# Patient Record
Sex: Male | Born: 1945 | Race: White | Hispanic: No | Marital: Single | Smoking: Current every day smoker
Health system: Southern US, Community
[De-identification: ages and names within clinical notes are randomized; demographics above are authoritative.]

## PROBLEM LIST (undated history)

## (undated) DIAGNOSIS — K219 Gastro-esophageal reflux disease without esophagitis: Secondary | ICD-10-CM

## (undated) DIAGNOSIS — N529 Male erectile dysfunction, unspecified: Secondary | ICD-10-CM

## (undated) DIAGNOSIS — Z8719 Personal history of other diseases of the digestive system: Secondary | ICD-10-CM

## (undated) DIAGNOSIS — I499 Cardiac arrhythmia, unspecified: Secondary | ICD-10-CM

## (undated) DIAGNOSIS — E785 Hyperlipidemia, unspecified: Secondary | ICD-10-CM

## (undated) DIAGNOSIS — C801 Malignant (primary) neoplasm, unspecified: Secondary | ICD-10-CM

## (undated) DIAGNOSIS — M1990 Unspecified osteoarthritis, unspecified site: Secondary | ICD-10-CM

## (undated) DIAGNOSIS — M419 Scoliosis, unspecified: Secondary | ICD-10-CM

## (undated) HISTORY — DX: Unspecified osteoarthritis, unspecified site: M19.90

## (undated) HISTORY — DX: Gastro-esophageal reflux disease without esophagitis: K21.9

## (undated) HISTORY — PX: TONSILLECTOMY: SUR1361

## (undated) HISTORY — DX: Malignant (primary) neoplasm, unspecified: C80.1

## (undated) HISTORY — DX: Cardiac arrhythmia, unspecified: I49.9

## (undated) HISTORY — DX: Male erectile dysfunction, unspecified: N52.9

## (undated) HISTORY — DX: Hyperlipidemia, unspecified: E78.5

## (undated) HISTORY — DX: Scoliosis, unspecified: M41.9

---

## 1989-06-25 HISTORY — PX: HERNIA REPAIR: SHX51

## 1999-07-03 ENCOUNTER — Encounter: Admission: RE | Admit: 1999-07-03 | Discharge: 1999-07-03 | Payer: Self-pay | Admitting: Family Medicine

## 1999-07-03 ENCOUNTER — Encounter: Payer: Self-pay | Admitting: Family Medicine

## 2004-09-18 ENCOUNTER — Ambulatory Visit: Payer: Self-pay | Admitting: Gastroenterology

## 2004-10-10 HISTORY — PX: COLONOSCOPY: SHX174

## 2004-10-12 ENCOUNTER — Ambulatory Visit: Payer: Self-pay | Admitting: Gastroenterology

## 2004-10-12 HISTORY — PX: COLONOSCOPY: SHX174

## 2011-08-09 ENCOUNTER — Encounter: Payer: Self-pay | Admitting: Gastroenterology

## 2014-07-23 ENCOUNTER — Encounter: Payer: Self-pay | Admitting: Gastroenterology

## 2014-07-29 ENCOUNTER — Encounter: Payer: Self-pay | Admitting: Gastroenterology

## 2014-09-27 ENCOUNTER — Ambulatory Visit (AMBULATORY_SURGERY_CENTER): Payer: Self-pay | Admitting: *Deleted

## 2014-09-27 VITALS — Ht 69.0 in | Wt 168.8 lb

## 2014-09-27 DIAGNOSIS — Z1211 Encounter for screening for malignant neoplasm of colon: Secondary | ICD-10-CM

## 2014-09-27 MED ORDER — NA SULFATE-K SULFATE-MG SULF 17.5-3.13-1.6 GM/177ML PO SOLN: 1.0000 | Freq: Once | ORAL | Status: DC

## 2014-09-27 NOTE — Progress Notes (Signed)
No home 02 use No egg or soy allergy No issues with past sedation except post op N/V No diet pills emmi video to e mail

## 2014-10-11 ENCOUNTER — Encounter: Payer: Self-pay | Admitting: Gastroenterology

## 2014-10-11 ENCOUNTER — Ambulatory Visit (AMBULATORY_SURGERY_CENTER): Payer: Medicare HMO | Admitting: Gastroenterology

## 2014-10-11 VITALS — BP 110/78 | HR 63 | Temp 96.5°F | Resp 18

## 2014-10-11 DIAGNOSIS — D124 Benign neoplasm of descending colon: Secondary | ICD-10-CM

## 2014-10-11 DIAGNOSIS — K5730 Diverticulosis of large intestine without perforation or abscess without bleeding: Secondary | ICD-10-CM | POA: Diagnosis not present

## 2014-10-11 DIAGNOSIS — Z1211 Encounter for screening for malignant neoplasm of colon: Secondary | ICD-10-CM | POA: Diagnosis not present

## 2014-10-11 MED ORDER — SODIUM CHLORIDE 0.9 % IV SOLN: 500.0000 mL | INTRAVENOUS | Status: DC

## 2014-10-11 NOTE — Progress Notes (Signed)
Report to PACU, RN, vss, BBS= Clear.  

## 2014-10-11 NOTE — Progress Notes (Signed)
Called to room to assist during endoscopic procedure.  Patient ID and intended procedure confirmed with present staff. Received instructions for my participation in the procedure from the performing physician.  

## 2014-10-11 NOTE — Op Note (Signed)
[place]  [address]. [place], [address]   COLONOSCOPY PROCEDURE REPORT  PATIENT: Rubel, Heckard  MR#: [id number] BIRTHDATE: [birth year] , 68  yrs. old GENDER: male ENDOSCOPIST: Inda Castle, MD REFERRED BY: PROCEDURE DATE:  10/11/2014 PROCEDURE:   Colonoscopy, screening and Colonoscopy with snare polypectomy First Screening Colonoscopy - Avg.  risk and is 50 yrs.  old or older - No.  Prior Negative Screening - Now for repeat screening. 10 or more years since last screening  History of Adenoma - Now for follow-up colonoscopy & has been > or = to 3 yrs.  N/A ASA CLASS:   Class II INDICATIONS:Colorectal Neoplasm Risk Assessment for this procedure is average risk. MEDICATIONS: Monitored anesthesia care and Propofol 200 mg IV  DESCRIPTION OF PROCEDURE:   After the risks benefits and alternatives of the procedure were thoroughly explained, informed consent was obtained.  The digital rectal exam revealed external hemorrhoids.   The [id number]  endoscope was introduced through the anus and advanced to the cecum, which was identified by both the appendix and ileocecal valve. No adverse events experienced.   The quality of the prep was (Suprep was used) excellent.  The instrument was then slowly withdrawn as the colon was fully examined.      COLON FINDINGS: There was moderate diverticulosis noted in the transverse colon, descending colon, and sigmoid colon.   A sessile polyp measuring 7 mm in size was found in the descending colon.  A polypectomy was performed with a cold snare.  The resection was complete, the polyp tissue was completely retrieved and sent to histology.  Retroflexed views revealed no abnormalities. The time to cecum = 4.2 Withdrawal time = 8.6   The scope was withdrawn and the procedure completed. COMPLICATIONS: There were no immediate complications.  ENDOSCOPIC IMPRESSION: 1.   There was moderate diverticulosis noted in the  transverse colon, descending colon, and sigmoid colon 2.   Sessile polyp was found in the descending colon; polypectomy was performed with a cold snare 3.   External hemorrhoids  RECOMMENDATIONS: If the polyp(s) removed today are proven to be adenomatous (pre-cancerous) polyps, you will need a repeat colonoscopy in 5 years.  Otherwise you should continue to follow colorectal cancer screening guidelines for "routine risk" patients with colonoscopy in 10 years.  You will receive a letter within 1-2 weeks with the results of your biopsy as well as final recommendations.  Please call my office if you have not received a letter after 3 weeks.  eSigned:  Inda Castle, MD 10/11/2014 9:35 AM   cc: Juanita Craver, MD   PATIENT NAME:  Fremon, Zacharia MR#: [id number]

## 2014-10-11 NOTE — Patient Instructions (Signed)
YOU HAD AN ENDOSCOPIC PROCEDURE TODAY AT THE [facility]:   Refer to the procedure report that was given to you for any specific questions about what was found during the examination.  If the procedure report does not answer your questions, please call your gastroenterologist to clarify.  If you requested that your care partner not be given the details of your procedure findings, then the procedure report has been included in a sealed envelope for you to review at your convenience later.  YOU SHOULD EXPECT: Some feelings of bloating in the abdomen. Passage of more gas than usual.  Walking can help get rid of the air that was put into your GI tract during the procedure and reduce the bloating. If you had a lower endoscopy (such as a colonoscopy or flexible sigmoidoscopy) you may notice spotting of blood in your stool or on the toilet paper. If you underwent a bowel prep for your procedure, you may not have a normal bowel movement for a few days.  Please Note:  You might notice some irritation and congestion in your nose or some drainage.  This is from the oxygen used during your procedure.  There is no need for concern and it should clear up in a day or so.  SYMPTOMS TO REPORT IMMEDIATELY:   Following lower endoscopy (colonoscopy or flexible sigmoidoscopy):  Excessive amounts of blood in the stool  Significant tenderness or worsening of abdominal pains  Swelling of the abdomen that is new, acute  Fever of 100F or higher   For urgent or emergent issues, a gastroenterologist can be reached at any hour by calling [phone].   DIET: Your first meal following the procedure should be a small meal and then it is ok to progress to your normal diet. Heavy or fried foods are harder to digest and may make you feel nauseous or bloated.  Likewise, meals heavy in dairy and vegetables can increase bloating.  Drink plenty of fluids but you should avoid alcoholic beverages for 24  hours.  ACTIVITY:  You should plan to take it easy for the rest of today and you should NOT DRIVE or use heavy machinery until tomorrow (because of the sedation medicines used during the test).    FOLLOW UP: Our staff will call the number listed on your records the next business day following your procedure to check on you and address any questions or concerns that you may have regarding the information given to you following your procedure. If we do not reach you, we will leave a message.  However, if you are feeling well and you are not experiencing any problems, there is no need to return our call.  We will assume that you have returned to your regular daily activities without incident.  If any biopsies were taken you will be contacted by phone or by letter within the next 1-3 weeks.  Please call us at [phone] if you have not heard about the biopsies in 3 weeks.    SIGNATURES/CONFIDENTIALITY: You and/or your care partner have signed paperwork which will be entered into your electronic medical record.  These signatures attest to the fact that that the information above on your After Visit Summary has been reviewed and is understood.  Full responsibility of the confidentiality of this discharge information lies with you and/or your care-partner. 

## 2014-10-12 ENCOUNTER — Telehealth: Payer: Self-pay | Admitting: *Deleted

## 2014-10-12 NOTE — Telephone Encounter (Signed)
  Follow up Call-  Call back number 10/11/2014  Post procedure Call Back phone  # [phone]  Permission to leave phone message Yes     Patient questions:  Do you have a fever, pain , or abdominal swelling? No. Pain Score  0 *  Have you tolerated food without any problems? Yes.    Have you been able to return to your normal activities? Yes.    Do you have any questions about your discharge instructions: Diet   No. Medications  No. Follow up visit  No.  Do you have questions or concerns about your Care? No.  Actions: * If pain score is 4 or above: No action needed, pain <4.

## 2014-10-14 ENCOUNTER — Encounter: Payer: Self-pay | Admitting: Gastroenterology

## 2015-05-17 ENCOUNTER — Other Ambulatory Visit: Payer: Self-pay | Admitting: Orthopaedic Surgery

## 2015-05-17 DIAGNOSIS — M4150 Other secondary scoliosis, site unspecified: Secondary | ICD-10-CM

## 2015-06-05 ENCOUNTER — Ambulatory Visit
Admission: RE | Admit: 2015-06-05 | Discharge: 2015-06-05 | Disposition: A | Discharge status: Home / Self Care | Payer: Medicare HMO | Source: Ambulatory Visit | Attending: Orthopaedic Surgery | Admitting: Orthopaedic Surgery

## 2015-06-05 DIAGNOSIS — M4150 Other secondary scoliosis, site unspecified: Secondary | ICD-10-CM

## 2018-04-14 ENCOUNTER — Encounter (HOSPITAL_COMMUNITY): Payer: Self-pay

## 2018-04-14 NOTE — Pre-Procedure Instructions (Signed)
Aaron Klein  04/14/2018      [id number] - [place], [place] - [address] SEC OF [address] [place] [address] Phone: [phone] Fax: [phone]    Your procedure is scheduled on Oct. 25  Report to [facility] Admitting at 10:30 A.M.  Call this number if you have problems the morning of surgery:  [phone]   Remember:   Do not eat or drink after midnight.      Take these medicines the morning of surgery with A SIP OF WATER :              Gabapentin (neurontin)             Hydrocodone if needed             Pantoprazole (protonix)             Tramadol                  7 days prior to surgery STOP taking any Aspirin(unless otherwise instructed by your surgeon), Aleve, Naproxen, Ibuprofen, Motrin, Advil, Goody's, BC's, all herbal medications, fish oil, and all vitamins                      Follow your surgeon's instructions on when to stop Asprin.  If no instructions were given by your surgeon then you will need to call the office to get those instructions.                   Do not wear jewelry.  Do not wear lotions, powders, or perfumes, or deodorant.  Do not shave 48 hours prior to surgery.  Men may shave face and neck.  Do not bring valuables to the hospital.  [facility] is not responsible for any belongings or valuables.  Contacts, dentures or bridgework may not be worn into surgery.  Leave your suitcase in the car.  After surgery it may be brought to your room.  For patients admitted to the hospital, discharge time will be determined by your treatment team.  Patients discharged the day of surgery will not be allowed to drive home.    Special instructions:   - Preparing For Surgery  Before surgery, you can play an important role. Because skin is not sterile, your skin needs to be as free of germs as possible. You can reduce the number of germs on your skin by washing  with CHG (chlorahexidine gluconate) Soap before surgery.  CHG is an antiseptic cleaner which kills germs and bonds with the skin to continue killing germs even after washing.    Oral Hygiene is also important to reduce your risk of infection.  Remember - BRUSH YOUR TEETH THE MORNING OF SURGERY WITH YOUR REGULAR TOOTHPASTE  Please do not use if you have an allergy to CHG or antibacterial soaps. If your skin becomes reddened/irritated stop using the CHG.  Do not shave (including legs and underarms) for at least 48 hours prior to first CHG shower. It is OK to shave your face.  Please follow these instructions carefully.   1. Shower the NIGHT BEFORE SURGERY and the MORNING OF SURGERY with CHG.   2. If you chose to wash your hair, wash your hair first as usual with your normal shampoo.  3. After you shampoo, rinse your hair and body thoroughly to remove the shampoo.  4. Use CHG as you would any other liquid soap. You can apply CHG directly to the skin and wash gently with a scrungie or a clean washcloth.   5. Apply the CHG Soap to your body ONLY FROM THE NECK DOWN.  Do not use on open wounds or open sores. Avoid contact with your eyes, ears, mouth and genitals (private parts). Wash Face and genitals (private parts)  with your normal soap.  6. Wash thoroughly, paying special attention to the area where your surgery will be performed.  7. Thoroughly rinse your body with warm water from the neck down.  8. DO NOT shower/wash with your normal soap after using and rinsing off the CHG Soap.  9. Pat yourself dry with a CLEAN TOWEL.  10. Wear CLEAN PAJAMAS to bed the night before surgery, wear comfortable clothes the morning of surgery  11. Place CLEAN SHEETS on your bed the night of your first shower and DO NOT SLEEP WITH PETS.    Day of Surgery:  Do not apply any deodorants/lotions.  Please wear clean clothes to the hospital/surgery center.   Remember to brush your teeth WITH YOUR REGULAR  TOOTHPASTE.    Please read over the following fact sheets that you were given.

## 2018-04-15 ENCOUNTER — Encounter (HOSPITAL_COMMUNITY)
Admission: RE | Admit: 2018-04-15 | Discharge: 2018-04-15 | Disposition: A | Discharge status: Home / Self Care | Payer: Medicare HMO | Source: Ambulatory Visit | Attending: Otolaryngology | Admitting: Otolaryngology

## 2018-04-15 ENCOUNTER — Encounter (HOSPITAL_COMMUNITY): Payer: Self-pay

## 2018-04-15 ENCOUNTER — Other Ambulatory Visit: Payer: Self-pay

## 2018-04-15 DIAGNOSIS — N529 Male erectile dysfunction, unspecified: Secondary | ICD-10-CM | POA: Diagnosis not present

## 2018-04-15 DIAGNOSIS — Z01812 Encounter for preprocedural laboratory examination: Secondary | ICD-10-CM

## 2018-04-15 DIAGNOSIS — J383 Other diseases of vocal cords: Secondary | ICD-10-CM | POA: Diagnosis present

## 2018-04-15 DIAGNOSIS — C320 Malignant neoplasm of glottis: Secondary | ICD-10-CM | POA: Diagnosis not present

## 2018-04-15 DIAGNOSIS — Z8249 Family history of ischemic heart disease and other diseases of the circulatory system: Secondary | ICD-10-CM | POA: Diagnosis not present

## 2018-04-15 DIAGNOSIS — Z7982 Long term (current) use of aspirin: Secondary | ICD-10-CM | POA: Diagnosis not present

## 2018-04-15 DIAGNOSIS — E7800 Pure hypercholesterolemia, unspecified: Secondary | ICD-10-CM | POA: Diagnosis not present

## 2018-04-15 DIAGNOSIS — M419 Scoliosis, unspecified: Secondary | ICD-10-CM | POA: Diagnosis not present

## 2018-04-15 DIAGNOSIS — K219 Gastro-esophageal reflux disease without esophagitis: Secondary | ICD-10-CM | POA: Diagnosis not present

## 2018-04-15 DIAGNOSIS — F17210 Nicotine dependence, cigarettes, uncomplicated: Secondary | ICD-10-CM | POA: Diagnosis not present

## 2018-04-15 DIAGNOSIS — Z79899 Other long term (current) drug therapy: Secondary | ICD-10-CM | POA: Diagnosis not present

## 2018-04-15 HISTORY — DX: Personal history of other diseases of the digestive system: Z87.19

## 2018-04-15 LAB — CBC
HCT: 42 % (ref 39.0–52.0)
Hemoglobin: 13.6 g/dL (ref 13.0–17.0)
MCH: 29.4 pg (ref 26.0–34.0)
MCHC: 32.4 g/dL (ref 30.0–36.0)
MCV: 90.9 fL (ref 80.0–100.0)
NRBC: 0 % (ref 0.0–0.2)
Platelets: 256 10*3/uL (ref 150–400)
RBC: 4.62 MIL/uL (ref 4.22–5.81)
RDW: 12.7 % (ref 11.5–15.5)
WBC: 6.9 10*3/uL (ref 4.0–10.5)

## 2018-04-15 NOTE — Progress Notes (Signed)
PCP - Bing Matter, PA-C Cardiologist - denies  Chest x-ray - N/A EKG - N/A Stress Test -10+ years ago  ECHO - denies Cardiac Cath - denies  Sleep Study - denies Aspirin Instructions: reported stopping ASA a week ago.   Anesthesia review: No  Patient denies shortness of breath, fever, cough and chest pain at PAT appointment   Patient verbalized understanding of instructions that were given to them at the PAT appointment. Patient was also instructed that they will need to review over the PAT instructions again at home before surgery.

## 2018-04-18 ENCOUNTER — Ambulatory Visit (HOSPITAL_COMMUNITY): Payer: Medicare HMO | Admitting: Anesthesiology

## 2018-04-18 ENCOUNTER — Other Ambulatory Visit: Payer: Self-pay

## 2018-04-18 ENCOUNTER — Ambulatory Visit (HOSPITAL_COMMUNITY)
Admission: RE | Admit: 2018-04-18 | Discharge: 2018-04-18 | Disposition: A | Discharge status: Home / Self Care | Payer: Medicare HMO | Source: Ambulatory Visit | Attending: Otolaryngology | Admitting: Otolaryngology

## 2018-04-18 ENCOUNTER — Encounter (HOSPITAL_COMMUNITY): Payer: Self-pay

## 2018-04-18 ENCOUNTER — Encounter (HOSPITAL_COMMUNITY)
Admission: RE | Disposition: A | Discharge status: Home / Self Care | Payer: Self-pay | Source: Ambulatory Visit | Attending: Otolaryngology

## 2018-04-18 DIAGNOSIS — M419 Scoliosis, unspecified: Secondary | ICD-10-CM | POA: Insufficient documentation

## 2018-04-18 DIAGNOSIS — J383 Other diseases of vocal cords: Secondary | ICD-10-CM | POA: Insufficient documentation

## 2018-04-18 DIAGNOSIS — F17210 Nicotine dependence, cigarettes, uncomplicated: Secondary | ICD-10-CM | POA: Insufficient documentation

## 2018-04-18 DIAGNOSIS — E7800 Pure hypercholesterolemia, unspecified: Secondary | ICD-10-CM | POA: Insufficient documentation

## 2018-04-18 DIAGNOSIS — Z79899 Other long term (current) drug therapy: Secondary | ICD-10-CM | POA: Insufficient documentation

## 2018-04-18 DIAGNOSIS — C320 Malignant neoplasm of glottis: Secondary | ICD-10-CM | POA: Diagnosis not present

## 2018-04-18 DIAGNOSIS — N529 Male erectile dysfunction, unspecified: Secondary | ICD-10-CM | POA: Insufficient documentation

## 2018-04-18 DIAGNOSIS — Z7982 Long term (current) use of aspirin: Secondary | ICD-10-CM | POA: Insufficient documentation

## 2018-04-18 DIAGNOSIS — K219 Gastro-esophageal reflux disease without esophagitis: Secondary | ICD-10-CM | POA: Diagnosis not present

## 2018-04-18 DIAGNOSIS — Z8249 Family history of ischemic heart disease and other diseases of the circulatory system: Secondary | ICD-10-CM | POA: Insufficient documentation

## 2018-04-18 HISTORY — PX: MICROLARYNGOSCOPY: SHX5208

## 2018-04-18 LAB — BASIC METABOLIC PANEL
Anion gap: 8 (ref 5–15)
BUN: 15 mg/dL (ref 8–23)
CALCIUM: 9.4 mg/dL (ref 8.9–10.3)
CO2: 25 mmol/L (ref 22–32)
Chloride: 102 mmol/L (ref 98–111)
Creatinine, Ser: 1.27 mg/dL — ABNORMAL HIGH (ref 0.61–1.24)
GFR calc Af Amer: >60 mL/min (ref >60)
GFR, EST NON AFRICAN AMERICAN: 54 mL/min — AB (ref >60)
GLUCOSE: 92 mg/dL (ref 70–99)
Potassium: 5 mmol/L (ref 3.5–5.1)
Sodium: 135 mmol/L (ref 135–145)

## 2018-04-18 SURGERY — MICROLARYNGOSCOPY
Anesthesia: General | Site: Throat

## 2018-04-18 MED ORDER — SUGAMMADEX SODIUM 200 MG/2ML IV SOLN
INTRAVENOUS | Status: DC | PRN
  Administered 2018-04-18: 200 mg via INTRAVENOUS

## 2018-04-18 MED ORDER — ALBUTEROL SULFATE HFA 108 (90 BASE) MCG/ACT IN AERS
INHALATION_SPRAY | RESPIRATORY_TRACT | Status: AC
  Filled 2018-04-18: qty 6.7

## 2018-04-18 MED ORDER — PROPOFOL 10 MG/ML IV BOLUS
INTRAVENOUS | Status: DC | PRN
  Administered 2018-04-18: 100 mg via INTRAVENOUS

## 2018-04-18 MED ORDER — LACTATED RINGERS IV SOLN
INTRAVENOUS | Status: DC
  Administered 2018-04-18: 11:00:00 via INTRAVENOUS

## 2018-04-18 MED ORDER — ONDANSETRON HCL 4 MG/2ML IJ SOLN
INTRAMUSCULAR | Status: AC
  Filled 2018-04-18: qty 2

## 2018-04-18 MED ORDER — EPINEPHRINE 30 MG/30ML IJ SOLN
INTRAMUSCULAR | Status: DC | PRN
  Administered 2018-04-18: 1 mg

## 2018-04-18 MED ORDER — PROMETHAZINE HCL 25 MG/ML IJ SOLN: 6.2500 mg | INTRAMUSCULAR | Status: DC | PRN

## 2018-04-18 MED ORDER — LIDOCAINE HCL (PF) 2 % IJ SOLN
INTRAMUSCULAR | Status: DC | PRN
  Administered 2018-04-18: 5 mL

## 2018-04-18 MED ORDER — FENTANYL CITRATE (PF) 100 MCG/2ML IJ SOLN
INTRAMUSCULAR | Status: DC | PRN
  Administered 2018-04-18: 50 ug via INTRAVENOUS

## 2018-04-18 MED ORDER — LIDOCAINE-EPINEPHRINE 2 %-1:100000 IJ SOLN
INTRAMUSCULAR | Status: DC | PRN
  Administered 2018-04-18: 10.5 mL via OPHTHALMIC

## 2018-04-18 MED ORDER — LIDOCAINE 2% (20 MG/ML) 5 ML SYRINGE
INTRAMUSCULAR | Status: AC
  Filled 2018-04-18: qty 5

## 2018-04-18 MED ORDER — DEXAMETHASONE SODIUM PHOSPHATE 10 MG/ML IJ SOLN
INTRAMUSCULAR | Status: AC
  Filled 2018-04-18: qty 1

## 2018-04-18 MED ORDER — PROPOFOL 10 MG/ML IV BOLUS
INTRAVENOUS | Status: AC
  Filled 2018-04-18: qty 20

## 2018-04-18 MED ORDER — OXYMETAZOLINE HCL 0.05 % NA SOLN
NASAL | Status: AC
  Filled 2018-04-18: qty 15

## 2018-04-18 MED ORDER — LIDOCAINE-EPINEPHRINE 2 %-1:100000 IJ SOLN
INTRAMUSCULAR | Status: AC
  Filled 2018-04-18: qty 1

## 2018-04-18 MED ORDER — EPINEPHRINE PF 1 MG/ML IJ SOLN
INTRAMUSCULAR | Status: AC
  Filled 2018-04-18: qty 1

## 2018-04-18 MED ORDER — ONDANSETRON HCL 4 MG/2ML IJ SOLN
INTRAMUSCULAR | Status: DC | PRN
  Administered 2018-04-18: 4 mg via INTRAVENOUS

## 2018-04-18 MED ORDER — ROCURONIUM BROMIDE 50 MG/5ML IV SOSY
PREFILLED_SYRINGE | INTRAVENOUS | Status: DC | PRN
  Administered 2018-04-18: 30 mg via INTRAVENOUS

## 2018-04-18 MED ORDER — BUPIVACAINE HCL (PF) 0.75 % IJ SOLN
INTRAMUSCULAR | Status: AC
  Filled 2018-04-18: qty 10

## 2018-04-18 MED ORDER — ROCURONIUM BROMIDE 50 MG/5ML IV SOSY
PREFILLED_SYRINGE | INTRAVENOUS | Status: AC
  Filled 2018-04-18: qty 5

## 2018-04-18 MED ORDER — LIDOCAINE HCL 2 % IJ SOLN
INTRAMUSCULAR | Status: AC
  Filled 2018-04-18: qty 20

## 2018-04-18 MED ORDER — FENTANYL CITRATE (PF) 100 MCG/2ML IJ SOLN: 25.0000 ug | INTRAMUSCULAR | Status: DC | PRN

## 2018-04-18 MED ORDER — OXYMETAZOLINE HCL 0.05 % NA SOLN
NASAL | Status: DC | PRN
  Administered 2018-04-18: 1

## 2018-04-18 MED ORDER — EPINEPHRINE HCL (NASAL) 0.1 % NA SOLN
NASAL | Status: AC
  Filled 2018-04-18: qty 30

## 2018-04-18 MED ORDER — SUCCINYLCHOLINE CHLORIDE 200 MG/10ML IV SOSY
PREFILLED_SYRINGE | INTRAVENOUS | Status: AC
  Filled 2018-04-18: qty 10

## 2018-04-18 MED ORDER — ALBUTEROL SULFATE HFA 108 (90 BASE) MCG/ACT IN AERS
INHALATION_SPRAY | RESPIRATORY_TRACT | Status: DC | PRN
  Administered 2018-04-18: 2 via RESPIRATORY_TRACT

## 2018-04-18 MED ORDER — FENTANYL CITRATE (PF) 250 MCG/5ML IJ SOLN
INTRAMUSCULAR | Status: AC
  Filled 2018-04-18: qty 5

## 2018-04-18 MED ORDER — LIDOCAINE 2% (20 MG/ML) 5 ML SYRINGE
INTRAMUSCULAR | Status: DC | PRN
  Administered 2018-04-18: 60 mg via INTRAVENOUS

## 2018-04-18 MED ORDER — SUCCINYLCHOLINE CHLORIDE 20 MG/ML IJ SOLN
INTRAMUSCULAR | Status: DC | PRN
  Administered 2018-04-18: 100 mg via INTRAVENOUS

## 2018-04-18 MED ORDER — BUPIVACAINE HCL (PF) 0.75 % IJ SOLN
INTRAMUSCULAR | Status: AC
  Filled 2018-04-18: qty 30

## 2018-04-18 MED ORDER — 0.9 % SODIUM CHLORIDE (POUR BTL) OPTIME
TOPICAL | Status: DC | PRN
  Administered 2018-04-18: 1000 mL

## 2018-04-18 MED ORDER — HYALURONIDASE HUMAN 150 UNIT/ML IJ SOLN
INTRAMUSCULAR | Status: AC
  Filled 2018-04-18: qty 1

## 2018-04-18 MED ORDER — DEXAMETHASONE SODIUM PHOSPHATE 10 MG/ML IJ SOLN
INTRAMUSCULAR | Status: DC | PRN
  Administered 2018-04-18: 10 mg via INTRAVENOUS

## 2018-04-18 SURGICAL SUPPLY — 36 items
BALLN PULM 15 16.5 18X75 (BALLOONS)
BALLOON PULM 15 16.5 18X75 (BALLOONS) IMPLANT
CANISTER SUCT 3000ML PPV (MISCELLANEOUS) ×2 IMPLANT
CONT SPEC 4OZ CLIKSEAL STRL BL (MISCELLANEOUS) ×1 IMPLANT
COVER BACK TABLE 60X90IN (DRAPES) ×2 IMPLANT
COVER MAYO STAND STRL (DRAPES) ×2 IMPLANT
COVER WAND RF STERILE (DRAPES) ×2 IMPLANT
DRAPE HALF SHEET 40X57 (DRAPES) ×3 IMPLANT
DRSG TELFA 3X8 NADH (GAUZE/BANDAGES/DRESSINGS) ×2 IMPLANT
GAUZE 4X4 16PLY RFD (DISPOSABLE) ×2 IMPLANT
GLOVE BIO SURGEON STRL SZ 6.5 (GLOVE) ×2 IMPLANT
GLOVE BIO SURGEON STRL SZ8 (GLOVE) ×1 IMPLANT
GLOVE BIO SURGEON STRL SZ8.5 (GLOVE) ×1 IMPLANT
GLOVE BIOGEL PI IND STRL 8.5 (GLOVE) IMPLANT
GLOVE BIOGEL PI INDICATOR 8.5 (GLOVE) ×1
KIT BASIN OR (CUSTOM PROCEDURE TRAY) ×2 IMPLANT
KIT TURNOVER KIT B (KITS) IMPLANT
NDL 18GX1X1/2 (RX/OR ONLY) (NEEDLE) IMPLANT
NDL HYPO 25GX1X1/2 BEV (NEEDLE) ×1 IMPLANT
NEEDLE 18GX1X1/2 (RX/OR ONLY) (NEEDLE) ×2 IMPLANT
NEEDLE HYPO 25GX1X1/2 BEV (NEEDLE) ×6 IMPLANT
NS IRRIG 1000ML POUR BTL (IV SOLUTION) ×2 IMPLANT
PAD ARMBOARD 7.5X6 YLW CONV (MISCELLANEOUS) ×4 IMPLANT
PAD DRESSING TELFA 3X8 NADH (GAUZE/BANDAGES/DRESSINGS) IMPLANT
PATTIES SURGICAL .5X1.5 (GAUZE/BANDAGES/DRESSINGS) ×3 IMPLANT
PUNCH BIOPSY 4MM (MISCELLANEOUS)
PUNCH BIOPSY DISP 4 (MISCELLANEOUS) IMPLANT
SET COLLECT BLD 21X3/4 12 PB (MISCELLANEOUS) ×1 IMPLANT
SOLUTION ANTI FOG 6CC (MISCELLANEOUS) ×2 IMPLANT
SYR 20CC LL (SYRINGE) ×1 IMPLANT
SYR CONTROL 10ML LL (SYRINGE) ×4 IMPLANT
SYR TB 1ML LUER SLIP (SYRINGE) ×3 IMPLANT
TOWEL NATURAL 6PK STERILE (DISPOSABLE) ×2 IMPLANT
TUBE CONNECTING 12X1/4 (SUCTIONS) ×3 IMPLANT
TUBE CONNECTING 20X1/4 (TUBING) ×1 IMPLANT
WATER STERILE IRR 1000ML POUR (IV SOLUTION) ×2 IMPLANT

## 2018-04-18 NOTE — Op Note (Signed)
DATE OF PROCEDURE:  04/18/2018    PRE-OPERATIVE DIAGNOSIS:  BILATERAL VOCAL CORD LESIONS    POST-OPERATIVE DIAGNOSIS:  Same    PROCEDURE(S): Suspension direct laryngoscopy with operating telescope Bilateral vocal cord biopsies   SURGEON:  Gavin Pound, MD    ASSISTANT(S): none    ANESTHESIA: General endotracheal anesthesia      ESTIMATED BLOOD LOSS: scant   SPECIMENS: Right     COMPLICATIONS: None    OPERATIVE FINDINGS: There were no lesions of the oral cavity, oropharynx, supraglottic airway.  There are bilateral vocal cord lesions right greater than left.  The right true vocal cord lesion extended to the anterior commissure along the superior and free edge of the vocal cord extending back to the vocal process.  There were no lesions in the proximal trachea.  No lesions in the proximal esophagus.  The left true vocal cord demonstrated mildly thickened mucosa along the free edge extending up to the anterior commissure as well.  Mobility of vocal cord remains preserved.     OPERATIVE DETAILS: The patient was brought to the operating room placed in supine position general anesthesia was induced by anesthesia.  Bed was rotated 90 degrees.  Eye pads, eye tap, and telfa was used to protect the teeth.  The patient's face was covered in a towel.  Next, the Dedo laryngoscope was introduced.  Examination was carried out of the oral cavity and oropharynx with findings as noted above.  The larynx was then exposed and attached for suspension on the Mayo stand.  The operating telescope was then utilized to take photo- documentation of the lesions.  Next, epinephrine was injected into the bilateral vocal cord lesions.  The right vocal cord lesion was then grasped with up cups and excised partially with up scissors.  The process seem to involve the majority of the right true cord and to preserve the patient's vocal abilities, only a biopsy was taken.  A similar process was  used for the left vocal cord.  Lesions were passed off the field and labeled appropriately.  Afrin-soaked patties were then applied to the vocal cords to achieve hemostasis.  All instrumentation was then removed.  The patient was then awakened, extubated, and transported to PACU without difficulties.

## 2018-04-18 NOTE — Anesthesia Procedure Notes (Signed)
Procedure Name: Intubation Date/Time: 04/18/2018 12:49 PM Performed by: Scheryl Darter, CRNA Pre-anesthesia Checklist: Patient identified, Emergency Drugs available, Suction available and Patient being monitored Patient Re-evaluated:Patient Re-evaluated prior to induction Oxygen Delivery Method: Circle System Utilized Preoxygenation: Pre-oxygenation with 100% oxygen Induction Type: IV induction Ventilation: Mask ventilation without difficulty Laryngoscope Size: Mac and 4 Grade View: Grade I Tube type: Oral Tube size: 5.0 (per surgeon request) mm Number of attempts: 1 Airway Equipment and Method: Stylet and Oral airway Placement Confirmation: ETT inserted through vocal cords under direct vision,  positive ETCO2 and breath sounds checked- equal and bilateral Secured at: 20 cm Tube secured with: Tape Dental Injury: Teeth and Oropharynx as per pre-operative assessment  Comments: Intubated by [facility]

## 2018-04-18 NOTE — Discharge Instructions (Addendum)
Voice rest for 3 days.  This means no talking, no whispering.  Please write if you need to communicate.  Call if you develop any significant shortness of breath. You may resume aspirin at any time. Tylenol and ibuprofen as needed for pain. Diet as tolerated. Avoid tobacco products of any kind.  Stay hydrated.  Drink plenty of water.

## 2018-04-18 NOTE — Transfer of Care (Signed)
2Immediate Anesthesia Transfer of Care Note  Patient: Aaron Klein  Procedure(s) Performed: SUSPENSION MICRO-DIRECT LARYNGOSTOPY WITH BIOPSY OF VOCAL CORD LESIONS (N/A Throat)  Patient Location: PACU  Anesthesia Type:General  Level of Consciousness: awake, alert , oriented and sedated  Airway & Oxygen Therapy: Patient Spontanous Breathing and Patient connected to nasal cannula oxygen  Post-op Assessment: Report given to RN, Post -op Vital signs reviewed and stable and Patient moving all extremities  Post vital signs: Reviewed and stable  Last Vitals:  Vitals Value Taken Time  BP 137/83 04/18/2018  1:29 PM  Temp 36.3 C 04/18/2018  1:29 PM  Pulse 77 04/18/2018  1:36 PM  Resp 17 04/18/2018  1:36 PM  SpO2 95 % 04/18/2018  1:36 PM  Vitals shown include unvalidated device data.  Last Pain:  Vitals:   04/18/18 1329  TempSrc:   PainSc: 0-No pain         Complications: No apparent anesthesia complications

## 2018-04-18 NOTE — H&P (Signed)
The surgical history remains accurate and without interval change. The condition still exists which makes the procedure necessary. The patient and/or family is aware of their condition and has been informed of the risks and benefits of surgery, as well as alternatives. All parties have elected to proceed with surgery.   Surgical plan: SMDL with biopsy with vocal cord lesions  Otolaryngology Return Patient Note  Subjective: Aaron Klein is a 72 y.o. male seen in follow up today. 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 Significant vocal improvement. Working on cutting back on tobacco use. He denies neck masses, difficulty swallowing, ear pain.  Past Medical History:  Diagnosis Date  . Back pain  . Erectile dysfunction  . GERD (gastroesophageal reflux disease)  . Hypercholesterolemia  . Scoliosis   Past Surgical History:  Procedure Laterality Date  . hernia  . TONSILLECTOMY   Family History  Problem Relation Age of Onset  . Diabetes Father  . Coronary artery disease Father   Social History   Socioeconomic History  . Marital status: Divorced  Spouse name: Not on file  . Number of children: Not on file  . Years of education: Not on file  . Highest education level: Not on file  Occupational History  . Not on file  Social Needs  . Financial resource strain: Not on file  . Food insecurity:  Worry: Not on file  Inability: Not on file  . Transportation needs:  Medical: Not on file  Non-medical: Not on file  Tobacco Use  . Smoking status: Current Every Day Smoker  Packs/day: 0.00  Types: Cigarettes  . Smokeless tobacco: Never Used  Substance and Sexual Activity  . Alcohol use: No  . Drug use: No  . Sexual activity: Not on file  Lifestyle  . Physical activity:  Days per week: Not on file  Minutes per session: Not on file  . Stress: Not on file  Relationships  . Social  connections:  Talks on phone: Not on file  Gets together: Not on file  Attends religious service: Not on file  Active member of club or organization: Not on file  Attends meetings of clubs or organizations: Not on file  Relationship status: Not on file  Other Topics Concern  . Not on file  Social History Narrative  . Not on file   No Known Allergies Updated Medication List:   albuterol 90 mcg/actuation inhaler  Sig - Route: Inhale 2 puffs into the lungs every 6 (six) hours as needed for Wheezing. - Inhalation  ascorbic acid, vitamin C, (VITAMIN C) 100 MG tablet  Sig - Route: Take 100 mg by mouth daily. - Oral  Class: Historical Med  aspirin 81 MG chewable tablet *ANTIPLATELET*  Sig - Route: Take 81 mg by mouth daily. - Oral  Class: Historical Med  atorvastatin (LIPITOR) 10 MG tablet  Sig - Route: Take 1 tablet (10 mg total) by mouth daily. - Oral  Renewals  Renewal provider: Williemae Natter    azithromycin (ZITHROMAX) 250 MG tablet  Sig: Take two tablets on the first day and then one tablet every day after.  cholecalciferol, vitamin D3, (VITAMIN D3) 1,000 unit capsule  Sig:  Class: Historical Med  flaxseed oil 1,000 mg Cap  Sig - Route: Take 3,600 mg by mouth. - Oral  Class: Historical Med  gabapentin (NEURONTIN) 100 MG capsule  Sig: TAKE 1 CAPSULE THREE TIMES DAILY  HYDROcodone-acetaminophen (NORCO) 10-325 mg per tablet  Sig: Take 1/2 to 1 tablet four times daily as needed  Earliest Fill Date: 03/31/2018  lactose-reduced food with fibr (QUINOA-KALE-HEMP) Liqd  Sig:  Class: Historical Med  lecithin 1,000 mg Chew  Sig - Route: Take by mouth. - Oral  Class: Historical Med  multivitamin (THERAGRAN) per tablet  Sig - Route: Take 1 tablet by mouth daily. - Oral  Class: Historical Med  nystatin (MYCOSTATIN) 100,000 unit/gram cream  Sig: ataa bid  OMEGA-3/DHA/EPA/FISH OIL (OMEGA-3 FATTY ACIDS-FISH OIL) 300-1,000 mg capsule  Sig - Route: Take 1 g by mouth daily. - Oral   Class: Historical Med  pantoprazole (PROTONIX) 40 MG tablet  Sig: TAKE 1 TABLET TWICE DAILY  Renewals  Renewal provider: Williemae Natter    sildenafil (VIAGRA) 100 MG tablet  Sig - Route: Take 1 tablet (100 mg total) by mouth daily as needed for Erectile Dysfunction. - Oral  traMADol (ULTRAM) 50 mg tablet  Sig: TAKE 1 TO 2 TABLETS EVERY 6 HOURS AS NEEDED  vitamin E 400 UNIT capsule  Sig: 400 Units 2 times daily.  Class: Historical Med    ROS A complete review of systems was conducted and was negative except as stated in the HPI.   Objective: Vitals:  04/08/18 1327  BP: (!) 180/111  Pulse: 90  Resp: 16  Height: 1.753 m (5\' 9" )  Weight: 74.8 kg (165 lb)  BMI (Calculated): 24.4   Physical Exam:  General Normocephalic, Awake, Alert  Eyes PERRL, no scleral icterus or conjunctival hemorrhage. EOMI.  Ears Right ear- canal patent, minimal cerumen. There is still mild, residual debris in the right ear. TM intact, no significant retractions, no middle ear effusion, normal landmarks.   Left ear- canal partially obstructed with cerumen. TM intact, no significant retractions, no middle ear effusion, normal landmarks.  Nose Patent, No polyps or masses seen.  Oral Pharynx No mucosal lesions or tumors seen.  Lymphatics No cervical lymphadenopathy  Endocrine No thyroidmegaly, no thyroid masses palpated  Cardio-vascular No cyanosis, cardiac auscultation - regular rate, no murmur  Pulmonary No audible stridor, Breathing easily with no labor.  Neuro Symmetric facial movement. Tongue protrudes in midline.  Psychiatry Appropriate affect and mood for clinic visit.  Skin No scars or lesions on face or neck.   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 This is very concerning for a malignancy in this chronic tobacco user. We discussed risks and benefits of biopsy today.

## 2018-04-18 NOTE — Anesthesia Postprocedure Evaluation (Signed)
Anesthesia Post Note  Patient: Aaron Klein  Procedure(s) Performed: SUSPENSION MICRO-DIRECT LARYNGOSTOPY WITH BIOPSY OF VOCAL CORD LESIONS (N/A Throat)     Patient location during evaluation: PACU Anesthesia Type: General Level of consciousness: awake and alert Pain management: pain level controlled Vital Signs Assessment: post-procedure vital signs reviewed and stable Respiratory status: spontaneous breathing, nonlabored ventilation, respiratory function stable and patient connected to nasal cannula oxygen Cardiovascular status: blood pressure returned to baseline and stable Postop Assessment: no apparent nausea or vomiting Anesthetic complications: no    Last Vitals:  Vitals:   04/18/18 1343 04/18/18 1350  BP: 139/88 (!) 141/87  Pulse: 76 83  Resp: 17 16  Temp: (!) 36.3 C   SpO2: 96% 96%    Last Pain:  Vitals:   04/18/18 1350  TempSrc:   PainSc: 0-No pain                 Torre Schaumburg S

## 2018-04-18 NOTE — Anesthesia Preprocedure Evaluation (Signed)
Anesthesia Evaluation  Patient identified by MRN, date of birth, ID band Patient awake    Reviewed: Allergy & Precautions, NPO status , Patient's Chart, lab work & pertinent test results  Airway Mallampati: II  TM Distance: >3 FB Neck ROM: Full    Dental no notable dental hx.    Pulmonary Current Smoker,    Pulmonary exam normal breath sounds clear to auscultation       Cardiovascular negative cardio ROS Normal cardiovascular exam Rhythm:Regular Rate:Normal     Neuro/Psych negative neurological ROS  negative psych ROS   GI/Hepatic Neg liver ROS, GERD  ,  Endo/Other  negative endocrine ROS  Renal/GU negative Renal ROS  negative genitourinary   Musculoskeletal negative musculoskeletal ROS (+)   Abdominal   Peds negative pediatric ROS (+)  Hematology negative hematology ROS (+)   Anesthesia Other Findings   Reproductive/Obstetrics negative OB ROS                             Anesthesia Physical Anesthesia Plan  ASA: II  Anesthesia Plan: General   Post-op Pain Management:    Induction: Intravenous  PONV Risk Score and Plan: 1 and Ondansetron, Dexamethasone and Treatment may vary due to age or medical condition  Airway Management Planned: Oral ETT  Additional Equipment:   Intra-op Plan:   Post-operative Plan: Extubation in OR  Informed Consent: I have reviewed the patients History and Physical, chart, labs and discussed the procedure including the risks, benefits and alternatives for the proposed anesthesia with the patient or authorized representative who has indicated his/her understanding and acceptance.   Dental advisory given  Plan Discussed with: CRNA and Surgeon  Anesthesia Plan Comments:         Anesthesia Quick Evaluation

## 2018-04-19 ENCOUNTER — Encounter (HOSPITAL_COMMUNITY): Payer: Self-pay | Admitting: Otolaryngology

## 2018-04-25 ENCOUNTER — Other Ambulatory Visit: Payer: Self-pay | Admitting: Otolaryngology

## 2018-04-25 DIAGNOSIS — C320 Malignant neoplasm of glottis: Secondary | ICD-10-CM

## 2018-04-29 ENCOUNTER — Ambulatory Visit
Admission: RE | Admit: 2018-04-29 | Discharge: 2018-04-29 | Disposition: A | Discharge status: Home / Self Care | Payer: Medicare HMO | Source: Ambulatory Visit | Attending: Otolaryngology | Admitting: Otolaryngology

## 2018-04-29 DIAGNOSIS — C320 Malignant neoplasm of glottis: Secondary | ICD-10-CM

## 2018-04-29 MED ORDER — IOPAMIDOL (ISOVUE-300) INJECTION 61%
75.0000 mL | Freq: Once | INTRAVENOUS | Status: AC | PRN
  Administered 2018-04-29: 75 mL via INTRAVENOUS

## 2018-05-05 ENCOUNTER — Telehealth: Payer: Self-pay | Admitting: *Deleted

## 2018-05-05 NOTE — Telephone Encounter (Signed)
Oncology Nurse Navigator Documentation  Placed introductory call to new referral patient Mr. Masoner.  Introduced myself as the H&N oncology nurse navigator that works with Dr. Isidore Moos to whom he has been referred by Dr. Blenda Nicely.  He confirmed understanding of referral; I provided him appt date/time of 11/12 with 7:15 arrival to register for 7:30 Nurse Eval followed by 8:00 consult with Dr. Isidore Moos.  Briefly explained my role as his navigator, indicated I would be joining him during tomorrow's appt.  Confirmed understanding of [place] location, explained arrival and RadOnc registration process.  Provided my contact information, encouraged him/her to call with questions/concerns. He verbalized understanding of information provided, expressed appreciation for my call.  Navigator Needs Assessment . Employment status:  Retired. . Support system:  Lives alone, has girlfriend and brother who live in [place]. . Transportation:  Drives self. Marland Kitchen PCP:  Yes, Dr. Deatra Ina. She is aware of dx.  PCD:  No

## 2018-05-05 NOTE — Progress Notes (Signed)
Head and Neck Cancer Location of Tumor / Histology:  04/18/18 Diagnosis 1. 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 Vocal cord, biopsy, left - HIGH GRADE DYSPLASIA  Patient presented months ago with symptoms of: He presented to Dr. Blenda Nicely on 03/18/18 with a history of hoarseness.   Biopsies of right true vocal cord revealed:  Invasive squamous cell carcinoma.  Nutrition Status Yes No Comments  Weight changes? []  [x]    Swallowing concerns? []  [x]    PEG? []  [x]     Referrals Yes No Comments  Social Work? []  [x]    Dentistry? []  [x]    Swallowing therapy? []  [x]    Nutrition? []  [x]    Med/Onc? []  [x]     Safety Issues Yes No Comments  Prior radiation? []  [x]    Pacemaker/ICD? []  [x]    Possible current pregnancy? []  [x]    Is the patient on methotrexate? []  [x]     Tobacco/Marijuana/Snuff/ETOH use: He quit smoking 04/25/18.  He does not drink alcohol.   Past/Anticipated interventions by otolaryngology, if any:  04/18/18 PROCEDURE(S): Suspension direct laryngoscopy with operating telescope Bilateral vocal cord biopsies SURGEON: Gavin Pound, MD  Past/Anticipated interventions by medical oncology, if any:  None   Current Complaints / other details:    BP 116/84   Pulse 75   Temp 98.7 F (37.1 C)   Ht 5\' 10"  (1.778 m)   Wt 167 lb 3.2 oz (75.8 kg)   SpO2 100% Comment: room air  BMI 23.99 kg/m   Wt Readings from Last 3 Encounters:  05/06/18 167 lb 3.2 oz (75.8 kg)  04/18/18 169 lb 3.2 oz (76.7 kg)  04/15/18 169 lb 3.2 oz (76.7 kg)

## 2018-05-06 ENCOUNTER — Ambulatory Visit
Admission: RE | Admit: 2018-05-06 | Discharge: 2018-05-06 | Disposition: A | Discharge status: Home / Self Care | Payer: Medicare HMO | Source: Ambulatory Visit | Attending: Radiation Oncology | Admitting: Radiation Oncology

## 2018-05-06 ENCOUNTER — Encounter: Payer: Self-pay | Admitting: *Deleted

## 2018-05-06 ENCOUNTER — Encounter: Payer: Self-pay | Admitting: Radiation Oncology

## 2018-05-06 ENCOUNTER — Other Ambulatory Visit: Payer: Self-pay

## 2018-05-06 VITALS — BP 116/84 | HR 75 | Temp 98.7°F | Ht 70.0 in | Wt 167.2 lb

## 2018-05-06 DIAGNOSIS — Z1329 Encounter for screening for other suspected endocrine disorder: Secondary | ICD-10-CM

## 2018-05-06 DIAGNOSIS — Z7982 Long term (current) use of aspirin: Secondary | ICD-10-CM | POA: Insufficient documentation

## 2018-05-06 DIAGNOSIS — C320 Malignant neoplasm of glottis: Secondary | ICD-10-CM

## 2018-05-06 DIAGNOSIS — Z79899 Other long term (current) drug therapy: Secondary | ICD-10-CM | POA: Insufficient documentation

## 2018-05-06 LAB — TSH: TSH: 1.63 u[IU]/mL (ref 0.320–4.118)

## 2018-05-06 MED ORDER — LARYNGOSCOPY SOLUTION RAD-ONC
15.0000 mL | Freq: Once | TOPICAL | Status: AC
  Administered 2018-05-06: 15 mL via TOPICAL
  Filled 2018-05-06: qty 15

## 2018-05-06 NOTE — Progress Notes (Signed)
Radiation Oncology         [phone] ________________________________  Initial Outpatient Consultation  Name: Aaron Klein MRN: [id number]  Date: 05/06/2018  DOB: 29-Apr-1946  HB:ZJIRCV, Baldemar Friday., PA-C  Helayne Seminole, MD   REFERRING PHYSICIAN: Helayne Seminole, MD  DIAGNOSIS:    ICD-10-CM   1. 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 Screening for hypothyroidism Z13.29 TSH  3. Malignant neoplasm of glottis ([place]) C32.0    Cancer Staging Malignant neoplasm of glottis ([place]) Staging form: Larynx - Glottis, AJCC 8th Edition - Clinical: Stage I (cT1a, cN0, cM0) - Signed by Eppie Gibson, MD on 05/06/2018   CHIEF COMPLAINT: Here to discuss management of vocal cord cancer  HISTORY OF PRESENT ILLNESS:Allister D Jaroszewski, pronounced "Le-VAN", is a 72 y.o. male who presented with a two-month history of hoarseness.  Subsequently, the patient saw Dr. Blenda Nicely on 03/18/18 who noted bilateral true vocal cord lesions, right greater than left, on laryngoscopy.  Biopsy of the right true vocal cord on 04/18/18 revealed: Invasive squamous cell carcinoma. Biopsy of the left true vocal cord revealed: High grade dysplasia.  Pertinent imaging thus far includes CT of the neck performed on 04/29/18 revealing no sign of metastatic adenopathy or deep extension of the patient's known bilateral cord lesions.   There is some swelling in the region of the cords, probably subsequent to recent biopsies. I reviewed his images last week at tumor board and today.  Dr. Blenda Nicely told me that she believes the anterior commissure is involved by cancer. The patient has been referred today for discussion of potential radiation treatment options. We are joined in consultation by Gayleen Orem, RN, our Head and Neck Navigator.  Swallowing  issues, if any: He denies.  Weight Changes: He denies. Wt Readings from Last 3 Encounters:  05/06/18 167 lb 3.2 oz (75.8 kg)  04/18/18 169 lb 3.2 oz (76.7 kg)  04/15/18 169 lb 3.2 oz (76.7 kg)   Pain status: He reports back pain that is managed with hydrocodone.  Other symptoms: The patient had copious cerumen in the bilateral EACs. Dr. Blenda Nicely removed the cerumen bilaterally on 04/25/18. She will re-check him later this month to make sure that the right ear heals appropriately.  Tobacco history, if any: He quit smoking cold-turkey on 04/25/18.  ETOH abuse, if any: He does not drink alcohol.  Prior cancers, if any: He denies.  The patient resides alone here in [place] but spends a lot of time with his brothers. He has been retired for 6-7 years. He stays physically active with bowling and walking.  PREVIOUS RADIATION THERAPY: No  PAST MEDICAL HISTORY:  has a past medical history of Arthritis, GERD (gastroesophageal reflux disease), History of hiatal hernia, Hyperlipidemia, Irregular heart beat, and Scoliosis.    PAST SURGICAL HISTORY: Past Surgical History:  Procedure Laterality Date  . COLONOSCOPY  10-12-2004  . HERNIA REPAIR  1991   inguinal   . MICROLARYNGOSCOPY N/A 04/18/2018   Procedure: SUSPENSION MICRO-DIRECT LARYNGOSTOPY WITH BIOPSY OF VOCAL CORD LESIONS;  Surgeon: Helayne Seminole, MD;  Location: [facility];  Service: ENT;  Laterality: N/A;  . TONSILLECTOMY      FAMILY HISTORY: family history includes Congestive Heart Failure in his father; Lung cancer in his father.  SOCIAL HISTORY:  reports that he quit smoking 11 days ago. His smoking use included cigarettes. He smoked 0.25 packs per day. He has never used smokeless tobacco. He reports that he does not drink alcohol or use drugs.   ALLERGIES: Patient has no known allergies.  MEDICATIONS:  Current Outpatient Medications  Medication Sig Dispense Refill  . Ascorbic Acid (VITAMIN C) 1000 MG tablet Take 1,000 mg  by mouth daily.    Marland Kitchen aspirin EC 81 MG tablet Take 81 mg by mouth daily.    Marland Kitchen atorvastatin (LIPITOR) 10 MG tablet Take 10 mg by mouth daily.     . Cholecalciferol (VITAMIN D3) 1000 units CAPS Take 1,000 Units by mouth daily.     . Flaxseed, Linseed, (FLAXSEED OIL) 1200 MG CAPS Take 2,400 mg by mouth daily.     Marland Kitchen gabapentin (NEURONTIN) 100 MG capsule Take 100 mg by mouth 3 (three) times daily.    Marland Kitchen HYDROcodone-acetaminophen (NORCO) 10-325 MG per tablet Take 1 tablet by mouth every 6 (six) hours as needed for moderate pain.     Marland Kitchen ibuprofen (ADVIL,MOTRIN) 200 MG tablet Take 400 mg by mouth every 6 (six) hours as needed for headache or moderate pain.    . Lecithin 1200 MG CAPS Take 2,400 mg by mouth daily.     . Multiple Vitamin (MULTIVITAMIN) tablet Take 1 tablet by mouth daily.    . Multiple Vitamins-Minerals (HEALTHY EYES PO) Take 1 capsule by mouth daily.    Marland Kitchen OVER THE COUNTER MEDICATION Take 5,000 mg by mouth daily. CBD Capsule 5000 mg Supplement    . pantoprazole (PROTONIX) 40 MG tablet Take 40 mg by mouth daily.     . sildenafil (VIAGRA) 100 MG tablet Take 100 mg by mouth daily as needed for erectile dysfunction.    . traMADol (ULTRAM) 50 MG tablet Take 50 mg by mouth 2 (two) times daily.    . vitamin E 400 UNIT capsule Take 400 Units by mouth daily.      No current facility-administered medications for this encounter.     REVIEW OF SYSTEMS:  A 10+ POINT REVIEW OF SYSTEMS WAS OBTAINED including neurology, dermatology, psychiatry, cardiac, respiratory, lymph, extremities, GI, GU, Musculoskeletal, constitutional, breasts, reproductive, HEENT.  All pertinent positives are noted in the HPI.  All others are negative.   PHYSICAL EXAM:  height is 5\' 10"  (1.778 m) and weight is 167 lb 3.2 oz (75.8 kg). His temperature is 98.7 F (37.1 C). His blood pressure is 116/84 and his pulse is 75. His oxygen saturation is 100%.   General: Alert and oriented, in no acute distress. HEENT: Head is  normocephalic. Extraocular movements are intact. Dentures removed for oral exam. He is edentulous. Oral cavity and oropharynx are clear. Neck: No masses. Heart: Regular in rate and rhythm with no murmurs, rubs, or gallops. Chest: Clear to auscultation bilaterally, with just a little atelectasis at the bases. Abdomen: Soft, nontender, nondistended, with no rigidity or guarding. Extremities: No cyanosis or edema. Lymphatics: see Neck Exam Skin: No concerning lesions. Musculoskeletal: Ambulatory. No signs of muscle wasting. Neurologic: Cranial nerves II through XII are grossly intact. No obvious focalities. Speech is fluent. Coordination is intact. Psychiatric: Judgment and insight are intact. Affect is appropriate.  PROCEDURE NOTE: After obtaining consent and anesthetizing the nasal cavity with topical lidocaine and phenylephrine, the flexible endoscope was introduced and passed through the nasal cavity.  He has some white irregular nodules over the bilateral true cords, and the irregular tissue appears to involve the  anterior commissure. It appears that at least the anterior 2/3 of the right cord is involved, and at least the anterior 1/3 of the left cord is involved. Note some irregularity could be biopsy artifact. The cords are symmetrically mobile. No other lesions are appreciated in the larynx or the pharynx.   ECOG = 0  0 - Asymptomatic (Fully active, able to carry on all predisease activities without restriction)  1 - Symptomatic but completely ambulatory (Restricted in physically strenuous activity but ambulatory and able to carry out work of a light or sedentary nature. For example, light housework, office work)  2 - Symptomatic, <50% in bed during the day (Ambulatory and capable of all self care but unable to carry out any work activities. Up and about more than 50% of waking hours)  3 - Symptomatic, >50% in bed, but not bedbound (Capable of only limited self-care, confined to bed or  chair 50% or more of waking hours)  4 - Bedbound (Completely disabled. Cannot carry on any self-care. Totally confined to bed or chair)  5 - Death   Eustace Pen MM, Creech RH, Tormey DC, et al. [phone]). "Toxicity and response criteria of the [facility]". [place] Oncol. 5 (6): 649-55   LABORATORY DATA:  Lab Results  Component Value Date   WBC 6.9 04/15/2018   HGB 13.6 04/15/2018   HCT 42.0 04/15/2018   MCV 90.9 04/15/2018   PLT 256 04/15/2018   CMP     Component Value Date/Time   NA 135 04/18/2018 1034   K 5.0 04/18/2018 1034   CL 102 04/18/2018 1034   CO2 25 04/18/2018 1034   GLUCOSE 92 04/18/2018 1034   BUN 15 04/18/2018 1034   CREATININE 1.27 (H) 04/18/2018 1034   CALCIUM 9.4 04/18/2018 1034   GFRNONAA 54 (L) 04/18/2018 1034   GFRAA >60 04/18/2018 1034      No results found for: TSH   RADIOGRAPHY: Personally reviewed by me. Ct Soft Tissue Neck W Contrast  Result Date: 04/29/2018 CLINICAL DATA:  Squamous cell carcinoma of the vocal cords. Hoarseness. Laryngoscopy on 04/18/2018. EXAM: CT NECK WITH CONTRAST TECHNIQUE: Multidetector CT imaging of the neck was performed using the standard protocol following the bolus administration of intravenous contrast. CONTRAST:  34mL ISOVUE-300 IOPAMIDOL (ISOVUE-300) INJECTION 61% COMPARISON:  None. FINDINGS: Pharynx and larynx: No abnormality seen of the nasopharynx, oropharynx or hypopharynx. The glottis is closed, presumably due to voluntary closure, but possibly due to postprocedure swelling. Biopsy has recently been performed of bilateral true cord lesions. These cannot be confidently diagnosed based on today's CT. No evidence of deep extension of disease. No evidence of cartilage involvement or destruction. Salivary glands: Parotid and submandibular glands are normal. Thyroid: Normal.  Left lobe extends towards the thoracic inlet. Lymph nodes: No enlarged or low-density nodes seen on either side of the neck. Normal  bilateral nodes. Vascular: Normal Limited intracranial: Normal Visualized orbits: Normal Mastoids and visualized paranasal sinuses: No significant inflammatory finding. Skeleton: Advanced degenerative cervical spondylosis with kyphotic curvature. Upper chest: Emphysema and pulmonary scarring. No evidence of metastatic disease or primary carcinoma. Calcified granuloma in the left upper lobe. Other: None IMPRESSION: No sign of metastatic adenopathy or deep extension of the patient's known bilateral cord lesions. There is some swelling in the region of the cords, probably subsequent to recent biopsies. Not possible to specifically separate out the contribution of the tumor lesions to this appearance. Lastly, the glottis is probably held in voluntary closure or else  the patient would be in extreme respiratory distress. Electronically Signed   By: Nelson Chimes M.D.   On: 04/29/2018 13:54      IMPRESSION/PLAN: Early Stage Laryngeal Cancer; Right cord and anterior commissure.  Left cord with high grade dysplasia.  This is a delightful patient with head and neck cancer. My assessment is consistent with Dr. Trish Mage findings. I do recommend radiotherapy for this patient. We discussed that radiation therapy would be directed to the larynx and would take 5.5-6 weeks to complete.    We discussed the potential risks, benefits, and side effects of radiotherapy. We talked in detail about acute and late effects. We discussed that some of the most bothersome acute effects may be mucositis, dysgeusia, increased hoarseness, salivary changes, skin irritation, hair loss, dehydration, weight loss and fatigue. We talked about late effects which include but are not necessarily limited to dysphagia, hypothyroidism, laryngeal necrosis. No guarantees of treatment were given. A consent form was signed and placed in the patient's medical record. The patient is enthusiastic about proceeding with treatment. I look forward to  participating in the patient's care.    Simulation (treatment planning) will take place today with treatment to begin on Monday November 18th.  We also discussed that the treatment of head and neck cancer is a multidisciplinary process to maximize treatment outcomes and quality of life. For this reason the following referrals have been or will be made:  1. Nutritionist for nutrition support during and after treatment.  2. Speech language pathology for swallowing and/or speech therapy.  3. Social work for social support.   4. Physical therapy due to risk of deconditioning.  5. Baseline labs including TSH.  I asked the patient today about tobacco use. The patient has discontinued tobacco.  I gave the patient the Tobacco Use Quitline for support: 1-800-QUIT-NOW.  __________________________________________   Eppie Gibson, MD  This document serves as a record of services personally performed by Eppie Gibson, MD. It was created on her behalf by Rae Lips, a trained medical scribe. The creation of this record is based on the scribe's personal observations and the provider's statements to them. This document has been checked and approved by the attending provider.

## 2018-05-06 NOTE — Progress Notes (Signed)
  Radiation Oncology         [phone] ________________________________  Name: DERREN SUYDAM MRN: [id number]  Date: 05/06/2018  DOB: 1945/09/10  SIMULATION AND TREATMENT PLANNING NOTE  Outpatient    ICD-10-CM   1. Malignant neoplasm of glottis ([place]) C32.0     NARRATIVE:  The patient was brought to the [facility].  Identity was confirmed.  All relevant records and images related to the planned course of therapy were reviewed.  The patient freely provided informed written consent to proceed with treatment after reviewing the details related to the planned course of therapy. The consent form was witnessed and verified by the simulation staff.    Then, the patient was set-up in a stable reproducible supine position for radiation therapy.  Aquaplast head and should mask was custom fabricated for immobilization.  CT images were obtained without contrast.  Surface markings were placed.  The CT images were loaded into the planning software.    TREATMENT PLANNING NOTE: Treatment planning then occurred.  The radiation prescription was entered and confirmed.    A total of 3 medically necessary complex treatment devices were fabricated and supervised by me (2 wedges for the opposed fields and the Aquaplast head and shoulder mask). I have requested : 3D Simulation  I have requested a DVH of the following structures: target volume, esophagus, spinal cord.  I have ordered:Nutrition Consult  The patient will receive 63 Gy in 28 fractions to the larynx.  -----------------------------------  Eppie Gibson, MD

## 2018-05-07 NOTE — Progress Notes (Signed)
Oncology Nurse Navigator Documentation  Met with Mr. Rando during initial consult with Dr. Isidore Moos.  He was unaccompanied.    . Further introduced myself as his Navigator, explained my role as a member of the Care Team.   . Provided New Patient Information packet, discussed contents: o Contact information for physician(s), myself, other members of the Care Team. o Advance Directive information ([place] blue pamphlet with LCSW contact info).  He stated he is meeting with lawyer this week to complete ADs, will bring copy. o Fall Prevention Patient Safety Plan o Appointment Guideline o [facility] map with highlight of [place] o SLP information sheet o Symptom Management Clinic information . Provided introductory explanation of radiation treatment including SIM planning and purpose of Aquaplast head and shoulder mask, showed him example.   Marland Kitchen He voiced understanding of plan for 6 weeks M-F XRT. Escorted him to CT SIM.    He completed procedure without incident.  Toured him to [facility] treatment area, explained registration/arrival procedures.  Escorted him to lobby, showed/explained RadOnc kiosk for XRT appt registration. I encouraged him to contact me with questions/concerns as treatments/procedures begin.  He verbalized understanding.    Gayleen Orem, RN, BSN Head & Neck Oncology Nurse [place] at [place] [phone]

## 2018-05-09 DIAGNOSIS — Z1329 Encounter for screening for other suspected endocrine disorder: Secondary | ICD-10-CM | POA: Diagnosis not present

## 2018-05-12 ENCOUNTER — Encounter: Payer: Self-pay | Admitting: *Deleted

## 2018-05-12 ENCOUNTER — Ambulatory Visit
Admission: RE | Admit: 2018-05-12 | Discharge: 2018-05-12 | Disposition: A | Discharge status: Home / Self Care | Payer: Medicare HMO | Source: Ambulatory Visit | Attending: Radiation Oncology | Admitting: Radiation Oncology

## 2018-05-12 DIAGNOSIS — C320 Malignant neoplasm of glottis: Secondary | ICD-10-CM

## 2018-05-12 DIAGNOSIS — Z1329 Encounter for screening for other suspected endocrine disorder: Secondary | ICD-10-CM | POA: Diagnosis not present

## 2018-05-12 MED ORDER — SONAFINE EX EMUL
1.0000 "application " | Freq: Once | CUTANEOUS | Status: AC
  Administered 2018-05-12: 1 via TOPICAL

## 2018-05-12 NOTE — Progress Notes (Signed)

## 2018-05-13 ENCOUNTER — Ambulatory Visit
Admission: RE | Admit: 2018-05-13 | Discharge: 2018-05-13 | Disposition: A | Discharge status: Home / Self Care | Payer: Medicare HMO | Source: Ambulatory Visit | Attending: Radiation Oncology | Admitting: Radiation Oncology

## 2018-05-13 DIAGNOSIS — Z1329 Encounter for screening for other suspected endocrine disorder: Secondary | ICD-10-CM | POA: Diagnosis not present

## 2018-05-13 NOTE — Progress Notes (Signed)
Oncology Nurse Navigator Documentation  To provide support, encouragement and care continuity, met with Aaron Klein for his initial RT.  He was unaccompanied.  He completed treatment without difficulty, denied questions/concerns.  I reviewed the registration/arrival procedure for subsequent treatments.  I escorted him to weekly PUT with Dr. Isidore Moos.  I encouraged him to call me with questions/concerns as tmts proceed.  Gayleen Orem, RN, BSN Head & Neck Oncology Nurse [place] at [place] [phone]

## 2018-05-14 ENCOUNTER — Ambulatory Visit
Admission: RE | Admit: 2018-05-14 | Discharge: 2018-05-14 | Disposition: A | Discharge status: Home / Self Care | Payer: Medicare HMO | Source: Ambulatory Visit | Attending: Radiation Oncology | Admitting: Radiation Oncology

## 2018-05-14 DIAGNOSIS — Z1329 Encounter for screening for other suspected endocrine disorder: Secondary | ICD-10-CM | POA: Diagnosis not present

## 2018-05-15 ENCOUNTER — Other Ambulatory Visit: Payer: Self-pay

## 2018-05-15 ENCOUNTER — Ambulatory Visit
Admission: RE | Admit: 2018-05-15 | Discharge: 2018-05-15 | Disposition: A | Discharge status: Home / Self Care | Payer: Medicare HMO | Source: Ambulatory Visit | Attending: Radiation Oncology | Admitting: Radiation Oncology

## 2018-05-15 ENCOUNTER — Ambulatory Visit: Payer: Medicare HMO | Attending: Radiation Oncology

## 2018-05-15 DIAGNOSIS — C320 Malignant neoplasm of glottis: Secondary | ICD-10-CM | POA: Diagnosis present

## 2018-05-15 DIAGNOSIS — R1310 Dysphagia, unspecified: Secondary | ICD-10-CM | POA: Diagnosis not present

## 2018-05-15 DIAGNOSIS — Z1329 Encounter for screening for other suspected endocrine disorder: Secondary | ICD-10-CM | POA: Diagnosis not present

## 2018-05-15 NOTE — Therapy (Signed)
[facility] [address] [place], [place], [address] Phone: [phone]   Fax:  [phone]  Speech Language Pathology Evaluation  Patient Details  Name: Aaron Klein MRN: [id number] Date of Birth: [birth year] Referring Provider (SLP): Eppie Gibson, MD   Encounter Date: 05/15/2018  End of Session - 05/15/18 1728    Visit Number  1    Number of Visits  4    Date for SLP Re-Evaluation  08/13/18    SLP Start Time  0935    SLP Stop Time   1015    SLP Time Calculation (min)  40 min    Activity Tolerance  Patient tolerated treatment well       Past Medical History:  Diagnosis Date  . Arthritis   . GERD (gastroesophageal reflux disease)   . History of hiatal hernia   . Hyperlipidemia   . Irregular heart beat   . Scoliosis     Past Surgical History:  Procedure Laterality Date  . COLONOSCOPY  10-12-2004  . HERNIA REPAIR  1991   inguinal   . MICROLARYNGOSCOPY N/A 04/18/2018   Procedure: SUSPENSION MICRO-DIRECT LARYNGOSTOPY WITH BIOPSY OF VOCAL CORD LESIONS;  Surgeon: Helayne Seminole, MD;  Location: [facility];  Service: ENT;  Laterality: N/A;  . TONSILLECTOMY      There were no vitals filed for this visit.  Subjective Assessment - 05/15/18 0918    Subjective  Pt began rad tx during this past week.          SLP Evaluation [facility] - 05/15/18 0174      SLP Visit Information   SLP Received On  05/15/18    Referring Provider (SLP)  Eppie Gibson, MD    Onset Date  approx 2-3 months ago    Medical Diagnosis  rt true vocal cord SCCa      Subjective   Patient/Family Stated Goal  Maintain WNL/WFL swallowing      General Information   HPI  Pt with 2-3 months hoarseness. Dr. 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 Dr. Isidore Moos began rad tx with the patient Monday 05-12-18, with 6.5 - 7 weeks planned.       Oral Motor/Sensory  Function   Overall Oral Motor/Sensory Function  Appears within functional limits for tasks assessed      Motor Speech   Phonation  Hoarse   since biopsy      Pt currently tolerates regular diet and thin liquids. Cough appeared WNL, and throat clear was slightly weak, suspect this may be due to vocal fold margin irregularity. Although vocal adduction for HEP was with what appeared to be WNL closure. POs: Pt ate cereal bar and drank H2O  without overt s/s aspiration. Thyroid elevation appeared adequate, and swallows appeared timely. Oral residue noted as minimal. Pt's swallow deemed WNL at this time.   Because data states the risk for dysphagia during and after radiation treatment is high due to undergoing radiation tx, SLP taught pt about the possibility of reduced/limited ability for PO intake during rad tx. SLP encouraged pt to continue swallowing POs as far into rad tx as possible, even ingesting POs and/or completing HEP shortly after administration of pain meds.   SLP educated pt re: changes to swallowing musculature after rad tx, and why adherence to dysphagia HEP provided today and PO consumption was necessary to reduce muscle fibrosis following rad tx. Pt demonstrated understanding  of these things to SLP.    SLP then developed a HEP for pt and pt was instructed how to perform exercises involving lingual, vocal, and pharyngeal strengthening. SLP performed each exercise and pt return demonstrated each exercise. SLP ensured pt performance was correct prior to moving on to next exercise. Pt was instructed to complete this program 2 times a day, 6-7 days/week until 6 months after his or her last rad tx, then x2 a week after that.                 SLP Education - 05/15/18 1726    Education Details  late effects head/neck radiation, HEP procedure    Person(s) Educated  Patient    Methods  Explanation;Demonstration;Verbal cues;Handout    Comprehension  Verbalized  understanding;Verbal cues required;Need further instruction;Returned demonstration       SLP Short Term Goals - 05/15/18 1737      SLP SHORT TERM GOAL #1   Title  pt will complete HEP with rare min A over two sessions    Time  2    Period  --   visits   Status  New      SLP SHORT TERM GOAL #2   Title  pt will tell SLP why he is completing HEP     Time  2    Period  --   visits   Status  New      SLP SHORT TERM GOAL #3   Title  pt will tell SLP how a food journal can foster a quicker return to a more-normalized diet    Time  2    Period  --   visits   Status  New       SLP Long Term Goals - 05/15/18 1738      SLP LONG TERM GOAL #1   Title  pt will tell SLP 3 overt s/s of aspiration PNA with modified independence    Time  3    Period  --   visits   Status  New      SLP LONG TERM GOAL #2   Title  pt will complete HEP with modified independence over two sessions    Time  3    Period  --   visits   Status  New      SLP LONG TERM GOAL #3   Title  pt will tell when he can begin to complete HEP x2/week    Time  3    Period  --   visits   Status  New       Plan - 05/15/18 1736    Clinical Impression Statement  Pt with oropharyngeal swallowing essentially WNL, however the probability of swallowing difficulty increases dramatically with the initiation of radiation therapy. Pt will need to be followed by SLP for regular assessment of accurate HEP completion as well as for safety with POs both during and following treatment/s.    Speech Therapy Frequency  --   approx once every 4 weeks   Duration  --   for 90 days (2-3 visits)   Treatment/Interventions  Aspiration precaution training;Pharyngeal strengthening exercises;Diet toleration management by SLP;Compensatory techniques;Internal/external aids;SLP instruction and feedback;Cueing hierarchy;Trials of upgraded texture/liquids    Potential to Achieve Goals  Good    SLP Home Exercise Plan  provided today    Consulted  and Agree with Plan of Care  Patient       Patient will benefit from skilled therapeutic  intervention in order to improve the following deficits and impairments:   Dysphagia, unspecified type - Plan: SLP plan of care cert/re-cert    Problem List Patient Active Problem List   Diagnosis Date Noted  . 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

## 2018-05-15 NOTE — Patient Instructions (Addendum)
SWALLOWING EXERCISES Do these 6 of the 7 days per week until 6 months after your last day  of radiation, then 2 times per week afterwards  1. Effortful Swallows - Press your tongue against the roof of your mouth for 3 seconds, then squeeze  the muscles in your neck while you swallow your saliva or a sip of water - Repeat 15 times, 2 times a day, and use whenever you eat or drink  2. Masako Swallow - swallow with your tongue sticking out - Stick tongue out past your teeth and gently bite tongue with your teeth - Swallow, while holding your tongue with your teeth - Repeat  times, 2 times a day *use a wet spoon if your mouth gets dry*  3. Pitch Raise - Repeat "he", once per second in as high of a pitch as you can - Repeat 20 times, 2-3 times a day  4. Mendelsohn Maneuver - "half swallow" exercise - Start to swallow, and keep your Adam's apple up by squeezing hard with  the muscles of the throat - Hold the squeeze for 5-7 seconds and then relax - Repeat 15 times, 2 times a day *use a wet spoon if your mouth gets dry*  5. Breath Hold - Say "HUH!" loudly, then hold your breath for 3 seconds at your voice box - Repeat 20 times, 2 times a day  6. Chin pushback - Open your mouth  - Place your fist UNDER your chin near your neck, and push back with  your fist for 5 seconds - Repeat 10 times, 2 times a day    7. "Siren" exercise  - say "ooooo" or "ahhhh" at a low pitch, raise pitch as high as you can, then    lower it as low as you can  - repeat 15-20 times, 2 times a day

## 2018-05-16 ENCOUNTER — Telehealth: Payer: Self-pay | Admitting: *Deleted

## 2018-05-16 ENCOUNTER — Ambulatory Visit
Admission: RE | Admit: 2018-05-16 | Discharge: 2018-05-16 | Disposition: A | Discharge status: Home / Self Care | Payer: Medicare HMO | Source: Ambulatory Visit | Attending: Radiation Oncology | Admitting: Radiation Oncology

## 2018-05-16 DIAGNOSIS — C329 Malignant neoplasm of larynx, unspecified: Secondary | ICD-10-CM | POA: Insufficient documentation

## 2018-05-16 DIAGNOSIS — Z1329 Encounter for screening for other suspected endocrine disorder: Secondary | ICD-10-CM | POA: Diagnosis not present

## 2018-05-16 NOTE — Progress Notes (Signed)
A user error has taken place: encounter opened in error, closed for administrative reasons.

## 2018-05-16 NOTE — Telephone Encounter (Signed)
Oncology Nurse Navigator Documentation  Called pt, confirmed his attendance at next Tuesday's H&N [place], provided him an 0800 arrival to Radiation Waiting after lobby registration.  He voiced understanding.  Gayleen Orem, RN, BSN Head & Neck Oncology Nurse [place] at [place] [phone]

## 2018-05-18 ENCOUNTER — Ambulatory Visit
Admission: RE | Admit: 2018-05-18 | Discharge: 2018-05-18 | Disposition: A | Discharge status: Home / Self Care | Payer: Medicare HMO | Source: Ambulatory Visit | Attending: Radiation Oncology | Admitting: Radiation Oncology

## 2018-05-18 DIAGNOSIS — Z1329 Encounter for screening for other suspected endocrine disorder: Secondary | ICD-10-CM | POA: Diagnosis not present

## 2018-05-19 ENCOUNTER — Other Ambulatory Visit: Payer: Self-pay | Admitting: Radiation Oncology

## 2018-05-19 ENCOUNTER — Telehealth: Payer: Self-pay | Admitting: *Deleted

## 2018-05-19 ENCOUNTER — Ambulatory Visit
Admission: RE | Admit: 2018-05-19 | Discharge: 2018-05-19 | Disposition: A | Discharge status: Home / Self Care | Payer: Medicare HMO | Source: Ambulatory Visit | Attending: Radiation Oncology | Admitting: Radiation Oncology

## 2018-05-19 DIAGNOSIS — Z1329 Encounter for screening for other suspected endocrine disorder: Secondary | ICD-10-CM | POA: Diagnosis not present

## 2018-05-19 DIAGNOSIS — C320 Malignant neoplasm of glottis: Secondary | ICD-10-CM

## 2018-05-19 MED ORDER — LIDOCAINE VISCOUS HCL 2 % MT SOLN: OROMUCOSAL | 5 refills | Status: DC

## 2018-05-19 NOTE — Telephone Encounter (Signed)
Oncology Nurse Navigator Documentation  LVMM for Mr. Kabir reminding him of 0800 arrival tomorrow morning and registration procedure for H&N [facility].  Gayleen Orem, RN, BSN Head & Neck Oncology Nurse [place] at [place] [phone]

## 2018-05-20 ENCOUNTER — Ambulatory Visit
Admission: RE | Admit: 2018-05-20 | Discharge: 2018-05-20 | Disposition: A | Discharge status: Home / Self Care | Payer: Medicare HMO | Source: Ambulatory Visit | Attending: Radiation Oncology | Admitting: Radiation Oncology

## 2018-05-20 ENCOUNTER — Encounter: Payer: Medicare HMO | Admitting: Nutrition

## 2018-05-20 ENCOUNTER — Ambulatory Visit: Payer: Medicare HMO | Admitting: Physical Therapy

## 2018-05-20 ENCOUNTER — Encounter: Payer: Self-pay | Admitting: *Deleted

## 2018-05-20 ENCOUNTER — Inpatient Hospital Stay: Payer: Medicare HMO | Attending: Radiation Oncology | Admitting: Nutrition

## 2018-05-20 DIAGNOSIS — Z1329 Encounter for screening for other suspected endocrine disorder: Secondary | ICD-10-CM | POA: Diagnosis not present

## 2018-05-20 DIAGNOSIS — C320 Malignant neoplasm of glottis: Secondary | ICD-10-CM

## 2018-05-20 NOTE — Therapy (Signed)
[facility] [place], [place], [address] Phone: [phone]   Fax:  [phone]  Patient Details  Name: Aaron Klein MRN: [id number] Date of Birth: Jul 24, 1945 Referring Provider:  Eppie Gibson, MD  Encounter Date: 05/20/2018  Explained the purpose of physical therapy today to patient and what to expect during the evaluation. Pt stated that he was not interested in being evaluated. Explained to pt that he would benefit from education on lymphedema but pt still expressed that he was not interested. Gave pt a brochure regarding how PT can help pt.   Allyson Sabal [facility] 05/20/2018, [place], [place], [address] Phone: [phone]   Fax:  [phone]

## 2018-05-20 NOTE — Progress Notes (Signed)
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  Patient to receive 28 fxt to larynx, started on 05/12/18.  Past medical history of arthritis, GERd, hiatal hernia, quit smoking on 04/25/18.    Met with patient in clinic this am. Noted patient has been seen by SLP on 05/15/18.  Patient reports that he typically just eats 1 meal per day and has "all my life."  Reports that he bought some ensure recently and has been trying to drink one daily.  Reports that when he gets hungry he will eat.  Sometimes goes out for breakfast and then skips lunch.  Patient lives alone and reports does all of cooking.    Patient reports no trouble chewing or swallowing foods.     Medications: Vit C, lipitor, Vit D3, flaxseed oil, MVI, protonix, Vit E   Labs: reviewed   Anthropometrics:   Height: 70 inches Weight: 170 lb today in clinic UBW: 168-173 lb over the last 2-3 years per patient Reports 12-13 years ago weighed 187 lb and went through divorce and lost weight and has never been able to regain it. BMI: 23   Estimated Energy Needs  Kcals: 1925-2300 calories Protein: 96-115 g/d Fluid: 2.3 L   NUTRITION DIAGNOSIS: predicted suboptimal intake related to radiation treatment as evidenced by side effects from treatment likely to effect oral intake   INTERVENTION:  Discussed importance of good nutrition during treatment.  Encouraged patient to eat more than 1 meal per day.  We talked about strategies to help him meet this goal.   Provided patient with soft, moist protein foods handout today.   Discussed oral nutrition supplements and encouraged high calorie options.  Provided coupons today. Contact information was provided   MONITORING, EVALUATION, GOAL: Patient will consume adequate nutrition to maintain weight during treatments   Next Visit: to be determined   B. , RD,  LDN Registered Dietitian [phone] (pager)        

## 2018-05-21 ENCOUNTER — Ambulatory Visit
Admission: RE | Admit: 2018-05-21 | Discharge: 2018-05-21 | Disposition: A | Discharge status: Home / Self Care | Payer: Medicare HMO | Source: Ambulatory Visit | Attending: Radiation Oncology | Admitting: Radiation Oncology

## 2018-05-21 DIAGNOSIS — Z1329 Encounter for screening for other suspected endocrine disorder: Secondary | ICD-10-CM | POA: Diagnosis not present

## 2018-05-24 NOTE — Progress Notes (Signed)
Oncology Nurse Navigator Documentation  Met with Mr. Bedel upon his arrival for H&N [facility].  He was unaccompanied.  Provided verbal and written overview of [facility], the clinicians who will be seeing him, encouraged him to ask questions during his time with them.  He was seen by Nutrition, PT, and RadOnc Investment banker, operational.  He had met with SLP Garald Balding 05/15/18.  He dismissed PT Blaire upon her arrival to exam room.  I met with him, he commented "It's a waste of time, everybody just wants a piece of the pie. My fiance saw PT after her surgery, didn't do her any good."  I explained the value of PT for maximizing his tmt outcome.  He repeated refusal.    Spoke with him at end of [facility], addressed questions, direct him to register in lobby for early RT, later escorted him to [facility]. I encouraged him to call me with needs/concerns.  Gayleen Orem, RN, BSN Head & Neck Oncology [place] at [place] [phone]

## 2018-05-26 ENCOUNTER — Ambulatory Visit
Admission: RE | Admit: 2018-05-26 | Discharge: 2018-05-26 | Disposition: A | Discharge status: Home / Self Care | Payer: Medicare HMO | Source: Ambulatory Visit | Attending: Radiation Oncology | Admitting: Radiation Oncology

## 2018-05-26 DIAGNOSIS — Z1329 Encounter for screening for other suspected endocrine disorder: Secondary | ICD-10-CM | POA: Insufficient documentation

## 2018-05-26 DIAGNOSIS — C320 Malignant neoplasm of glottis: Secondary | ICD-10-CM | POA: Insufficient documentation

## 2018-05-27 ENCOUNTER — Ambulatory Visit
Admission: RE | Admit: 2018-05-27 | Discharge: 2018-05-27 | Disposition: A | Discharge status: Home / Self Care | Payer: Medicare HMO | Source: Ambulatory Visit | Attending: Radiation Oncology | Admitting: Radiation Oncology

## 2018-05-27 DIAGNOSIS — Z1329 Encounter for screening for other suspected endocrine disorder: Secondary | ICD-10-CM | POA: Diagnosis not present

## 2018-05-28 ENCOUNTER — Ambulatory Visit
Admission: RE | Admit: 2018-05-28 | Discharge: 2018-05-28 | Disposition: A | Discharge status: Home / Self Care | Payer: Medicare HMO | Source: Ambulatory Visit | Attending: Radiation Oncology | Admitting: Radiation Oncology

## 2018-05-28 DIAGNOSIS — Z1329 Encounter for screening for other suspected endocrine disorder: Secondary | ICD-10-CM | POA: Diagnosis not present

## 2018-05-29 ENCOUNTER — Ambulatory Visit
Admission: RE | Admit: 2018-05-29 | Discharge: 2018-05-29 | Disposition: A | Discharge status: Home / Self Care | Payer: Medicare HMO | Source: Ambulatory Visit | Attending: Radiation Oncology | Admitting: Radiation Oncology

## 2018-05-29 DIAGNOSIS — Z1329 Encounter for screening for other suspected endocrine disorder: Secondary | ICD-10-CM | POA: Diagnosis not present

## 2018-05-30 ENCOUNTER — Ambulatory Visit
Admission: RE | Admit: 2018-05-30 | Discharge: 2018-05-30 | Disposition: A | Discharge status: Home / Self Care | Payer: Medicare HMO | Source: Ambulatory Visit | Attending: Radiation Oncology | Admitting: Radiation Oncology

## 2018-05-30 DIAGNOSIS — Z1329 Encounter for screening for other suspected endocrine disorder: Secondary | ICD-10-CM | POA: Diagnosis not present

## 2018-06-02 ENCOUNTER — Ambulatory Visit
Admission: RE | Admit: 2018-06-02 | Discharge: 2018-06-02 | Disposition: A | Discharge status: Home / Self Care | Payer: Medicare HMO | Source: Ambulatory Visit | Attending: Radiation Oncology | Admitting: Radiation Oncology

## 2018-06-02 DIAGNOSIS — Z1329 Encounter for screening for other suspected endocrine disorder: Secondary | ICD-10-CM | POA: Diagnosis not present

## 2018-06-03 ENCOUNTER — Ambulatory Visit
Admission: RE | Admit: 2018-06-03 | Discharge: 2018-06-03 | Disposition: A | Discharge status: Home / Self Care | Payer: Medicare HMO | Source: Ambulatory Visit | Attending: Radiation Oncology | Admitting: Radiation Oncology

## 2018-06-03 DIAGNOSIS — Z1329 Encounter for screening for other suspected endocrine disorder: Secondary | ICD-10-CM | POA: Diagnosis not present

## 2018-06-04 ENCOUNTER — Ambulatory Visit
Admission: RE | Admit: 2018-06-04 | Discharge: 2018-06-04 | Disposition: A | Discharge status: Home / Self Care | Payer: Medicare HMO | Source: Ambulatory Visit | Attending: Radiation Oncology | Admitting: Radiation Oncology

## 2018-06-04 DIAGNOSIS — Z1329 Encounter for screening for other suspected endocrine disorder: Secondary | ICD-10-CM | POA: Diagnosis not present

## 2018-06-05 ENCOUNTER — Ambulatory Visit
Admission: RE | Admit: 2018-06-05 | Discharge: 2018-06-05 | Disposition: A | Discharge status: Home / Self Care | Payer: Medicare HMO | Source: Ambulatory Visit | Attending: Radiation Oncology | Admitting: Radiation Oncology

## 2018-06-05 DIAGNOSIS — Z1329 Encounter for screening for other suspected endocrine disorder: Secondary | ICD-10-CM | POA: Diagnosis not present

## 2018-06-06 ENCOUNTER — Ambulatory Visit
Admission: RE | Admit: 2018-06-06 | Discharge: 2018-06-06 | Disposition: A | Discharge status: Home / Self Care | Payer: Medicare HMO | Source: Ambulatory Visit | Attending: Radiation Oncology | Admitting: Radiation Oncology

## 2018-06-06 DIAGNOSIS — Z1329 Encounter for screening for other suspected endocrine disorder: Secondary | ICD-10-CM | POA: Diagnosis not present

## 2018-06-09 ENCOUNTER — Ambulatory Visit: Payer: Medicare HMO | Attending: Radiation Oncology

## 2018-06-09 ENCOUNTER — Ambulatory Visit
Admission: RE | Admit: 2018-06-09 | Discharge: 2018-06-09 | Disposition: A | Discharge status: Home / Self Care | Payer: Medicare HMO | Source: Ambulatory Visit | Attending: Radiation Oncology | Admitting: Radiation Oncology

## 2018-06-09 DIAGNOSIS — R1310 Dysphagia, unspecified: Secondary | ICD-10-CM | POA: Diagnosis not present

## 2018-06-09 DIAGNOSIS — Z1329 Encounter for screening for other suspected endocrine disorder: Secondary | ICD-10-CM | POA: Diagnosis not present

## 2018-06-09 NOTE — Therapy (Signed)
[facility] [address] [place], [place], [address] Phone: [phone]   Fax:  [phone]  Speech Language Pathology Treatment  Patient Details  Name: Aaron Klein MRN: [id number] Date of Birth: March 01, 1946 Referring Provider (SLP): Eppie Gibson, MD   Encounter Date: 06/09/2018  End of Session - 06/09/18 1404    Visit Number  2    Number of Visits  4    Date for SLP Re-Evaluation  08/13/18    SLP Start Time  1320    SLP Stop Time   1352    SLP Time Calculation (min)  32 min    Activity Tolerance  Patient tolerated treatment well       Past Medical History:  Diagnosis Date  . Arthritis   . GERD (gastroesophageal reflux disease)   . History of hiatal hernia   . Hyperlipidemia   . Irregular heart beat   . Scoliosis     Past Surgical History:  Procedure Laterality Date  . COLONOSCOPY  10-12-2004  . HERNIA REPAIR  1991   inguinal   . MICROLARYNGOSCOPY N/A 04/18/2018   Procedure: SUSPENSION MICRO-DIRECT LARYNGOSTOPY WITH BIOPSY OF VOCAL CORD LESIONS;  Surgeon: Helayne Seminole, MD;  Location: [facility];  Service: ENT;  Laterality: N/A;  . TONSILLECTOMY      There were no vitals filed for this visit.  Subjective Assessment - 06/09/18 1327    Subjective  In last 48 hours pt has eaten barbeque ribs and beans, and steak and baked potato.    Currently in Pain?  No/denies            ADULT SLP TREATMENT - 06/09/18 1328      General Information   Behavior/Cognition  Alert;Cooperative;Pleasant mood      Treatment Provided   Treatment provided  Dysphagia      Dysphagia Treatment   Temperature Spikes Noted  No    Respiratory Status  Room air    Oral Cavity - Dentition  Dentures, bottom;Dentures, top    Patient observed directly with PO's  Yes    Type of PO's observed  Regular;Thin liquids    Liquids provided via  Cup    Oral Phase Signs & Symptoms  --   none   Pharyngeal Phase Signs & Symptoms   --   none noted   Type of cueing  Verbal    Amount of cueing  Modified independent    Other treatment/comments  8 treamtments left after today. Pt with mild harsh voice. He states his vocal quality has improved over his course of rad tx. Pt with modified independence today with HEP, and no overt s/s aspiration iwht POs today in session. Pt reports completing HEP between 1 and 2 times daily since last ST session. Pt told SLP correctly why he is completing exercises, and also voiced back why a food journal could be helpful for someone returning back to regular diet after modifications to diet were necessary.      Dysphagia Recommendations   Diet recommendations  --   as tolerated   Medication Administration  --   as tolerated - whole wiht liquid is best     Progression Toward Goals   Progression toward goals  Progressing toward goals       SLP Education - 06/09/18 1403    Education Details  benefits of a food journal    Person(s) Educated  Patient    Methods  Explanation    Comprehension  Verbalized understanding       SLP Short Term Goals - 06/09/18 1342      SLP SHORT TERM GOAL #1   Title  pt will complete HEP with rare min A over two sessions    Time  2    Period  --   visits   Status  New      SLP SHORT TERM GOAL #2   Title  pt will tell SLP why he is completing HEP     Status  Achieved      SLP SHORT TERM GOAL #3   Title  pt will tell SLP how a food journal can foster a quicker return to a more-normalized diet    Status  Achieved       SLP Long Term Goals - 06/09/18 [place] #1   Title  pt will tell SLP 3 overt s/s of aspiration PNA with modified independence    Time  2    Period  --   visits   Status  On-going      SLP LONG TERM GOAL #2   Title  pt will complete HEP with modified independence over two sessions    Time  2    Period  --   visits   Status  On-going      SLP LONG TERM GOAL #3   Title  pt will tell when he can begin to  complete HEP x2/week    Time  2    Period  --   visits   Status  On-going       Plan - 06/09/18 1404    Clinical Impression Statement  Pt with oropharyngeal swallowing essentially WNL, however the probability of swallowing difficulty still exists with the initiation of radiation therapy. Pt doing excellent job with procedure of HEP. Due to progress, he will be seen once every approx 8 weeks. Pt will need to be followed by SLP for regular assessment of accurate HEP completion as well as for safety with POs both during and following treatment/s.    Speech Therapy Frequency  --   approx once every 8 weeks   Duration  --   for 90 days (2-3 visits)   Treatment/Interventions  Aspiration precaution training;Pharyngeal strengthening exercises;Diet toleration management by SLP;Compensatory techniques;Internal/external aids;SLP instruction and feedback;Cueing hierarchy;Trials of upgraded texture/liquids    Potential to Achieve Goals  Good    SLP Home Exercise Plan  provided today    Consulted and Agree with Plan of Care  Patient       Patient will benefit from skilled therapeutic intervention in order to improve the following deficits and impairments:   Dysphagia, unspecified type    Problem List Patient Active Problem List   Diagnosis Date Noted  . 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

## 2018-06-10 ENCOUNTER — Ambulatory Visit
Admission: RE | Admit: 2018-06-10 | Discharge: 2018-06-10 | Disposition: A | Discharge status: Home / Self Care | Payer: Medicare HMO | Source: Ambulatory Visit | Attending: Radiation Oncology | Admitting: Radiation Oncology

## 2018-06-10 DIAGNOSIS — Z1329 Encounter for screening for other suspected endocrine disorder: Secondary | ICD-10-CM | POA: Diagnosis not present

## 2018-06-11 ENCOUNTER — Ambulatory Visit
Admission: RE | Admit: 2018-06-11 | Discharge: 2018-06-11 | Disposition: A | Discharge status: Home / Self Care | Payer: Medicare HMO | Source: Ambulatory Visit | Attending: Radiation Oncology | Admitting: Radiation Oncology

## 2018-06-11 DIAGNOSIS — Z1329 Encounter for screening for other suspected endocrine disorder: Secondary | ICD-10-CM | POA: Diagnosis not present

## 2018-06-12 ENCOUNTER — Ambulatory Visit
Admission: RE | Admit: 2018-06-12 | Discharge: 2018-06-12 | Disposition: A | Discharge status: Home / Self Care | Payer: Medicare HMO | Source: Ambulatory Visit | Attending: Radiation Oncology | Admitting: Radiation Oncology

## 2018-06-12 DIAGNOSIS — Z1329 Encounter for screening for other suspected endocrine disorder: Secondary | ICD-10-CM | POA: Diagnosis not present

## 2018-06-13 ENCOUNTER — Ambulatory Visit
Admission: RE | Admit: 2018-06-13 | Discharge: 2018-06-13 | Disposition: A | Discharge status: Home / Self Care | Payer: Medicare HMO | Source: Ambulatory Visit | Attending: Radiation Oncology | Admitting: Radiation Oncology

## 2018-06-13 DIAGNOSIS — Z1329 Encounter for screening for other suspected endocrine disorder: Secondary | ICD-10-CM | POA: Diagnosis not present

## 2018-06-16 ENCOUNTER — Ambulatory Visit
Admission: RE | Admit: 2018-06-16 | Discharge: 2018-06-16 | Disposition: A | Discharge status: Home / Self Care | Payer: Medicare HMO | Source: Ambulatory Visit | Attending: Radiation Oncology | Admitting: Radiation Oncology

## 2018-06-16 DIAGNOSIS — Z1329 Encounter for screening for other suspected endocrine disorder: Secondary | ICD-10-CM | POA: Diagnosis not present

## 2018-06-17 ENCOUNTER — Ambulatory Visit
Admission: RE | Admit: 2018-06-17 | Discharge: 2018-06-17 | Disposition: A | Discharge status: Home / Self Care | Payer: Medicare HMO | Source: Ambulatory Visit | Attending: Radiation Oncology | Admitting: Radiation Oncology

## 2018-06-17 DIAGNOSIS — Z1329 Encounter for screening for other suspected endocrine disorder: Secondary | ICD-10-CM | POA: Diagnosis not present

## 2018-06-19 ENCOUNTER — Ambulatory Visit
Admission: RE | Admit: 2018-06-19 | Discharge: 2018-06-19 | Disposition: A | Discharge status: Home / Self Care | Payer: Medicare HMO | Source: Ambulatory Visit | Attending: Radiation Oncology | Admitting: Radiation Oncology

## 2018-06-19 DIAGNOSIS — Z1329 Encounter for screening for other suspected endocrine disorder: Secondary | ICD-10-CM | POA: Diagnosis not present

## 2018-06-20 ENCOUNTER — Ambulatory Visit
Admission: RE | Admit: 2018-06-20 | Discharge: 2018-06-20 | Disposition: A | Discharge status: Home / Self Care | Payer: Medicare HMO | Source: Ambulatory Visit | Attending: Radiation Oncology | Admitting: Radiation Oncology

## 2018-06-20 DIAGNOSIS — Z1329 Encounter for screening for other suspected endocrine disorder: Secondary | ICD-10-CM | POA: Diagnosis not present

## 2018-06-23 ENCOUNTER — Ambulatory Visit: Payer: Medicare HMO

## 2018-06-23 NOTE — Progress Notes (Signed)
Aaron Klein presents for follow up of radiation completed 06/20/18 to his head and neck.   Pain issues, if any: He reports only chronic pain to his back.  Using a feeding tube?: N/A Weight changes, if any:  Wt Readings from Last 3 Encounters:  07/04/18 177 lb 9.6 oz (80.6 kg)  05/06/18 167 lb 3.2 oz (75.8 kg)  04/18/18 169 lb 3.2 oz (76.7 kg)   Swallowing issues, if any: He denies.  Smoking or chewing tobacco? No Using fluoride trays daily? N/A Last ENT visit was on: Dr. Blenda Nicely 3 days after completion of radiation. She performed laryngoscope at the time. He will see her again in 6 weeks.  Other notable issues, if any:  His skin has healed well. He is using aloe as needed to his radiation site.   BP (!) 114/93 (BP Location: Right Arm, Patient Position: Sitting)   Pulse 81   Temp 98.6 F (37 C) (Oral)   Resp 20   Ht 5\' 9"  (1.753 m)   Wt 177 lb 9.6 oz (80.6 kg)   SpO2 98%   BMI 26.23 kg/m

## 2018-07-04 ENCOUNTER — Other Ambulatory Visit: Payer: Self-pay

## 2018-07-04 ENCOUNTER — Ambulatory Visit
Admission: RE | Admit: 2018-07-04 | Discharge: 2018-07-04 | Disposition: A | Discharge status: Home / Self Care | Payer: Medicare HMO | Source: Ambulatory Visit | Attending: Radiation Oncology | Admitting: Radiation Oncology

## 2018-07-04 ENCOUNTER — Encounter: Payer: Self-pay | Admitting: Radiation Oncology

## 2018-07-04 DIAGNOSIS — Z79899 Other long term (current) drug therapy: Secondary | ICD-10-CM | POA: Diagnosis not present

## 2018-07-04 DIAGNOSIS — Z923 Personal history of irradiation: Secondary | ICD-10-CM | POA: Insufficient documentation

## 2018-07-04 DIAGNOSIS — C320 Malignant neoplasm of glottis: Secondary | ICD-10-CM | POA: Insufficient documentation

## 2018-07-04 DIAGNOSIS — Z7982 Long term (current) use of aspirin: Secondary | ICD-10-CM | POA: Diagnosis not present

## 2018-07-04 NOTE — Progress Notes (Signed)
Radiation Oncology         [phone] ________________________________  Name: Aaron Klein MRN: [id number]  Date: 07/04/2018  DOB: 06/06/1946  Follow-Up Visit Note  CC: Aletha Halim., PA-C  Helayne Seminole, MD  Diagnosis and Prior Radiotherapy:       ICD-10-CM   1. Malignant neoplasm of glottis [facility]) C32.0     Cancer Staging Malignant neoplasm of glottis ([place]) Staging form: Larynx - Glottis, AJCC 8th Edition - Clinical: Stage I (cT1a, cN0, cM0) - Signed by Eppie Gibson, MD on 05/06/2018   CHIEF COMPLAINT:  Here for follow-up and surveillance of glottic cancer  Radiation treatment dates: 05/12/2018-06/20/2018  Site/dose: Larynx, 2.25 Gy x 28 fractions for a total dose of 63 Gy  Narrative:  The patient returns today for routine follow-up.  He has been doing well overall. He denies soreness or pain to his radiation site. He tried Nystatin to his anterior neck with relief of his symptoms. His last appointment with Dr. Blenda Nicely was on 06/13/2018 where he was instructed to follow up in 6 weeks.                     ALLERGIES:  has No Known Allergies.  Meds: Current Outpatient Medications  Medication Sig Dispense Refill  . Ascorbic Acid (VITAMIN C) 1000 MG tablet Take 1,000 mg by mouth daily.    Marland Kitchen aspirin EC 81 MG tablet Take 81 mg by mouth daily.    Marland Kitchen atorvastatin (LIPITOR) 10 MG tablet Take 10 mg by mouth daily.     . Cholecalciferol (VITAMIN D3) 1000 units CAPS Take 1,000 Units by mouth daily.     . Flaxseed, Linseed, (FLAXSEED OIL) 1200 MG CAPS Take 2,400 mg by mouth daily.     Marland Kitchen gabapentin (NEURONTIN) 100 MG capsule Take 100 mg by mouth 3 (three) times daily.    Marland Kitchen HYDROcodone-acetaminophen (NORCO) 10-325 MG per tablet Take 1 tablet by mouth every 6 (six) hours as needed for moderate pain.     Marland Kitchen ibuprofen (ADVIL,MOTRIN) 200 MG tablet Take 400 mg by mouth every 6 (six) hours as needed for headache or moderate pain.    . Lecithin 1200 MG CAPS Take 2,400 mg by  mouth daily.     . Multiple Vitamin (MULTIVITAMIN) tablet Take 1 tablet by mouth daily.    . Multiple Vitamins-Minerals (HEALTHY EYES PO) Take 1 capsule by mouth daily.    Marland Kitchen OVER THE COUNTER MEDICATION Take 5,000 mg by mouth daily. CBD Capsule 5000 mg Supplement    . pantoprazole (PROTONIX) 40 MG tablet Take 40 mg by mouth daily.     . sildenafil (VIAGRA) 100 MG tablet Take 100 mg by mouth daily as needed for erectile dysfunction.    . traMADol (ULTRAM) 50 MG tablet Take 50 mg by mouth 2 (two) times daily.    . vitamin E 400 UNIT capsule Take 400 Units by mouth daily.     Marland Kitchen lidocaine (XYLOCAINE) 2 % solution Patient: Mix 1part 2% viscous lidocaine, 1part H20. Swallow 47mL of diluted mixture, 32min before meals and at bedtime, up to QID. (Patient not taking: Reported on 07/04/2018) 100 mL 5   No current facility-administered medications for this encounter.     Physical Findings: The patient is in no acute distress. Patient is alert and oriented. Wt Readings from Last 3 Encounters:  07/04/18 177 lb 9.6 oz (80.6 kg)  05/06/18 167 lb 3.2 oz (75.8 kg)  04/18/18 169 lb 3.2 oz (  76.7 kg)    height is 5\' 9"  (1.753 m) and weight is 177 lb 9.6 oz (80.6 kg). His oral temperature is 98.6 F (37 C). His blood pressure is 114/93 (abnormal) and his pulse is 81. His respiration is 20 and oxygen saturation is 98%. .  General: Alert and oriented, in no acute distress HEENT: Head is normocephalic. Extraocular movements are intact. Oropharynx is notable for oropharynx clear Neck: Neck is notable for residual erythema over anterior neck, but skin is intact and healed well.  Skin: Skin in treatment fields shows satisfactory healing  Lymphatics: see Neck Exam Psychiatric: Judgment and insight are intact. Affect is appropriate.   Lab Findings: Lab Results  Component Value Date   WBC 6.9 04/15/2018   HGB 13.6 04/15/2018   HCT 42.0 04/15/2018   MCV 90.9 04/15/2018   PLT 256 04/15/2018    Lab Results    Component Value Date   TSH 1.630 05/06/2018    Radiographic Findings: No results found.  Impression/Plan:    1) Head and Neck Cancer Status: Healing well from radiotherapy  2) Nutritional Status: No issues PEG tube: N/A  3) Risk Factors: The patient has been educated about risk factors including alcohol and tobacco abuse; they understand that avoidance of alcohol and tobacco is important to prevent recurrences as well as other cancers  4) Swallowing: No issues  5) Thyroid function:  Lab Results  Component Value Date   TSH 1.630 05/06/2018    6) Follow-up in 4 months with TSH. Maintain follow up with ENT specialist, Dr. Blenda Nicely in 4-6 weeks. The patient was encouraged to call with any issues or questions before then.    _____________________________________   Eppie Gibson, MD  This document serves as a record of services personally performed by Eppie Gibson, MD. It was created on her behalf by Steva Colder, a trained medical scribe. The creation of this record is based on the scribe's personal observations and the provider's statements to them. This document has been checked and approved by the attending provider.

## 2018-07-04 NOTE — Progress Notes (Signed)
  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   At the beginning of his treatment, he denied pain or any concerns. He was given Sonafine and instructed on its use. Toward the end of his treatment, he noted pain to radiation site, redness, open areas to anterior neck (using sonafine and neosporin). He denied appetite change. On PE, it was noted moist desquamation in the RT field.    Plan: The patient has completed radiation treatment. The patient will return to radiation oncology clinic for routine followup in one half month. I advised them to call or return sooner if they have any questions or concerns related to their recovery or treatment.  -----------------------------------  Eppie Gibson, MD  This document serves as a record of services personally performed by Eppie Gibson, MD. It was created on her behalf by Steva Colder, a trained medical scribe. The creation of this record is based on the scribe's personal observations and the provider's statements to them. This document has been checked and approved by the attending provider.

## 2018-08-06 ENCOUNTER — Ambulatory Visit: Payer: Medicare HMO | Attending: Radiation Oncology

## 2018-08-06 DIAGNOSIS — R1310 Dysphagia, unspecified: Secondary | ICD-10-CM | POA: Insufficient documentation

## 2018-08-06 NOTE — Therapy (Signed)
[facility] [address] [place], [place], [address] Phone: [phone]   Fax:  [phone]  Speech Language Pathology Treatment  Patient Details  Name: Aaron Klein MRN: [id number] Date of Birth: 01-08-1946 Referring Provider (SLP): Eppie Gibson, MD   Encounter Date: 08/06/2018  End of Session - 08/06/18 1102    Visit Number  3    Number of Visits  4    Date for SLP Re-Evaluation  08/13/18    SLP Start Time  63    SLP Stop Time   7026    SLP Time Calculation (min)  28 min       Past Medical History:  Diagnosis Date  . Arthritis   . GERD (gastroesophageal reflux disease)   . History of hiatal hernia   . Hyperlipidemia   . Irregular heart beat   . Scoliosis     Past Surgical History:  Procedure Laterality Date  . COLONOSCOPY  10-12-2004  . HERNIA REPAIR  1991   inguinal   . MICROLARYNGOSCOPY N/A 04/18/2018   Procedure: SUSPENSION MICRO-DIRECT LARYNGOSTOPY WITH BIOPSY OF VOCAL CORD LESIONS;  Surgeon: Helayne Seminole, MD;  Location: [facility];  Service: ENT;  Laterality: N/A;  . TONSILLECTOMY      There were no vitals filed for this visit.         ADULT SLP TREATMENT - 08/06/18 1035      General Information   Behavior/Cognition  Alert;Cooperative;Pleasant mood      Treatment Provided   Treatment provided  Dysphagia      Dysphagia Treatment   Temperature Spikes Noted  No    Respiratory Status  Room air    Oral Cavity - Dentition  Dentures, bottom;Dentures, top    Patient observed directly with PO's  Yes    Type of PO's observed  Thin liquids;Dysphagia 3 (soft)    Liquids provided via  Cup    Oral Phase Signs & Symptoms  --   none noted   Pharyngeal Phase Signs & Symptoms  --   none noted today   Other treatment/comments  Pt was again modified independent with the HEP - reported he stopped doing exercises for approx one week, got a sore throat and then went back to doing them for  almost a week until today. SLP reiterated to pt to complete HEP 6/7 days/week until 12-27-18, then 2-3 times a week. Pt was WNL with POs today.  SLP provided pt with overt s/s aspiration PNA, which pt stated 3 later in the session. Later in the session pt also told SLP when he could decr frequency of HEP to x2-3/week, correctly.      Assessment / Recommendations / Plan   Plan  Discharge SLP treatment due to (comment)   goals met      SLP Education - 08/06/18 1101    Education Details  s/s aspiration PNA, when to decr frequency of HEP    Person(s) Educated  Patient    Methods  Explanation;Demonstration    Comprehension  Verbalized understanding       SLP Short Term Goals - 08/06/18 1032      SLP SHORT TERM GOAL #1   Title  pt will complete HEP with rare min A over two sessions    Baseline  06-09-18, 08-06-18    Period  --   visits   Status  Achieved      SLP SHORT TERM GOAL #2   Title  pt will  tell SLP why he is completing HEP     Status  Achieved      SLP SHORT TERM GOAL #3   Title  pt will tell SLP how a food journal can foster a quicker return to a more-normalized diet    Status  Achieved       SLP Long Term Goals - 08/06/18 1034      [place] #1   Title  pt will tell SLP 3 overt s/s of aspiration PNA with modified independence    Status  Achieved      SLP LONG TERM GOAL #2   Title  pt will complete HEP with modified independence over two sessions    Baseline  06-09-18,  08-06-18    Time  --    Period  --    Status  Achieved      SLP LONG TERM GOAL #3   Title  pt will tell when he can begin to complete HEP x2/week    Time  --    Period  --    Status  Achieved       Plan - 08/06/18 1102    Clinical Impression Statement  Pt with oropharyngeal swallowing essentially WNL, however the probability of swallowing difficulty still exists with the initiation of radiation therapy. Pt continues with doing excellent job with procedure of HEP. See skilled  intervention for details. Pt is appropriate for d/c at this time and agrees he is ready.     Treatment/Interventions  Aspiration precaution training;Pharyngeal strengthening exercises;Diet toleration management by SLP;Compensatory techniques;Internal/external aids;SLP instruction and feedback;Cueing hierarchy;Trials of upgraded texture/liquids    Potential to Achieve Goals  Good    SLP Home Exercise Plan  provided today    Consulted and Agree with Plan of Care  Patient       Patient will benefit from skilled therapeutic intervention in order to improve the following deficits and impairments:   Dysphagia, unspecified type   SPEECH THERAPY DISCHARGE SUMMARY  Visits from Start of Care: 3  Current functional level related to goals / functional outcomes: Pt reports eating "whatever  I want" and in fact was without overt s/s aspiration again today with cereal bar and water.  Pt wsa modified independent with HEP and agreed today he was ready for d/c.  He was told should he have difficulty with coughing, choking, excessive throat clearing, "wet" voice, or inability to clear food from pharyngeal cavity he should contact his ENT, Dr. Blenda Nicely.   Remaining deficits: None noted.   Education / Equipment: HEP, need to complete HEP daily until 12-27-18, then 2-3x/week, late effects head/neck radiation on swallowing.   Plan: Patient agrees to discharge.  Patient goals were met. Patient is being discharged due to meeting the stated rehab goals.  ?????       Problem List Patient Active Problem List   Diagnosis Date Noted  . 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 [place], [place], [address] Phone: [phone]   Fax:  [phone]   Name: Aaron Klein MRN: [id number] Date of Birth: 01/18/1946

## 2018-08-06 NOTE — Patient Instructions (Addendum)
   Signs of Aspiration Pneumonia   . Chest pain/tightness . Fever (can be low grade) . Cough  o With foul-smelling phlegm (sputum) o With sputum containing pus or blood o With greenish sputum . Fatigue  . Shortness of breath  . Wheezing   **IF YOU HAVE THESE SIGNS, CONTACT YOUR DOCTOR OR GO TO THE EMERGENCY DEPARTMENT OR URGENT CARE AS SOON AS POSSIBLE**     If you have coughing, choking, or inability to move food through the throat, contact Dr. Blenda Nicely and inform her immediately.

## 2018-11-03 ENCOUNTER — Other Ambulatory Visit: Payer: Self-pay | Admitting: Radiation Oncology

## 2018-11-03 ENCOUNTER — Telehealth: Payer: Self-pay | Admitting: *Deleted

## 2018-11-03 NOTE — Telephone Encounter (Signed)
CALLED PATIENT TO ASK ABOUT MOVING LAB AND FU TO LATE September, LAB - 2 PM ON 03-20-19 @ [facility] AND HIS FU HAS BEEN MOVED TO 03/20/19 @ 2:20 PM , AND I HAVE ALSO MOVED HIS FU WITH DR. [place] FROM 12-17-18 TO 11-07-18- ARRIVAL TIME- 9 AM, SPOKE WITH PATIENT AND HE IS AWARE OF THESE APPTS. AND IS GOOD WITH THESE APPTS

## 2018-11-04 ENCOUNTER — Ambulatory Visit: Payer: Medicare HMO

## 2018-11-04 ENCOUNTER — Ambulatory Visit: Payer: Medicare HMO | Admitting: Radiation Oncology

## 2019-03-16 ENCOUNTER — Telehealth: Payer: Self-pay | Admitting: *Deleted

## 2019-03-16 NOTE — Telephone Encounter (Signed)
CALLED PATIENT TO INFORM THAT LAB AND FU HAS BEEN MOVED TO 03-18-19- LAB- 11 AM AND FU 11:20 AM WITH DR. Isidore Moos, LVM FOR A RETURN CALL

## 2019-03-17 NOTE — Progress Notes (Signed)
Aaron Klein presents for follow up of radiation completed 06/20/18 to his larynx.    Pain issues, if any: He denies.  Using a feeding tube?: N/A Weight changes, if any:  Wt Readings from Last 3 Encounters:  03/18/19 178 lb 2 oz (80.8 kg)  07/04/18 177 lb 9.6 oz (80.6 kg)  05/06/18 167 lb 3.2 oz (75.8 kg)   Swallowing issues, if any: He denies.  Smoking or chewing tobacco? No Using fluoride trays daily? N/A Last ENT visit was on: Dr. Blenda Nicely 01/09/19. Laryngoscopy completed that day. She will see him again in November.  Other notable issues, if any:   BP 132/75 (BP Location: Left Arm, Patient Position: Sitting)   Pulse 71   Temp 98 F (36.7 C) (Temporal)   Resp 18   Ht 5\' 10"  (1.778 m)   Wt 178 lb 2 oz (80.8 kg)   SpO2 99%   BMI 25.56 kg/m

## 2019-03-17 NOTE — Progress Notes (Addendum)
Radiation Oncology         [phone] ________________________________  Name: Aaron Klein MRN: [id number]  Date: 03/18/2019  DOB: [birth year]  Follow-Up Visit Note in person  CC: Aletha Halim., PA-C  Helayne Seminole, MD  Diagnosis and Prior Radiotherapy:       ICD-10-CM   1. 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  He was last seen in ENT by Dr. Blenda Nicely on 01/09/2019, with no evidence of recurrence on clinical exam.  On review of systems, he reports eating and swallowing well, skin healed, voice normalized.                    ALLERGIES:  has No Known Allergies.  Meds: Current Outpatient Medications  Medication Sig Dispense Refill  . Ascorbic Acid (VITAMIN C) 1000 MG tablet Take 1,000 mg by mouth daily.    Marland Kitchen aspirin EC 81 MG tablet Take 81 mg by mouth daily.    Marland Kitchen atorvastatin (LIPITOR) 10 MG tablet Take 10 mg by mouth daily.     . Cholecalciferol (VITAMIN D3) 1000 units CAPS Take 1,000 Units by mouth daily.     . Flaxseed, Linseed, (FLAXSEED OIL) 1200 MG CAPS Take 2,400 mg by mouth daily.     Marland Kitchen gabapentin (NEURONTIN) 100 MG capsule Take 100 mg by mouth 3 (three) times daily.    Marland Kitchen HYDROcodone-acetaminophen (NORCO) 10-325 MG per tablet Take 1 tablet by mouth every 6 (six) hours as needed for moderate pain.     Marland Kitchen ibuprofen (ADVIL,MOTRIN) 200 MG tablet Take 400 mg by mouth every 6 (six) hours as needed for headache or moderate pain.    . Lecithin 1200 MG CAPS Take 2,400 mg by mouth daily.     . Multiple Vitamin (MULTIVITAMIN) tablet Take 1 tablet by mouth daily.     . Multiple Vitamins-Minerals (HEALTHY EYES PO) Take 1 capsule by mouth daily.    Marland Kitchen OVER THE COUNTER MEDICATION Take 5,000 mg by mouth daily. CBD Capsule 5000 mg Supplement    . pantoprazole (PROTONIX) 40 MG tablet Take 40 mg by mouth daily.     . sildenafil (VIAGRA) 100 MG tablet Take 100 mg by mouth daily as needed for erectile dysfunction.    . traMADol (ULTRAM) 50 MG tablet Take 50 mg by mouth 2 (two) times daily.    . vitamin E 400 UNIT capsule Take 400 Units by mouth daily.     . celecoxib (CELEBREX) 200 MG capsule     . lidocaine (XYLOCAINE) 2 % solution Patient: Mix 1part 2% viscous lidocaine, 1part H20. Swallow 51mL of diluted mixture, 100min before meals and at bedtime, up to QID. (Patient not taking: Reported on 07/04/2018) 100 mL 5   No current facility-administered medications for this encounter.     Physical Findings: The patient is in no acute distress. Patient is alert and oriented. Wt Readings from Last 3 Encounters:  03/18/19 178 lb 2 oz (80.8 kg)  07/04/18 177 lb 9.6 oz (80.6 kg)  05/06/18 167 lb 3.2 oz (75.8 kg)    height  is 5\' 10"  (1.778 m) and weight is 178 lb 2 oz (80.8 kg). His temporal temperature is 98 F (36.7 C). His blood pressure is 132/75 and his pulse is 71. His respiration is 18 and oxygen saturation is 99%. .  General: Alert and oriented, in no acute distress HEENT: Head is normocephalic. Extraocular movements are intact. Oropharynx is notable for oropharynx clear Neck: Neck is notable slight hyperpigmentation in RT fields, skin is intact and healed well. No palpable masses. Skin: Skin in treatment fields shows satisfactory healing  Lymphatics: see Neck Exam Psychiatric: Judgment and insight are intact. Affect is appropriate.   Lab Findings: Lab Results  Component Value Date   WBC 6.9 04/15/2018   HGB 13.6 04/15/2018   HCT 42.0 04/15/2018   MCV 90.9 04/15/2018   PLT 256 04/15/2018    Lab Results  Component Value Date   TSH 1.630 05/06/2018     Radiographic Findings: No results found.  Impression/Plan:    1) Head and Neck Cancer Status: NED; I explained to the patient that due to a shortage of PPE we cannot do laryngoscopies at the cancer center.  He knows to follow-up in November with Dr. Blenda Nicely and to call her if he has new hoarseness, sore throat, or other symptoms before then.  2) Nutritional Status: No issues PEG tube: N/A  3) Risk Factors: The patient has been educated about risk factors including alcohol and tobacco abuse; they understand that avoidance of alcohol and tobacco is important to prevent recurrences as well as other cancers  4) Swallowing: No issues  5) Thyroid function: Today's results are pending Lab Results  Component Value Date   TSH 1.630 05/06/2018    6) Follow-up in 12 months with repeat TSH testing.  Until then he will follow-up intermittently with otolaryngology as above. The patient was encouraged to call with any issues or questions before then.    _____________________________________   Eppie Gibson, MD  This document serves as a record of services personally performed by Eppie Gibson, MD. It was created on her behalf by Wilburn Mylar, a trained medical scribe. The creation of this record is based on the scribe's personal observations and the provider's statements to them. This document has been checked and approved by the attending provider.

## 2019-03-18 ENCOUNTER — Encounter: Payer: Self-pay | Admitting: Radiation Oncology

## 2019-03-18 ENCOUNTER — Ambulatory Visit
Admission: RE | Admit: 2019-03-18 | Discharge: 2019-03-18 | Disposition: A | Discharge status: Home / Self Care | Payer: Medicare HMO | Source: Ambulatory Visit | Attending: Radiation Oncology | Admitting: Radiation Oncology

## 2019-03-18 ENCOUNTER — Other Ambulatory Visit: Payer: Self-pay

## 2019-03-18 DIAGNOSIS — Z8520 Personal history of malignant neoplasm of unspecified respiratory organ: Secondary | ICD-10-CM | POA: Diagnosis not present

## 2019-03-18 DIAGNOSIS — Z79899 Other long term (current) drug therapy: Secondary | ICD-10-CM | POA: Diagnosis not present

## 2019-03-18 DIAGNOSIS — Z1329 Encounter for screening for other suspected endocrine disorder: Secondary | ICD-10-CM | POA: Diagnosis not present

## 2019-03-18 DIAGNOSIS — Z7982 Long term (current) use of aspirin: Secondary | ICD-10-CM | POA: Diagnosis not present

## 2019-03-18 DIAGNOSIS — C320 Malignant neoplasm of glottis: Secondary | ICD-10-CM

## 2019-03-18 DIAGNOSIS — Z923 Personal history of irradiation: Secondary | ICD-10-CM | POA: Diagnosis not present

## 2019-03-18 LAB — TSH: TSH: 0.944 u[IU]/mL (ref 0.320–4.118)

## 2019-03-20 ENCOUNTER — Ambulatory Visit: Payer: Medicare HMO

## 2019-03-20 ENCOUNTER — Ambulatory Visit: Payer: Medicare HMO | Admitting: Radiation Oncology

## 2019-11-12 ENCOUNTER — Other Ambulatory Visit: Payer: Self-pay

## 2019-11-12 ENCOUNTER — Ambulatory Visit (AMBULATORY_SURGERY_CENTER): Payer: Self-pay | Admitting: *Deleted

## 2019-11-12 VITALS — Ht 67.0 in | Wt 178.4 lb

## 2019-11-12 DIAGNOSIS — Z86010 Personal history of colonic polyps: Secondary | ICD-10-CM

## 2019-11-12 NOTE — Progress Notes (Signed)
Patient denies any allergies to egg or soy products. Patient denies complications with anesthesia/sedation.  Patient denies oxygen use at home and denies diet medications. Emmi instructions for colonoscopy explained and given to patient.  

## 2019-11-25 ENCOUNTER — Other Ambulatory Visit: Payer: Self-pay | Admitting: Gastroenterology

## 2019-11-25 ENCOUNTER — Ambulatory Visit (INDEPENDENT_AMBULATORY_CARE_PROVIDER_SITE_OTHER): Payer: Medicare HMO

## 2019-11-25 DIAGNOSIS — Z1159 Encounter for screening for other viral diseases: Secondary | ICD-10-CM

## 2019-11-25 LAB — SARS CORONAVIRUS 2 (TAT 6-24 HRS): SARS Coronavirus 2: NEGATIVE

## 2019-11-26 ENCOUNTER — Ambulatory Visit (AMBULATORY_SURGERY_CENTER): Payer: Medicare HMO | Admitting: Gastroenterology

## 2019-11-26 ENCOUNTER — Encounter: Payer: Self-pay | Admitting: Gastroenterology

## 2019-11-26 ENCOUNTER — Other Ambulatory Visit: Payer: Self-pay

## 2019-11-26 VITALS — BP 107/73 | HR 69 | Temp 97.1°F | Resp 14 | Ht 67.0 in | Wt 178.4 lb

## 2019-11-26 DIAGNOSIS — K633 Ulcer of intestine: Secondary | ICD-10-CM | POA: Diagnosis not present

## 2019-11-26 DIAGNOSIS — Z86010 Personal history of colonic polyps: Secondary | ICD-10-CM | POA: Diagnosis not present

## 2019-11-26 DIAGNOSIS — K635 Polyp of colon: Secondary | ICD-10-CM | POA: Diagnosis not present

## 2019-11-26 HISTORY — PX: COLONOSCOPY: SHX174

## 2019-11-26 MED ORDER — SODIUM CHLORIDE 0.9 % IV SOLN: 500.0000 mL | Freq: Once | INTRAVENOUS | Status: DC

## 2019-11-26 NOTE — Progress Notes (Signed)
VO 0.2 mg Robinul for pre-procedure brady

## 2019-11-26 NOTE — Op Note (Signed)
[facility] Patient Name: Aaron Klein Procedure Date: 11/26/2019 10:25 AM MRN: [id number] Endoscopist: Justice Britain , MD Age: 74 Referring MD:  Date of Birth: 18-Mar-1946 Gender: Male Account #: [id number] Procedure:                Colonoscopy Indications:              Surveillance: Personal history of adenomatous                            polyps on last colonoscopy 5 years ago Medicines:                Monitored Anesthesia Care Procedure:                Pre-Anesthesia Assessment:                           - Prior to the procedure, a History and Physical                            was performed, and patient medications and                            allergies were reviewed. The patient's tolerance of                            previous anesthesia was also reviewed. The risks                            and benefits of the procedure and the sedation                            options and risks were discussed with the patient.                            All questions were answered, and informed consent                            was obtained. Prior Anticoagulants: The patient has                            taken no previous anticoagulant or antiplatelet                            agents except for aspirin. ASA Grade Assessment: II                            - A patient with mild systemic disease. After                            reviewing the risks and benefits, the patient was                            deemed in satisfactory condition to undergo the  procedure.                           After obtaining informed consent, the colonoscope                            was passed under direct vision. Throughout the                            procedure, the patient's blood pressure, pulse, and                            oxygen saturations were monitored continuously. The                            Colonoscope was introduced through the anus and                        advanced to the the cecum, identified by                            appendiceal orifice and ileocecal valve. The                            colonoscopy was performed without difficulty. The                            patient tolerated the procedure. The quality of the                            bowel preparation was good. The ileocecal valve,                            appendiceal orifice, and rectum were photographed. Scope In: 10:36:16 AM Scope Out: 10:54:13 AM Scope Withdrawal Time: 0 hours 13 minutes 48 seconds  Total Procedure Duration: 0 hours 17 minutes 57 seconds  Findings:                 The digital rectal exam findings include                            hemorrhoids. Pertinent negatives include no                            palpable rectal lesions.                           Many small and large-mouthed diverticula were found                            in the entire colon.                           A segmental area of moderately erythematous mucosa  was found in the recto-sigmoid colon, in the                            sigmoid colon and in the descending colon this was                            noted inbetween the diverticulosis - query SCAD.                            Biopsies were taken with a cold forceps for                            histology.                           Normal mucosa was found in the rest of the colon.                           Non-bleeding non-thrombosed external and internal                            hemorrhoids were found during retroflexion, during                            perianal exam and during digital exam. The                            hemorrhoids were Grade II (internal hemorrhoids                            that prolapse but reduce spontaneously). Complications:            No immediate complications. Estimated Blood Loss:     Estimated blood loss was minimal. Impression:               -  Hemorrhoids found on digital rectal exam.                           - Diverticulosis in the entire examined colon.                           - Erythematous mucosa in the recto-sigmoid colon,                            in the sigmoid colon and in the descending colon                            noted between the diverticulosis - query SCAD.                            Biopsied.                           - Normal mucosa in the rest of the examined colon.                           -  Non-bleeding non-thrombosed external and internal                            hemorrhoids. Recommendation:           - The patient will be observed post-procedure,                            until all discharge criteria are met.                           - Discharge patient to home.                           - Patient has a contact number available for                            emergencies. The signs and symptoms of potential                            delayed complications were discussed with the                            patient. Return to normal activities tomorrow.                            Written discharge instructions were provided to the                            patient.                           - High fiber diet.                           - Use FiberCon 1 tablet PO daily.                           - Continue present medications.                           - Await pathology results.                           - Due to personal history of adenomatous/serrated                            polyps, would normally recommend a 7-year                            colonoscopy for follow up but you will be 80 at                            that time. If your health remains excellent we can  entertain further colonoscopy for surveillance at                            that time but we would need to talk with your PCP                            and with [place] in clinic first.                            - The findings and recommendations were discussed                            with the patient. Justice Britain, MD 11/26/2019 11:03:57 AM

## 2019-11-26 NOTE — Progress Notes (Signed)
Pt's states no medical or surgical changes since previsit or office visit.  ° °Vitals CW °

## 2019-11-26 NOTE — Patient Instructions (Signed)
YOU HAD AN ENDOSCOPIC PROCEDURE TODAY AT [facility]:   Refer to the procedure report that was given to you for any specific questions about what was found during the examination.  If the procedure report does not answer your questions, please call your gastroenterologist to clarify.  If you requested that your care partner not be given the details of your procedure findings, then the procedure report has been included in a sealed envelope for you to review at your convenience later.  YOU SHOULD EXPECT: Some feelings of bloating in the abdomen. Passage of more gas than usual.  Walking can help get rid of the air that was put into your GI tract during the procedure and reduce the bloating. If you had a lower endoscopy (such as a colonoscopy or flexible sigmoidoscopy) you may notice spotting of blood in your stool or on the toilet paper. If you underwent a bowel prep for your procedure, you may not have a normal bowel movement for a few days.  Please Note:  You might notice some irritation and congestion in your nose or some drainage.  This is from the oxygen used during your procedure.  There is no need for concern and it should clear up in a day or so.  SYMPTOMS TO REPORT IMMEDIATELY:   Following lower endoscopy (colonoscopy or flexible sigmoidoscopy):  Excessive amounts of blood in the stool  Significant tenderness or worsening of abdominal pains  Swelling of the abdomen that is new, acute  Fever of 100F or higher    For urgent or emergent issues, a gastroenterologist can be reached at any hour by calling [phone]. Do not use MyChart messaging for urgent concerns.    DIET:  We do recommend a small meal at first, but then you may proceed to your regular diet.  Drink plenty of fluids but you should avoid alcoholic beverages for 24 hours.  ACTIVITY:  You should plan to take it easy for the rest of today and you should NOT DRIVE or use heavy machinery until tomorrow  (because of the sedation medicines used during the test).    FOLLOW UP: Our staff will call the number listed on your records 48-72 hours following your procedure to check on you and address any questions or concerns that you may have regarding the information given to you following your procedure. If we do not reach you, we will leave a message.  We will attempt to reach you two times.  During this call, we will ask if you have developed any symptoms of COVID 19. If you develop any symptoms (ie: fever, flu-like symptoms, shortness of breath, cough etc.) before then, please call [phone].  If you test positive for Covid 19 in the 2 weeks post procedure, please call and report this information to [place].    If any biopsies were taken you will be contacted by phone or by letter within the next 1-3 weeks.  Please call us at [phone] if you have not heard about the biopsies in 3 weeks.    SIGNATURES/CONFIDENTIALITY: You and/or your care partner have signed paperwork which will be entered into your electronic medical record.  These signatures attest to the fact that that the information above on your After Visit Summary has been reviewed and is understood.  Full responsibility of the confidentiality of this discharge information lies with you and/or your care-partner.   Resume medications.Recommend high fiber diet and Fibercon 1 tablet daily. Information given on diverticulosis,high fiber  diet and hemorrhoids.

## 2019-11-26 NOTE — Progress Notes (Signed)
Called to room to assist during endoscopic procedure.  Patient ID and intended procedure confirmed with present staff. Received instructions for my participation in the procedure from the performing physician.  

## 2019-11-26 NOTE — Progress Notes (Signed)
Report to PACU, RN, vss, BBS= Clear.  

## 2019-11-27 ENCOUNTER — Telehealth: Payer: Self-pay | Admitting: Gastroenterology

## 2019-11-27 NOTE — Telephone Encounter (Signed)
Patty, Please check in with the patient. I recommend that he touch base with his primary care doctor and try to get a urinalysis done if possible. I cannot recommend antibiotics without knowing that he has a UTI. Hopefully, if this is a result of some of the anesthesia that he received those effects should be out of his system within the next day or so and should abate. However if the burning and discomfort are significant, then I would recommend the following: 1) drink lots of water and stay hydrated to try and flush the urine Neri tract 2) do not try to hold the urine but rather try to urinate if possible 3) recommend trying over-the-counter AZO (maximum strength urinary pain relief) or AZO (cranberry) and take pills as per directions on the box 4) hopefully see primary care provider in case he ends up really having persistent symptoms or if things worsen 5) if things progress over the weekend he needs to be evaluated in the emergency department or urgent care setting Please call him next week as well to check on him. Thanks. GM

## 2019-11-27 NOTE — Telephone Encounter (Signed)
I spoke with the pt and he agrees to call PCP and follow recommendations given by Dr Rush Landmark.  He is aware to seek ED or urgent care eval if he worsens over the weekend.  He will call next week to update

## 2019-11-30 ENCOUNTER — Encounter: Payer: Self-pay | Admitting: Gastroenterology

## 2019-11-30 ENCOUNTER — Telehealth: Payer: Self-pay | Admitting: *Deleted

## 2019-11-30 NOTE — Telephone Encounter (Signed)
Second Follow up call made, no answer, left message.

## 2019-11-30 NOTE — Telephone Encounter (Signed)
Attempted f/u phone call. No answer. Left message. °

## 2019-12-05 ENCOUNTER — Inpatient Hospital Stay (HOSPITAL_COMMUNITY)
Admit: 2019-12-05 | Discharge: 2019-12-08 | DRG: 683 | Disposition: A | Discharge status: Home Health | Payer: Medicare HMO | Attending: Family Medicine | Admitting: Family Medicine

## 2019-12-05 ENCOUNTER — Emergency Department (HOSPITAL_COMMUNITY): Payer: Medicare HMO

## 2019-12-05 ENCOUNTER — Other Ambulatory Visit: Payer: Self-pay

## 2019-12-05 DIAGNOSIS — N179 Acute kidney failure, unspecified: Principal | ICD-10-CM | POA: Diagnosis present

## 2019-12-05 DIAGNOSIS — Z20822 Contact with and (suspected) exposure to covid-19: Secondary | ICD-10-CM | POA: Diagnosis present

## 2019-12-05 DIAGNOSIS — F17210 Nicotine dependence, cigarettes, uncomplicated: Secondary | ICD-10-CM | POA: Diagnosis present

## 2019-12-05 DIAGNOSIS — R339 Retention of urine, unspecified: Secondary | ICD-10-CM

## 2019-12-05 DIAGNOSIS — Z8521 Personal history of malignant neoplasm of larynx: Secondary | ICD-10-CM

## 2019-12-05 DIAGNOSIS — K219 Gastro-esophageal reflux disease without esophagitis: Secondary | ICD-10-CM | POA: Diagnosis present

## 2019-12-05 DIAGNOSIS — E872 Acidosis: Secondary | ICD-10-CM | POA: Diagnosis present

## 2019-12-05 DIAGNOSIS — I441 Atrioventricular block, second degree: Secondary | ICD-10-CM | POA: Diagnosis present

## 2019-12-05 DIAGNOSIS — N19 Unspecified kidney failure: Secondary | ICD-10-CM | POA: Diagnosis present

## 2019-12-05 DIAGNOSIS — N320 Bladder-neck obstruction: Secondary | ICD-10-CM | POA: Diagnosis present

## 2019-12-05 DIAGNOSIS — R509 Fever, unspecified: Secondary | ICD-10-CM

## 2019-12-05 DIAGNOSIS — R32 Unspecified urinary incontinence: Secondary | ICD-10-CM | POA: Diagnosis present

## 2019-12-05 DIAGNOSIS — E875 Hyperkalemia: Secondary | ICD-10-CM | POA: Diagnosis present

## 2019-12-05 DIAGNOSIS — E871 Hypo-osmolality and hyponatremia: Secondary | ICD-10-CM | POA: Diagnosis present

## 2019-12-05 DIAGNOSIS — Z7982 Long term (current) use of aspirin: Secondary | ICD-10-CM

## 2019-12-05 DIAGNOSIS — M545 Low back pain: Secondary | ICD-10-CM | POA: Diagnosis present

## 2019-12-05 DIAGNOSIS — G8929 Other chronic pain: Secondary | ICD-10-CM | POA: Diagnosis present

## 2019-12-05 DIAGNOSIS — Z79899 Other long term (current) drug therapy: Secondary | ICD-10-CM

## 2019-12-05 DIAGNOSIS — E785 Hyperlipidemia, unspecified: Secondary | ICD-10-CM | POA: Diagnosis present

## 2019-12-05 LAB — URINALYSIS, ROUTINE W REFLEX MICROSCOPIC
Bacteria, UA: NONE SEEN
Bilirubin Urine: NEGATIVE
Glucose, UA: NEGATIVE mg/dL
Ketones, ur: NEGATIVE mg/dL
Nitrite: NEGATIVE
Protein, ur: NEGATIVE mg/dL
Specific Gravity, Urine: 1.01 (ref 1.005–1.030)
pH: 5 (ref 5.0–8.0)

## 2019-12-05 LAB — CBC
HCT: 38.3 % — ABNORMAL LOW (ref 39.0–52.0)
HCT: 34.7 % — ABNORMAL LOW (ref 39.0–52.0)
Hemoglobin: 13.2 g/dL (ref 13.0–17.0)
Hemoglobin: 11.9 g/dL — ABNORMAL LOW (ref 13.0–17.0)
MCH: 29.7 pg (ref 26.0–34.0)
MCH: 29.8 pg (ref 26.0–34.0)
MCHC: 34.5 g/dL (ref 30.0–36.0)
MCHC: 34.3 g/dL (ref 30.0–36.0)
MCV: 86.3 fL (ref 80.0–100.0)
MCV: 86.8 fL (ref 80.0–100.0)
Platelets: 318 10*3/uL (ref 150–400)
Platelets: 270 10*3/uL (ref 150–400)
RBC: 4.44 MIL/uL (ref 4.22–5.81)
RBC: 4 MIL/uL — ABNORMAL LOW (ref 4.22–5.81)
RDW: 12.7 % (ref 11.5–15.5)
RDW: 12.9 % (ref 11.5–15.5)
WBC: 9.9 10*3/uL (ref 4.0–10.5)
WBC: 9.5 10*3/uL (ref 4.0–10.5)
nRBC: 0 % (ref 0.0–0.2)
nRBC: 0 % (ref 0.0–0.2)

## 2019-12-05 LAB — NA AND K (SODIUM & POTASSIUM), RAND UR
Potassium Urine: 19 mmol/L
Sodium, Ur: 32 mmol/L

## 2019-12-05 LAB — BASIC METABOLIC PANEL
Anion gap: 14 (ref 5–15)
Anion gap: 14 (ref 5–15)
BUN: 84 mg/dL — ABNORMAL HIGH (ref 8–23)
BUN: 75 mg/dL — ABNORMAL HIGH (ref 8–23)
CO2: 18 mmol/L — ABNORMAL LOW (ref 22–32)
CO2: 22 mmol/L (ref 22–32)
Calcium: 8.4 mg/dL — ABNORMAL LOW (ref 8.9–10.3)
Calcium: 8.9 mg/dL (ref 8.9–10.3)
Chloride: 86 mmol/L — ABNORMAL LOW (ref 98–111)
Chloride: 86 mmol/L — ABNORMAL LOW (ref 98–111)
Creatinine, Ser: 9.92 mg/dL — ABNORMAL HIGH (ref 0.61–1.24)
Creatinine, Ser: 8.39 mg/dL — ABNORMAL HIGH (ref 0.61–1.24)
GFR calc Af Amer: 5 mL/min — ABNORMAL LOW (ref >60)
GFR calc Af Amer: 6 mL/min — ABNORMAL LOW (ref >60)
GFR calc non Af Amer: 5 mL/min — ABNORMAL LOW (ref >60)
GFR calc non Af Amer: 6 mL/min — ABNORMAL LOW (ref >60)
Glucose, Bld: 91 mg/dL (ref 70–99)
Glucose, Bld: 86 mg/dL (ref 70–99)
Potassium: 6.5 mmol/L (ref 3.5–5.1)
Potassium: 5.5 mmol/L — ABNORMAL HIGH (ref 3.5–5.1)
Sodium: 118 mmol/L — CL (ref 135–145)
Sodium: 122 mmol/L — ABNORMAL LOW (ref 135–145)

## 2019-12-05 LAB — COMPREHENSIVE METABOLIC PANEL
ALT: 21 U/L (ref 0–44)
AST: 21 U/L (ref 15–41)
Albumin: 3.1 g/dL — ABNORMAL LOW (ref 3.5–5.0)
Alkaline Phosphatase: 81 U/L (ref 38–126)
Anion gap: 18 — ABNORMAL HIGH (ref 5–15)
BUN: 84 mg/dL — ABNORMAL HIGH (ref 8–23)
CO2: 18 mmol/L — ABNORMAL LOW (ref 22–32)
Calcium: 8.9 mg/dL (ref 8.9–10.3)
Chloride: 82 mmol/L — ABNORMAL LOW (ref 98–111)
Creatinine, Ser: 10.49 mg/dL — ABNORMAL HIGH (ref 0.61–1.24)
GFR calc Af Amer: 5 mL/min — ABNORMAL LOW (ref >60)
GFR calc non Af Amer: 4 mL/min — ABNORMAL LOW (ref >60)
Glucose, Bld: 90 mg/dL (ref 70–99)
Potassium: 5.7 mmol/L — ABNORMAL HIGH (ref 3.5–5.1)
Sodium: 118 mmol/L — CL (ref 135–145)
Total Bilirubin: 0.4 mg/dL (ref 0.3–1.2)
Total Protein: 7.5 g/dL (ref 6.5–8.1)

## 2019-12-05 LAB — SARS CORONAVIRUS 2 BY RT PCR (HOSPITAL ORDER, PERFORMED IN ~~LOC~~ HOSPITAL LAB): SARS Coronavirus 2: NEGATIVE

## 2019-12-05 LAB — LIPASE, BLOOD: Lipase: 28 U/L (ref 11–51)

## 2019-12-05 LAB — OSMOLALITY: Osmolality: 276 mOsm/kg (ref 275–295)

## 2019-12-05 LAB — OSMOLALITY, URINE: Osmolality, Ur: 247 mOsm/kg — ABNORMAL LOW (ref 300–900)

## 2019-12-05 MED ORDER — SODIUM ZIRCONIUM CYCLOSILICATE 10 G PO PACK
10.0000 g | PACK | Freq: Once | ORAL | Status: AC
  Administered 2019-12-05: 10 g via ORAL
  Filled 2019-12-05: qty 1

## 2019-12-05 MED ORDER — SODIUM ZIRCONIUM CYCLOSILICATE 10 G PO PACK: 10.0000 g | PACK | Freq: Once | ORAL | Status: DC

## 2019-12-05 MED ORDER — STERILE WATER FOR INJECTION IV SOLN
INTRAVENOUS | Status: DC
  Filled 2019-12-05: qty 850

## 2019-12-05 MED ORDER — ACETAMINOPHEN 500 MG PO TABS
1000.0000 mg | ORAL_TABLET | Freq: Four times a day (QID) | ORAL | Status: DC | PRN
  Administered 2019-12-06: 1000 mg via ORAL
  Filled 2019-12-05: qty 2

## 2019-12-05 MED ORDER — FUROSEMIDE 10 MG/ML IJ SOLN
40.0000 mg | Freq: Once | INTRAMUSCULAR | Status: AC
  Administered 2019-12-05: 40 mg via INTRAVENOUS
  Filled 2019-12-05: qty 4

## 2019-12-05 MED ORDER — ONDANSETRON HCL 4 MG PO TABS: 4.0000 mg | ORAL_TABLET | Freq: Three times a day (TID) | ORAL | Status: DC | PRN

## 2019-12-05 MED ORDER — GABAPENTIN 100 MG PO CAPS
100.0000 mg | ORAL_CAPSULE | Freq: Three times a day (TID) | ORAL | Status: DC
  Administered 2019-12-05 – 2019-12-08 (×9): 100 mg via ORAL
  Filled 2019-12-05 (×9): qty 1

## 2019-12-05 MED ORDER — SODIUM CHLORIDE 0.9 % IV BOLUS (SEPSIS)
1000.0000 mL | Freq: Once | INTRAVENOUS | Status: AC
  Administered 2019-12-05: 1000 mL via INTRAVENOUS

## 2019-12-05 MED ORDER — HEPARIN SODIUM (PORCINE) 5000 UNIT/ML IJ SOLN
5000.0000 [IU] | Freq: Three times a day (TID) | INTRAMUSCULAR | Status: DC
  Administered 2019-12-05 – 2019-12-08 (×7): 5000 [IU] via SUBCUTANEOUS
  Filled 2019-12-05 (×8): qty 1

## 2019-12-05 MED ORDER — HYDROCODONE-ACETAMINOPHEN 10-325 MG PO TABS: 1.0000 | ORAL_TABLET | Freq: Four times a day (QID) | ORAL | Status: DC | PRN

## 2019-12-05 MED ORDER — ATORVASTATIN CALCIUM 10 MG PO TABS
10.0000 mg | ORAL_TABLET | Freq: Every day | ORAL | Status: DC
  Administered 2019-12-05 – 2019-12-08 (×4): 10 mg via ORAL
  Filled 2019-12-05 (×4): qty 1

## 2019-12-05 MED ORDER — CALCIUM GLUCONATE-NACL 1-0.675 GM/50ML-% IV SOLN
1.0000 g | Freq: Once | INTRAVENOUS | Status: AC
  Administered 2019-12-05: 1000 mg via INTRAVENOUS
  Filled 2019-12-05: qty 50

## 2019-12-05 MED ORDER — SODIUM CHLORIDE 0.9% FLUSH
3.0000 mL | Freq: Once | INTRAVENOUS | Status: AC
  Administered 2019-12-05: 3 mL via INTRAVENOUS

## 2019-12-05 MED ORDER — SODIUM CHLORIDE 0.9 % IV SOLN
1000.0000 mL | INTRAVENOUS | Status: DC
  Administered 2019-12-05: 1000 mL via INTRAVENOUS

## 2019-12-05 MED ORDER — TAMSULOSIN HCL 0.4 MG PO CAPS
0.4000 mg | ORAL_CAPSULE | Freq: Every day | ORAL | Status: DC
  Administered 2019-12-05 – 2019-12-08 (×4): 0.4 mg via ORAL
  Filled 2019-12-05 (×4): qty 1

## 2019-12-05 MED ORDER — PANTOPRAZOLE SODIUM 40 MG PO TBEC
40.0000 mg | DELAYED_RELEASE_TABLET | Freq: Every day | ORAL | Status: DC
  Administered 2019-12-06 – 2019-12-08 (×3): 40 mg via ORAL
  Filled 2019-12-05 (×3): qty 1

## 2019-12-05 NOTE — ED Notes (Signed)
Pt SpO2 decreased to 88%. Pt reporting mild shortness of breath; bilateral expiratory wheezes present. 3L [place] applied, SpO2 increased to 96%. Dr. Tomi Bamberger notified.

## 2019-12-05 NOTE — Consult Note (Signed)
[facility] Nephrology Consultation Note  Requesting MD: Dr Dorie Rank Reason for consult: AKI, hyponatremia  HPI:  Aaron Klein is a 74 y.o. male with history of acid reflux, HLD, who was presented with generalized abdominal pain after colonoscopy done on 6/3, seen as a consultation at the request of Dr. Tomi Bamberger for acute kidney injury and hyponatremia. Patient reported that he was usually state of health until he had screening colonoscopy done on 6/3 when he started having some abdominal discomfort.  Gradually the pain became worse associated with increased urinary frequency with feeling of incomplete bladder emptying.  The pain was diffuse and worsened to the point that he could not even push to urinate.  He denied dysuria, urgency, chest pain, shortness of breath, nausea, vomiting, fever or chill. He denies use of NSAIDs or any over-the-counter medication except multiple vitamins. In the ER, temperature was 98.1, blood pressure 126/89, oxygen saturation 94% in room air.  The lab showed serum sodium level 118, potassium 5.7, CO2 18, BUN 84, creatinine level 10.49. The baseline serum creatinine level is around 1.27 in 2019. After discussion with the ER patient underwent CT scan of abdomen which showed markedly distended urinary bladder with mild bilateral hydronephrosis.  Patient received a liter of normal saline.  1200 cc urine drained immediately after placement of intraurethral catheter.  Patient stated his abdomen pain and discomfort significantly improved after urinary drainage. His girlfriend at bedside.  He is now being admitted for further evaluation.  Denies history of BPH or any urinary bladder issues.  Creatinine, Ser  Date/Time Value Ref Range Status  12/05/2019 10:36 AM 10.49 (H) 0.61 - 1.24 mg/dL Final  04/18/2018 10:34 AM 1.27 (H) 0.61 - 1.24 mg/dL Final     PMHx:   Past Medical History:  Diagnosis Date  . Arthritis   . Cancer (HCC)    larynx  . ED  (erectile dysfunction)   . GERD (gastroesophageal reflux disease)   . History of hiatal hernia   . Hyperlipidemia   . Irregular heart beat   . Scoliosis     Past Surgical History:  Procedure Laterality Date  . COLONOSCOPY  10/10/2004   Deatra Ina - TA  2016-TA  . COLONOSCOPY  11/26/2019  . HERNIA REPAIR  1991   inguinal   . MICROLARYNGOSCOPY N/A 04/18/2018   Procedure: SUSPENSION MICRO-DIRECT LARYNGOSTOPY WITH BIOPSY OF VOCAL CORD LESIONS;  Surgeon: Helayne Seminole, MD;  Location: [facility] OR;  Service: ENT;  Laterality: N/A;  . TONSILLECTOMY      Family Hx:  Family History  Problem Relation Age of Onset  . Lung cancer Father   . Congestive Heart Failure Father   . Colon polyps Father   . Colon polyps Brother   . Colon cancer Neg Hx   . Rectal cancer Neg Hx   . Stomach cancer Neg Hx   . Esophageal cancer Neg Hx     Social History:  reports that he has quit smoking. His smoking use included cigarettes. He smoked 0.25 packs per day. He has never used smokeless tobacco. He reports that he does not drink alcohol and does not use drugs.  Allergies: No Known Allergies  Medications: Prior to Admission medications   Medication Sig Start Date End Date Taking? Authorizing Provider  Ascorbic Acid (VITAMIN C) 1000 MG tablet Take 1,000 mg by mouth daily.   Yes [provider]  aspirin EC 81 MG tablet Take 81 mg by mouth daily.   Yes [provider]  atorvastatin (LIPITOR) 10 MG tablet Take 10 mg by mouth daily.    Yes [provider]  celecoxib (CELEBREX) 200 MG capsule Take 200 mg by mouth daily.  03/02/19  Yes [provider]  Cholecalciferol (VITAMIN D3) 1000 units CAPS Take 1,000 Units by mouth daily.    Yes [provider]  gabapentin (NEURONTIN) 100 MG capsule Take 100 mg by mouth 3 (three) times daily.   Yes [provider]  HYDROcodone-acetaminophen (NORCO) 10-325 MG per tablet Take 1 tablet by mouth every 6 (six) hours as needed  for moderate pain.    Yes [provider]  ibuprofen (ADVIL,MOTRIN) 200 MG tablet Take 400 mg by mouth every 6 (six) hours as needed for headache or moderate pain.   Yes [provider]  Lecithin 1200 MG CAPS Take 2,400 mg by mouth daily.    Yes [provider]  Multiple Vitamin (MULTIVITAMIN) tablet Take 1 tablet by mouth daily.   Yes [provider]  Multiple Vitamins-Minerals (HEALTHY EYES PO) Take 1 capsule by mouth daily.   Yes [provider]  Omega-3 Fatty Acids (FISH OIL PO) Take 2,000 mg by mouth daily.   Yes [provider]  OVER THE COUNTER MEDICATION Take 5,000 mg by mouth daily. CBD Capsule 5000 mg Supplement   Yes [provider]  pantoprazole (PROTONIX) 40 MG tablet Take 40 mg by mouth daily.    Yes [provider]  traMADol (ULTRAM) 50 MG tablet Take 50 mg by mouth 2 (two) times daily.   Yes [provider]  vitamin E 400 UNIT capsule Take 400 Units by mouth daily.    Yes [provider]  Flaxseed, Linseed, (FLAXSEED OIL) 1200 MG CAPS Take 2,400 mg by mouth daily.     [provider]  sulfamethoxazole-trimethoprim (BACTRIM DS) 800-160 MG tablet Take 1 tablet by mouth 2 (two) times daily. Finished on 12-03-19 11/29/19   [provider]    I have reviewed the patient's current medications.  Labs:  Results for orders placed or performed during the hospital encounter of 12/05/19 (from the past 48 hour(s))  Lipase, blood     Status: None   Collection Time: 12/05/19 10:36 AM  Result Value Ref Range   Lipase 28 11 - 51 U/L    Comment: Performed at [facility], [address], [place], [place] [address]  Comprehensive metabolic panel     Status: Abnormal   Collection Time: 12/05/19 10:36 AM  Result Value Ref Range   Sodium 118 (LL) 135 - 145 mmol/L    Comment: CRITICAL RESULT CALLED TO, READ BACK BY AND VERIFIED WITH: OAKLEY,A RN @ 1212 12/05/19 LEONARD,A    Potassium  5.7 (H) 3.5 - 5.1 mmol/L   Chloride 82 (L) 98 - 111 mmol/L   CO2 18 (L) 22 - 32 mmol/L   Glucose, Bld 90 70 - 99 mg/dL    Comment: Glucose reference range applies only to samples taken after fasting for at least 8 hours.   BUN 84 (H) 8 - 23 mg/dL   Creatinine, Ser 10.49 (H) 0.61 - 1.24 mg/dL   Calcium 8.9 8.9 - 10.3 mg/dL   Total Protein 7.5 6.5 - 8.1 g/dL   Albumin 3.1 (L) 3.5 - 5.0 g/dL   AST 21 15 - 41 U/L   ALT 21 0 - 44 U/L   Alkaline Phosphatase 81 38 - 126 U/L   Total Bilirubin 0.4 0.3 - 1.2 mg/dL  GFR calc non Af Amer 4 (L) >60 mL/min   GFR calc Af Amer 5 (L) >60 mL/min   Anion gap 18 (H) 5 - 15    Comment: Performed at [facility] [address], [place], [place] [address]  CBC     Status: Abnormal   Collection Time: 12/05/19 10:36 AM  Result Value Ref Range   WBC 9.9 4.0 - 10.5 K/uL   RBC 4.44 4.22 - 5.81 MIL/uL   Hemoglobin 13.2 13.0 - 17.0 g/dL   HCT 38.3 (L) 39 - 52 %   MCV 86.3 80.0 - 100.0 fL   MCH 29.7 26.0 - 34.0 pg   MCHC 34.5 30.0 - 36.0 g/dL   RDW 12.7 11.5 - 15.5 %   Platelets 318 150 - 400 K/uL   nRBC 0.0 0.0 - 0.2 %    Comment: Performed at [facility], [place] [address] [place], [place] [address]     ROS:  Pertinent items noted in HPI and remainder of comprehensive ROS otherwise negative.  Physical Exam: Vitals:   12/05/19 1500 12/05/19 1602  BP: (!) 130/95 (!) 134/95  Pulse:  81  Resp: 20 20  Temp:    SpO2:  93%     General exam: Appears calm and comfortable  Respiratory system: Clear to auscultation. Respiratory effort normal. No wheezing or crackle Cardiovascular system: S1 & S2 heard, RRR.  No pedal edema. Gastrointestinal system: Abdomen is nondistended, soft and nontender. Normal bowel sounds heard. Central nervous system: Alert and oriented. No focal neurological deficits. Extremities: Symmetric 5 x 5 power. Skin: No rashes, lesions or ulcers Psychiatry: Judgement and insight appear normal. Mood & affect appropriate.   GU: Urinary bag with clear urine.  Assessment/Plan:  #Acute kidney injury due to obstructive uropathy/bladder outlet obstruction: 1500 cc urine already drained after placement of intraurethral catheter.  His abdomen pain and discomfort markedly improved.  No history of BPH per patient however he will need further evaluation including cystoscopy.  This probably can be done as outpatient.  Agree with starting Flomax. I will check urinalysis. Given hyponatremia I am repeating lab to decide the type of IV fluid. Monitor BMP, strict ins and out.  Avoid nephrotoxins including NSAIDs, IV contrast. He does not have any uremic symptoms, no need for dialysis at the moment.  #Hyponatremia, subacute and asymptomatic: Due to obstructive uropathy causing reduced free water excretion.  He already received a liter of NS.  Repeat BMP now and check every 4 hour.  Goal sodium not more than 126 till tomorrow morning. Check urine sodium, urine osmolality, serum osmolality. Avoid rapid correction.  #Hyperkalemia due to AKI: Received a dose of Lokelma.  IV fluid and relieving urinary obstruction should help.  Follow-up lab result.  #Metabolic acidosis, anion gap due to renal failure: Follow-up lab.  Thank you for the consult.  We will follow with you.  Donnah Levert Tanna Furry 12/05/2019, 4:34 PM  [facility].

## 2019-12-05 NOTE — Progress Notes (Signed)
NEW ADMISSION NOTE New Admission Note:   Arrival Method: E.D. Sctretcher bed Mental Orientation: Alert and oriented x 4 Telemetry:Box #13 called and confirmed. Assessment: Completed Skin:No skin issue except for dryness,skin assessed with Anisha R.N. IV: Left forearm -iv infusing. Pain: Denies Tubes:None Safety Measures: Safety Fall Prevention Plan has been given, discussed and signed Admission: Completed [facility] Orientation: Patient has been orientated to the room, unit and staff.  Family:  Orders have been reviewed and implemented. Will continue to monitor the patient. Call light has been placed within reach and bed alarm has been activated.   [place], Zenon Mayo, RN

## 2019-12-05 NOTE — Progress Notes (Signed)
Nephrology follow-up: Repeat lab reviewed.  Sodium 118, potassium 6.5, CO2 18, BUN 84, creatinine level 9.92.  Continue to have urinary drainage. Plan: Ordered a dose of Lokelma Start sodium bicarbonate IV A dose of Lasix 40 mg IV Repeat lab every 4 hour  D. Carolin Sicks, MD

## 2019-12-05 NOTE — ED Notes (Signed)
Attempted report x1. 

## 2019-12-05 NOTE — ED Notes (Signed)
Ask patient for urine sample, patient stated that he did not need to urinate at this time.

## 2019-12-05 NOTE — ED Provider Notes (Signed)
[facility] EMERGENCY DEPARTMENT Provider Note   CSN: [id number] Arrival date & time: 12/05/19  1017     History Chief Complaint  Patient presents with   Abdominal Pain    Aaron Klein is a 74 y.o. male.  The history is provided by the patient.  Abdominal Pain Pain location:  RLQ Pain quality comment:  Soreness Pain radiates to:  LLQ Pain severity:  Moderate Onset quality:  Gradual Timing:  Constant Progression:  Worsening Context comment:  Recent colonoscopy prior to this Relieved by:  Nothing Worsened by:  Nothing Ineffective treatments: pt was started on abx at a medical clinic, told to come to ed if not better. Associated symptoms: dysuria   Associated symptoms: no diarrhea, no fever and no vomiting   Patient states his symptoms started after having a colonoscopy on June 3.  He was having lower abdominal pain.  He went to a clinic where he was told he might have an infection in the intestine or urine so he was started on antibiotics.  Patient did not feel like he was getting any better.  Pain is primarily in the right lower abdomen.     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    Past Surgical History:  Procedure Laterality Date   COLONOSCOPY  10/10/2004   Deatra Ina - TA  [id number]   COLONOSCOPY  11/26/2019   HERNIA REPAIR  1991   inguinal    MICROLARYNGOSCOPY N/A 04/18/2018   Procedure: SUSPENSION MICRO-DIRECT LARYNGOSTOPY WITH BIOPSY OF VOCAL CORD LESIONS;  Surgeon: Helayne Seminole, MD;  Location: [place];  Service: ENT;  Laterality: N/A;   TONSILLECTOMY         Family History  Problem  Relation Age of Onset   Lung cancer Father    Congestive Heart Failure Father    Colon polyps Father    Colon polyps Brother    Colon cancer Neg Hx    Rectal cancer Neg Hx    Stomach cancer Neg Hx    Esophageal cancer Neg Hx     Social History   Tobacco Use   Smoking status: Former Smoker    Packs/day: 0.25    Types: Cigarettes   Smokeless tobacco: Never Used   Tobacco comment: quit 04/25/18 but started back smoking  Vaping Use   Vaping Use: Never used  Substance Use Topics   Alcohol use: No    Alcohol/week: 0.0 standard drinks   Drug use: No    Home Medications Prior to Admission medications   Medication Sig Start Date End Date Taking? Authorizing Provider  Ascorbic Acid (VITAMIN C) 1000 MG tablet Take 1,000 mg by mouth daily.   Yes [provider]  aspirin EC 81 MG tablet Take 81 mg by mouth daily.   Yes [provider]  atorvastatin (LIPITOR) 10 MG tablet Take 10 mg by mouth daily.    Yes [provider]  celecoxib (CELEBREX) 200 MG capsule Take 200 mg by mouth daily.  03/02/19  Yes [provider]  Cholecalciferol (VITAMIN D3) 1000 units CAPS Take  1,000 Units by mouth daily.    Yes [provider]  gabapentin (NEURONTIN) 100 MG capsule Take 100 mg by mouth 3 (three) times daily.   Yes [provider]  HYDROcodone-acetaminophen (NORCO) 10-325 MG per tablet Take 1 tablet by mouth every 6 (six) hours as needed for moderate pain.    Yes [provider]  ibuprofen (ADVIL,MOTRIN) 200 MG tablet Take 400 mg by mouth every 6 (six) hours as needed for headache or moderate pain.   Yes [provider]  Lecithin 1200 MG CAPS Take 2,400 mg by mouth daily.    Yes [provider]  Multiple Vitamin (MULTIVITAMIN) tablet Take 1 tablet by mouth daily.   Yes [provider]  Multiple Vitamins-Minerals (HEALTHY EYES PO) Take 1 capsule by mouth daily.   Yes [provider]  Omega-3  Fatty Acids (FISH OIL PO) Take 2,000 mg by mouth daily.   Yes [provider]  OVER THE COUNTER MEDICATION Take 5,000 mg by mouth daily. CBD Capsule 5000 mg Supplement   Yes [provider]  pantoprazole (PROTONIX) 40 MG tablet Take 40 mg by mouth daily.    Yes [provider]  traMADol (ULTRAM) 50 MG tablet Take 50 mg by mouth 2 (two) times daily.   Yes [provider]  vitamin E 400 UNIT capsule Take 400 Units by mouth daily.    Yes [provider]  Flaxseed, Linseed, (FLAXSEED OIL) 1200 MG CAPS Take 2,400 mg by mouth daily.     [provider]  sulfamethoxazole-trimethoprim (BACTRIM DS) 800-160 MG tablet Take 1 tablet by mouth 2 (two) times daily. Finished on 12-03-19 11/29/19   [provider]    Allergies    Patient has no known allergies.  Review of Systems   Review of Systems  Constitutional: Negative for fever.  Gastrointestinal: Positive for abdominal pain. Negative for diarrhea and vomiting.  Genitourinary: Positive for dysuria.  All other systems reviewed and are negative.   Physical Exam Updated Vital Signs BP (!) 130/95    Pulse 82    Temp 98.1 F (36.7 C) (Oral)    Resp 20    Ht 1.727 m (5\' 8" )    Wt 79.4 kg    SpO2 96%    BMI 26.61 kg/m   Physical Exam Vitals and nursing note reviewed.  Constitutional:      General: He is not in acute distress.    Appearance: He is well-developed.  HENT:     Head: Normocephalic and atraumatic.     Right Ear: External ear normal.     Left Ear: External ear normal.  Eyes:     General: No scleral icterus.       Right eye: No discharge.        Left eye: No discharge.     Conjunctiva/sclera: Conjunctivae normal.  Neck:     Trachea: No tracheal deviation.  Cardiovascular:     Rate and Rhythm: Normal rate and regular rhythm.  Pulmonary:     Effort: Pulmonary effort is normal. No respiratory distress.     Breath sounds: Normal breath sounds. No stridor. No wheezing or  rales.  Abdominal:     General: Bowel sounds are normal. There is no distension.     Palpations: Abdomen is soft.     Tenderness: There is abdominal tenderness in the right lower quadrant. There is no guarding or rebound.  Musculoskeletal:        General: No tenderness.  Cervical back: Neck supple.  Skin:    General: Skin is warm and dry.     Findings: No rash.  Neurological:     Mental Status: He is alert.     Cranial Nerves: No cranial nerve deficit (no facial droop, extraocular movements intact, no slurred speech).     Sensory: No sensory deficit.     Motor: No abnormal muscle tone or seizure activity.     Coordination: Coordination normal.     ED Results / Procedures / Treatments   Labs (all labs ordered are listed, but only abnormal results are displayed) Labs Reviewed  COMPREHENSIVE METABOLIC PANEL - Abnormal; Notable for the following components:      Result Value   Sodium 118 (*)    Potassium 5.7 (*)    Chloride 82 (*)    CO2 18 (*)    BUN 84 (*)    Creatinine, Ser 10.49 (*)    Albumin 3.1 (*)    GFR calc non Af Amer 4 (*)    GFR calc Af Amer 5 (*)    Anion gap 18 (*)    All other components within normal limits  CBC - Abnormal; Notable for the following components:   HCT 38.3 (*)    All other components within normal limits  SARS CORONAVIRUS 2 BY RT PCR (HOSPITAL ORDER, [facility])  LIPASE, BLOOD  URINALYSIS, ROUTINE W REFLEX MICROSCOPIC    EKG EKG Interpretation  Date/Time:  Saturday December 05 2019 14:02:42 EDT Ventricular Rate:  61 PR Interval:    QRS Duration: 104 QT Interval:  384 QTC Calculation: 387 R Axis:   77 Text Interpretation: Sinus arrhythmia Prolonged PR interval Anterior infarct, old No significant change since last tracing Confirmed by Dorie Rank [phone]) on 12/05/2019 2:20:49 PM   Radiology CT Renal Stone Study  Result Date: 12/05/2019 CLINICAL DATA:  Diffuse severe abdominal pain since colonoscopy 9 days  ago. Acute renal failure. EXAM: CT ABDOMEN AND PELVIS WITHOUT CONTRAST TECHNIQUE: Multidetector CT imaging of the abdomen and pelvis was performed following the standard protocol without IV contrast. COMPARISON:  None. FINDINGS: Lower chest: Trace left greater than right pleural effusions with adjacent lower lobe subsegmental atelectasis. 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 Hepatobiliary: No focal liver abnormality. Small calcified granuloma right inferior liver. Small gallstones. No gallbladder wall thickening or biliary dilatation. Pancreas: Unremarkable. No pancreatic ductal dilatation or surrounding inflammatory changes. Spleen: Normal in size without focal abnormality. Adrenals/Urinary Tract: The adrenal glands are unremarkable. Mild symmetric bilateral hydroureteronephrosis. Prominent bilateral retroperitoneal simple fluid, extending to the inguinal ligament on the right. No retroperitoneal hematoma. Markedly distended, thin walled bladder. Stomach/Bowel: Stomach is within normal limits. Appendix appears normal (series 6, image 18). No evidence of bowel wall thickening, distention, or inflammatory changes. Moderate left-sided colonic diverticulosis. Vascular/Lymphatic: Aortic atherosclerosis. No enlarged abdominal or pelvic lymph nodes. Reproductive: Mild prostatomegaly. Other: Trace free fluid in the pelvis.  No pneumoperitoneum. Musculoskeletal: No acute or significant osseous findings. Severe levoscoliosis of the lumbar spine. IMPRESSION: 1. Markedly distended bladder with mild symmetric bilateral hydroureteronephrosis, suggestive of bladder outlet obstruction. Prominent bilateral retroperitoneal simple fluid may reflect urine leakage. No retroperitoneal hematoma. 2. No pneumoperitoneum or evidence of bowel perforation. 3. Trace left greater than right pleural effusions. 4. 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 5. Cholelithiasis. 6. Aortic  Atherosclerosis (ICD10-I70.0). Electronically Signed   By: Titus Dubin M.D.   On: 12/05/2019 14:30    Procedures .Critical  Care Performed by: Dorie Rank, MD Authorized by: Dorie Rank, MD   Critical care provider statement:    Critical care time (minutes):  45   Critical care was time spent personally by me on the following activities:  Discussions with consultants, evaluation of patient's response to treatment, examination of patient, ordering and performing treatments and interventions, ordering and review of laboratory studies, ordering and review of radiographic studies, pulse oximetry, re-evaluation of patient's condition, obtaining history from patient or surrogate and review of old charts   (including critical care time)  Medications Ordered in ED Medications  sodium chloride 0.9 % bolus 1,000 mL (1,000 mLs Intravenous New Bag/Given 12/05/19 1435)    Followed by  0.9 %  sodium chloride infusion (1,000 mLs Intravenous New Bag/Given 12/05/19 1438)  sodium chloride flush (NS) 0.9 % injection 3 mL (3 mLs Intravenous Given 12/05/19 1434)  sodium zirconium cyclosilicate (LOKELMA) packet 10 g (10 g Oral Given 12/05/19 1431)    ED Course  I have reviewed the triage vital signs and the nursing notes.  Pertinent labs & imaging results that were available during my care of the patient were reviewed by me and considered in my medical decision making (see chart for details).  Clinical Course as of Dec 05 1526  Sat Dec 05, 2019  1313 Patient's metabolic panel was notable for acute renal failure.  Patient has hyponatremia.  Slight hyperkalemia.  Elevated anion gap acidosis.   [ES]  9233 Discussed case with Dr Carolin Sicks. He will consult on patient.  Will continue fluids, dose of lokelma right now.   [AQ]  7622 Patient CT scan shows severe bladder outlet obstruction with distended bladder and hydronephrosis.  This is most likely cause of acute renal failure.  I have ordered a Foley catheter    [JK]    Clinical Course User Index [JK] Dorie Rank, MD   MDM Rules/Calculators/A&P                          Patient presents to the ED for evaluation of abdominal pain.  Patient recently had a colonoscopy.  Patient's laboratory tests were notable for acute renal failure.  Initially felt this could be related to dehydration from his bowel prep but certainly need to rule out bladder outlet obstruction and any complications from his colonoscopy.  CT scan does not show any bowel complications but does demonstrate markedly distended bladder and hydronephrosis.  His renal failure is likely due to his bladder outlet obstruction although there could be a component from dehydration.  I consulted with with Dr. Carolin Sicks nephrology.  He will consult on the patient.  Foley catheter has been ordered.  I will consult the medical service for admission and further treatment. Final Clinical Impression(s) / ED Diagnoses Final diagnoses:  Acute renal failure, unspecified acute renal failure type [facility])  Urinary retention    Rx / DC Orders ED Discharge Orders    None       Dorie Rank, MD 12/05/19 1530

## 2019-12-05 NOTE — Progress Notes (Signed)
FPTS Interim Progress Note  Repeat BMP showing K 5.5. S/p lokelma and PO lasix @1857 . EKG reviewed from when K was 6.5. T waves appear more peaked compared to EKG in 2019.   Will plan to give lokelma and IV Ca gluconate. Repeat BMP ordered  Caroline More, DO 12/05/2019, 9:31 PM PGY-3, [facility] Service pager [phone]

## 2019-12-05 NOTE — H&P (Addendum)
[facility] Admission History and Physical Service Pager: [phone]  Patient name: Aaron Klein Medical record number: [id number] Date of birth: 12-23-1945 Age: 74 y.o. Gender: male  Primary Care Provider: Aletha Halim., PA-C Consultants: Nephro Code Status: Full code  Chief Complaint: not urinating, stomach pain  Assessment and Plan: CAITLIN HILLMER is a 74 y.o. male presenting with acute renal failure secondary to bladder outlet obstruction. PMH is significant for laryngeal cancer, GERD, HLD.  Acute renal failure 2/2 post renal obstruction Patient had colonoscopy Thursday 6/3 and since that time has had extreme difficulty urinating.  He reports he woke up with nights and had had urinary incontinence in the bed.  He reports that there is mornings he woke up and his abdomen felt better throughout the day would worsen.  He has decreased his p.o. liquid and solid intake due to the abdominal pain and distention.  Patient presented to the emergency department and was evaluated.  He was found to be in acute renal failure with a creatinine of 10.49.Marland Kitchen  CT renal scan was obtained and was consistent with bladder outlet obstruction..  Nephrology was consulted and recommended Foley catheter placement and admission for further evaluation.  Acute renal failure most likely due to postrenal obstruction.  Patient has no signs or symptoms of infection.  Patient is not on no high doses of nephrotoxic medications which could cause his renal failure.  Lower suspicion that BPH is significantly contributing based on only mild prostatic megaly noted on CT today. -Admit to inpatient telemetry service -Nephrology following, appreciate recommendations -Flomax initiated -We will need evaluation for BPH outpatient -Strict I's and O's -Avoid nephrotoxic agents including NSAIDs and contrast -No need for dialysis at this moment but if begins to show uremic symptoms that may be  necessary -BMPs every 4 hours   hyperkalemia On admission sodium was 118, potassium 5.7, BUN 84.  This is most likely in the setting of acute renal failure with hypervolemia.  Patient was given lokelma,.  Repeat labs showed sodium stable at 118, potassium of 6.5, CO2 18, BUN 84, creatinine 9.92. -Nephrology is following, appreciate recommendations -Second dose of Lokelma ordered -Sodium bicarb IV started -Every 4 hour BMP  Hyponatremia Sodium 118 on admission and 118 again on repeat.  Secondary to acute renal failure.  Per renal recommendations, slow correction is recommended no faster than a change to 126 by tomorrow morning.  Based on stable sodium a 4-hour repeat, he does not currently seem to be in danger of a rapid overcorrection.  No evidence of neurological symptoms on admission. -40 mg IV Lasix -Every 4 hours BMP  Uremia BUN of 84 on admission.  Some nausea and stomach pain noted in addition to decreased appetite.  Resolution is expected now that his outlet obstruction has been relieved. -Zofran as needed  Weakness Patient reports that the weakness has been going on since having his colonoscopy.  He reports poor p.o. intake and little fluid intake as well out of concern that he will make his abdominal pain worse.  He reports that the weakness is in his legs and that he feels a little unsteady on his feet.  This is most likely related to patient's electrolyte disturbances in the setting of acute renal failure. 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  Currently in remission at this time  GERD Home medications include Protonix 40 mg daily -Continue  home medications  Hyperlipidemia Patient is on atorvastatin 10 mg daily. -Continue home atorvastatin  Chronic pain, low back He reports taking Norco 10 regularly for his low back pain in addition to the Celebrex.  He denies other  NSAID use.  PDMP reviewed. -Hold Celebrex due to acute renal failure -Holding hydrocodone due to extended half-life in the setting of acute renal failure -Tylenol and K pad as needed for back pain -Consider gabapentin for worsened back pain  Nicotine use disorder He reports smoking roughly 1/4 packs/day.  He is not interested in a nicotine patch. -Nicotine patch if desired  History of laryngeal cancer Currently in remission.  No work-up necessary at this time.  FEN/GI: Regular diet Prophylaxis: Heparin  Disposition: Admit to telemetry  History of Present Illness:  Aaron Klein is a 74 y.o. male presenting with abdominal pain and urinary retention.  Patient reports that he had a colonoscopy on 6/3 and since that time he has had issues urinating.  He will urinate a tiny bit each day but has woken up twice over the past week and a half with urinary incontinence.  Patient also reports he is not had a bowel movement since that time.  Over the last week his abdominal pain has slowly worsened and worsened until he had to come in today.  Patient reports that he has had colonoscopies in the past and has never had this issue.  Reports decreased p.o. intake of liquids and solids because he thought that it would make his abdominal pain worse.  Patient denies any fever or chills, vomiting, swelling in legs.  He reports he feels less steady on his feet since this is all occurred.  He attributes it to his poor p.o. intake and decreased energy.    Review Of Systems: Per HPI with the following additions:   Review of Systems  Constitutional: Negative for chills and fever.  Respiratory: Negative for shortness of breath.   Cardiovascular: Negative for chest pain.  Gastrointestinal: Positive for constipation (Patient denies bowel movement over the last week but does report having gas).  Genitourinary: Positive for flank pain and frequency (Decreased frequency).  Musculoskeletal: Positive for back pain  (Chronic).  Neurological: Positive for weakness (With ambulation). Negative for headaches.    Patient Active Problem List   Diagnosis Date Noted  . Laryngeal cancer ([place]) 05/16/2018  . 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 Arthritis   . Cancer (HCC)    larynx  . ED (erectile dysfunction)   . GERD (gastroesophageal reflux disease)   . History of hiatal hernia   . Hyperlipidemia   . Irregular heart beat   . Scoliosis     Past Surgical History: Past Surgical History:  Procedure Laterality Date  . COLONOSCOPY  10/10/2004   Deatra Ina - TA  2016-TA  . COLONOSCOPY  11/26/2019  . HERNIA REPAIR  1991   inguinal   . MICROLARYNGOSCOPY N/A 04/18/2018   Procedure: SUSPENSION MICRO-DIRECT LARYNGOSTOPY WITH BIOPSY OF VOCAL CORD LESIONS;  Surgeon: Helayne Seminole, MD;  Location: [facility];  Service: ENT;  Laterality: N/A;  . TONSILLECTOMY      Social History: Social History   Tobacco Use  . Smoking status: Former Smoker    Packs/day: 0.25    Types: Cigarettes  . Smokeless tobacco: Never Used  . Tobacco comment: quit 04/25/18 but started back smoking  Vaping Use  . Vaping Use: Never used  Substance Use Topics  . Alcohol use: No    Alcohol/week: 0.0 standard drinks  . Drug use: No   Additional social history:  Please also refer to relevant sections of EMR.  Family History: Family History  Problem Relation Age of Onset  . Lung cancer Father   . Congestive Heart Failure Father   . Colon polyps Father   . Colon polyps Brother   . Colon cancer Neg Hx   . Rectal cancer Neg Hx   . Stomach cancer Neg Hx   . Esophageal cancer Neg Hx    Allergies and Medications: No Known Allergies No current facility-administered medications on file prior to encounter.   Current Outpatient Medications on File Prior to Encounter  Medication Sig Dispense Refill  . Ascorbic Acid (VITAMIN C) 1000 MG tablet Take 1,000  mg by mouth daily.    Marland Kitchen aspirin EC 81 MG tablet Take 81 mg by mouth daily.    Marland Kitchen atorvastatin (LIPITOR) 10 MG tablet Take 10 mg by mouth daily.     . celecoxib (CELEBREX) 200 MG capsule Take 200 mg by mouth daily.     . Cholecalciferol (VITAMIN D3) 1000 units CAPS Take 1,000 Units by mouth daily.     Marland Kitchen gabapentin (NEURONTIN) 100 MG capsule Take 100 mg by mouth 3 (three) times daily.    Marland Kitchen HYDROcodone-acetaminophen (NORCO) 10-325 MG per tablet Take 1 tablet by mouth every 6 (six) hours as needed for moderate pain.     Marland Kitchen ibuprofen (ADVIL,MOTRIN) 200 MG tablet Take 400 mg by mouth every 6 (six) hours as needed for headache or moderate pain.    . Lecithin 1200 MG CAPS Take 2,400 mg by mouth daily.     . Multiple Vitamin (MULTIVITAMIN) tablet Take 1 tablet by mouth daily.    . Multiple Vitamins-Minerals (HEALTHY EYES PO) Take 1 capsule by mouth daily.    . Omega-3 Fatty Acids (FISH OIL PO) Take 2,000 mg by mouth daily.    Marland Kitchen OVER THE COUNTER MEDICATION Take 5,000 mg by mouth daily. CBD Capsule 5000 mg Supplement    . pantoprazole (PROTONIX) 40 MG tablet Take 40 mg by mouth daily.     . traMADol (ULTRAM) 50 MG tablet Take 50 mg by mouth 2 (two) times daily.    . vitamin E 400 UNIT capsule Take 400 Units by mouth daily.     . Flaxseed, Linseed, (FLAXSEED OIL) 1200 MG CAPS Take 2,400 mg by mouth daily.     Marland Kitchen sulfamethoxazole-trimethoprim (BACTRIM DS) 800-160 MG tablet Take 1 tablet by mouth 2 (two) times daily. Finished on 12-03-19      Objective: BP (!) 130/95   Pulse 82   Temp 98.1 F (36.7 C) (Oral)   Resp 20   Ht 5\' 8"  (1.727 m)   Wt 79.4 kg   SpO2 96%   BMI 26.61 kg/m  Physical Exam Vitals reviewed.  Constitutional:      Appearance: He is well-developed. He is not ill-appearing.     Comments: Patient appears uncomfortable and says it is due to his lower abdominal pain.  HENT:     Head: Normocephalic and atraumatic.     Mouth/Throat:     Mouth: Mucous membranes are moist.  Eyes:      Extraocular Movements: Extraocular movements intact.     Pupils: Pupils are equal, round, and reactive to light.  Cardiovascular:     Rate and Rhythm: Normal rate and regular rhythm.     Heart sounds:  Normal heart sounds.  Pulmonary:     Effort: Pulmonary effort is normal.     Breath sounds: Wheezing (Patient has occasional wheezes) present.     Comments: Patient has crackles in his lung bases bilaterally Chest:     Chest wall: No tenderness.  Abdominal:     General: Bowel sounds are normal. There is distension.     Comments: Tenderness to palpation abdominal discomfort  Musculoskeletal:        General: No swelling or tenderness.  Skin:    General: Skin is warm and dry.  Neurological:     General: No focal deficit present.     Mental Status: He is alert.  Psychiatric:        Mood and Affect: Mood normal.        Behavior: Behavior normal.        Thought Content: Thought content normal.     Labs and Imaging: CBC BMET  Recent Labs  Lab 12/05/19 1036  WBC 9.9  HGB 13.2  HCT 38.3*  PLT 318   Recent Labs  Lab 12/05/19 1036  NA 118*  K 5.7*  CL 82*  CO2 18*  BUN 84*  CREATININE 10.49*  GLUCOSE 90  CALCIUM 8.9     CT Renal Stone Study  Result Date: 12/05/2019 CLINICAL DATA:  Diffuse severe abdominal pain since colonoscopy 9 days ago. Acute renal failure. EXAM: CT ABDOMEN AND PELVIS WITHOUT CONTRAST TECHNIQUE: Multidetector CT imaging of the abdomen and pelvis was performed following the standard protocol without IV contrast. COMPARISON:  None. FINDINGS: Lower chest: Trace left greater than right pleural effusions with adjacent lower lobe subsegmental atelectasis. 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 Hepatobiliary: No focal liver abnormality. Small calcified granuloma right inferior liver. Small gallstones. No gallbladder wall thickening or biliary dilatation. Pancreas: Unremarkable. No pancreatic ductal dilatation or surrounding inflammatory  changes. Spleen: Normal in size without focal abnormality. Adrenals/Urinary Tract: The adrenal glands are unremarkable. Mild symmetric bilateral hydroureteronephrosis. Prominent bilateral retroperitoneal simple fluid, extending to the inguinal ligament on the right. No retroperitoneal hematoma. Markedly distended, thin walled bladder. Stomach/Bowel: Stomach is within normal limits. Appendix appears normal (series 6, image 18). No evidence of bowel wall thickening, distention, or inflammatory changes. Moderate left-sided colonic diverticulosis. Vascular/Lymphatic: Aortic atherosclerosis. No enlarged abdominal or pelvic lymph nodes. Reproductive: Mild prostatomegaly. Other: Trace free fluid in the pelvis.  No pneumoperitoneum. Musculoskeletal: No acute or significant osseous findings. Severe levoscoliosis of the lumbar spine. IMPRESSION: 1. Markedly distended bladder with mild symmetric bilateral hydroureteronephrosis, suggestive of bladder outlet obstruction. Prominent bilateral retroperitoneal simple fluid may reflect urine leakage. No retroperitoneal hematoma. 2. No pneumoperitoneum or evidence of bowel perforation. 3. Trace left greater than right pleural effusions. 4. 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 5. Cholelithiasis. 6. Aortic Atherosclerosis (ICD10-I70.0). Electronically Signed   By: Titus Dubin M.D.   On: 12/05/2019 14:30     Gifford Shave, MD 12/05/2019, 3:31 PM PGY-1, [place] Intern pager: [phone], text pages welcome  FPTS Upper-Level Resident Addendum   I have independently interviewed and examined the patient. I have discussed the above with the original author and agree with their documentation. My edits for correction/addition/clarification are in blue. Please see also any attending notes.    Matilde Haymaker MD PGY-2, [facility] 12/05/2019 9:22 PM  [facility] Service pager: [phone] (text pages welcome through  [facility])

## 2019-12-05 NOTE — ED Triage Notes (Signed)
Pt sts he had a colonoscopy on 6/3 and ever since then has severe generalized abdominal pain. No bowel movement since procedure. Denies n/v.

## 2019-12-06 ENCOUNTER — Observation Stay (HOSPITAL_COMMUNITY): Payer: Medicare HMO

## 2019-12-06 ENCOUNTER — Encounter (HOSPITAL_COMMUNITY): Payer: Self-pay | Admitting: Student

## 2019-12-06 DIAGNOSIS — Z8521 Personal history of malignant neoplasm of larynx: Secondary | ICD-10-CM | POA: Diagnosis not present

## 2019-12-06 DIAGNOSIS — I441 Atrioventricular block, second degree: Secondary | ICD-10-CM | POA: Diagnosis present

## 2019-12-06 DIAGNOSIS — Z7982 Long term (current) use of aspirin: Secondary | ICD-10-CM | POA: Diagnosis not present

## 2019-12-06 DIAGNOSIS — E785 Hyperlipidemia, unspecified: Secondary | ICD-10-CM | POA: Diagnosis present

## 2019-12-06 DIAGNOSIS — E875 Hyperkalemia: Secondary | ICD-10-CM | POA: Diagnosis present

## 2019-12-06 DIAGNOSIS — M545 Low back pain: Secondary | ICD-10-CM | POA: Diagnosis present

## 2019-12-06 DIAGNOSIS — N179 Acute kidney failure, unspecified: Secondary | ICD-10-CM | POA: Diagnosis present

## 2019-12-06 DIAGNOSIS — E871 Hypo-osmolality and hyponatremia: Secondary | ICD-10-CM | POA: Diagnosis present

## 2019-12-06 DIAGNOSIS — N320 Bladder-neck obstruction: Secondary | ICD-10-CM | POA: Diagnosis present

## 2019-12-06 DIAGNOSIS — E872 Acidosis: Secondary | ICD-10-CM | POA: Diagnosis present

## 2019-12-06 DIAGNOSIS — Z20822 Contact with and (suspected) exposure to covid-19: Secondary | ICD-10-CM | POA: Diagnosis present

## 2019-12-06 DIAGNOSIS — G8929 Other chronic pain: Secondary | ICD-10-CM | POA: Diagnosis present

## 2019-12-06 DIAGNOSIS — R32 Unspecified urinary incontinence: Secondary | ICD-10-CM | POA: Diagnosis present

## 2019-12-06 DIAGNOSIS — F17210 Nicotine dependence, cigarettes, uncomplicated: Secondary | ICD-10-CM | POA: Diagnosis present

## 2019-12-06 DIAGNOSIS — K219 Gastro-esophageal reflux disease without esophagitis: Secondary | ICD-10-CM | POA: Diagnosis present

## 2019-12-06 DIAGNOSIS — Z79899 Other long term (current) drug therapy: Secondary | ICD-10-CM | POA: Diagnosis not present

## 2019-12-06 DIAGNOSIS — R339 Retention of urine, unspecified: Secondary | ICD-10-CM | POA: Diagnosis present

## 2019-12-06 LAB — BASIC METABOLIC PANEL
Anion gap: 12 (ref 5–15)
Anion gap: 14 (ref 5–15)
Anion gap: 14 (ref 5–15)
Anion gap: 13 (ref 5–15)
Anion gap: 11 (ref 5–15)
Anion gap: 10 (ref 5–15)
BUN: 69 mg/dL — ABNORMAL HIGH (ref 8–23)
BUN: 61 mg/dL — ABNORMAL HIGH (ref 8–23)
BUN: 53 mg/dL — ABNORMAL HIGH (ref 8–23)
BUN: 46 mg/dL — ABNORMAL HIGH (ref 8–23)
BUN: 41 mg/dL — ABNORMAL HIGH (ref 8–23)
BUN: 32 mg/dL — ABNORMAL HIGH (ref 8–23)
CO2: 25 mmol/L (ref 22–32)
CO2: 23 mmol/L (ref 22–32)
CO2: 26 mmol/L (ref 22–32)
CO2: 27 mmol/L (ref 22–32)
CO2: 30 mmol/L (ref 22–32)
CO2: 30 mmol/L (ref 22–32)
Calcium: 9 mg/dL (ref 8.9–10.3)
Calcium: 8.7 mg/dL — ABNORMAL LOW (ref 8.9–10.3)
Calcium: 8.7 mg/dL — ABNORMAL LOW (ref 8.9–10.3)
Calcium: 9 mg/dL (ref 8.9–10.3)
Calcium: 9.1 mg/dL (ref 8.9–10.3)
Calcium: 8.8 mg/dL — ABNORMAL LOW (ref 8.9–10.3)
Chloride: 89 mmol/L — ABNORMAL LOW (ref 98–111)
Chloride: 91 mmol/L — ABNORMAL LOW (ref 98–111)
Chloride: 89 mmol/L — ABNORMAL LOW (ref 98–111)
Chloride: 92 mmol/L — ABNORMAL LOW (ref 98–111)
Chloride: 92 mmol/L — ABNORMAL LOW (ref 98–111)
Chloride: 92 mmol/L — ABNORMAL LOW (ref 98–111)
Creatinine, Ser: 6.75 mg/dL — ABNORMAL HIGH (ref 0.61–1.24)
Creatinine, Ser: 5.4 mg/dL — ABNORMAL HIGH (ref 0.61–1.24)
Creatinine, Ser: 4.56 mg/dL — ABNORMAL HIGH (ref 0.61–1.24)
Creatinine, Ser: 3.37 mg/dL — ABNORMAL HIGH (ref 0.61–1.24)
Creatinine, Ser: 2.77 mg/dL — ABNORMAL HIGH (ref 0.61–1.24)
Creatinine, Ser: 2.05 mg/dL — ABNORMAL HIGH (ref 0.61–1.24)
GFR calc Af Amer: 8 mL/min — ABNORMAL LOW (ref >60)
GFR calc Af Amer: 11 mL/min — ABNORMAL LOW (ref >60)
GFR calc Af Amer: 14 mL/min — ABNORMAL LOW (ref >60)
GFR calc Af Amer: 20 mL/min — ABNORMAL LOW (ref >60)
GFR calc Af Amer: 25 mL/min — ABNORMAL LOW (ref >60)
GFR calc Af Amer: 36 mL/min — ABNORMAL LOW (ref >60)
GFR calc non Af Amer: 7 mL/min — ABNORMAL LOW (ref >60)
GFR calc non Af Amer: 10 mL/min — ABNORMAL LOW (ref >60)
GFR calc non Af Amer: 12 mL/min — ABNORMAL LOW (ref >60)
GFR calc non Af Amer: 17 mL/min — ABNORMAL LOW (ref >60)
GFR calc non Af Amer: 21 mL/min — ABNORMAL LOW (ref >60)
GFR calc non Af Amer: 31 mL/min — ABNORMAL LOW (ref >60)
Glucose, Bld: 100 mg/dL — ABNORMAL HIGH (ref 70–99)
Glucose, Bld: 91 mg/dL (ref 70–99)
Glucose, Bld: 118 mg/dL — ABNORMAL HIGH (ref 70–99)
Glucose, Bld: 110 mg/dL — ABNORMAL HIGH (ref 70–99)
Glucose, Bld: 119 mg/dL — ABNORMAL HIGH (ref 70–99)
Glucose, Bld: 116 mg/dL — ABNORMAL HIGH (ref 70–99)
Potassium: 4.6 mmol/L (ref 3.5–5.1)
Potassium: 4.3 mmol/L (ref 3.5–5.1)
Potassium: 4.2 mmol/L (ref 3.5–5.1)
Potassium: 4.3 mmol/L (ref 3.5–5.1)
Potassium: 4.9 mmol/L (ref 3.5–5.1)
Potassium: 4.1 mmol/L (ref 3.5–5.1)
Sodium: 126 mmol/L — ABNORMAL LOW (ref 135–145)
Sodium: 128 mmol/L — ABNORMAL LOW (ref 135–145)
Sodium: 129 mmol/L — ABNORMAL LOW (ref 135–145)
Sodium: 132 mmol/L — ABNORMAL LOW (ref 135–145)
Sodium: 133 mmol/L — ABNORMAL LOW (ref 135–145)
Sodium: 132 mmol/L — ABNORMAL LOW (ref 135–145)

## 2019-12-06 LAB — CBC
HCT: 32.2 % — ABNORMAL LOW (ref 39.0–52.0)
Hemoglobin: 11.3 g/dL — ABNORMAL LOW (ref 13.0–17.0)
MCH: 29.5 pg (ref 26.0–34.0)
MCHC: 35.1 g/dL (ref 30.0–36.0)
MCV: 84.1 fL (ref 80.0–100.0)
Platelets: 274 10*3/uL (ref 150–400)
RBC: 3.83 MIL/uL — ABNORMAL LOW (ref 4.22–5.81)
RDW: 12.8 % (ref 11.5–15.5)
WBC: 6.7 10*3/uL (ref 4.0–10.5)
nRBC: 0 % (ref 0.0–0.2)

## 2019-12-06 MED ORDER — CHLORHEXIDINE GLUCONATE CLOTH 2 % EX PADS
6.0000 | MEDICATED_PAD | Freq: Every day | CUTANEOUS | Status: DC
  Administered 2019-12-06 – 2019-12-08 (×3): 6 via TOPICAL

## 2019-12-06 MED ORDER — SODIUM CHLORIDE 0.9 % IV SOLN
2.0000 g | INTRAVENOUS | Status: DC
  Administered 2019-12-06 – 2019-12-08 (×3): 2 g via INTRAVENOUS
  Filled 2019-12-06 (×3): qty 20

## 2019-12-06 NOTE — Progress Notes (Addendum)
Family Medicine Teaching Service Daily Progress Note Intern Pager: [phone]  Patient name: LUNDEN MCLEISH Medical record number: [id number] Date of birth: 01/22/1946 Age: 74 y.o. Gender: male  Primary Care Provider: Aletha Halim., PA-C Consultants: Nephrology  Code Status: Full   Pt Overview and Major Events to Date:  Admitted to [facility] 6/12  Assessment and Plan: CYPRIAN GONGAWARE a 74 y.o.malepresenting with acute renal failure secondary to bladder outlet obstruction.PMH is significant for laryngeal cancer, GERD, HLD.  Acute renal failure 2/2 post renal obstruction Patient with renal obstruction following recent colonoscopy on 6/3. Cr improved 6.75>5.40. CT renal scan consistent with bladder outlet obstruction.  Nephrology consulted, recommended catheter placement. Total output 6.375L on 6/12.  -nephrology consulted, appreciate recommendations  -flomax 0.4mg   -BPH w/u as outpt -strict Is&Os -avoid nephrotoxic meds and contrast -BMP q4h  Hyperkalemia Improving. K trended at 6.5>5.5>4.6>4.3. S/p lokelma, lasix, bicarb.  -nephrology consulted, appreciate recommendations  -BMP q4h  Hyponatremia Improving. Na trended at 118>122>126>128.  -nephrology consulted, appreciate recommendations  -BMP q4h -dc sodium bicarb, paged nephrology to obtain guidance for Na correction   Uremia Improving. BUN trended at 84>75>69>61.  -trend on BMP  Weakness Likely 2/2 electrolyte imbalance and poor PO intake.   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  Currently in remission at this time  GERD Home medications include Protonix 40 mg daily -Continue home medications  Hyperlipidemia Patient is on atorvastatin 10 mg daily. -Continue home atorvastatin  Chronic pain, low back He reports taking Norco 10 regularly for his low back pain in addition to the Celebrex.  He  denies other NSAID use.  PDMP reviewed. -Hold Celebrex due to acute renal failure -Holding hydrocodone due to extended half-life in the setting of acute renal failure -Tylenol and K pad as needed for back pain -Consider gabapentin for worsened back pain  Nicotine use disorder -Nicotine patch if desired  FEN/GI: Regular diet Prophylaxis: Heparin  Disposition: pending clinical improvement and improvement in electrolytes   Subjective:  Patient states he overall feels improves. States he no longer has any pelvic pressure or pain. States his weakness and fatigue is resolves as well. Reports he had no urinary issues until after colonoscopy. Since then was unable to urinate on his own. He is hopeful that he will be able to remove catheter and urinate on his own soon.   Objective: Temp:  [97.8 F (36.6 C)-99 F (37.2 C)] 98.3 F (36.8 C) (06/13 0537) Pulse Rate:  [58-107] 107 (06/13 0537) Resp:  [13-20] 18 (06/13 0537) BP: (101-137)/(47-95) 123/75 (06/13 0537) SpO2:  [90 %-100 %] 93 % (06/13 0537) Weight:  [79.4 kg-81 kg] 81 kg (06/12 2117) Physical Exam: General: awake and alert, NAD  Cardiovascular: RRR, no MRG  Respiratory: CTAB, no wheezes, rales, or rhonchi  Abdomen: soft, non tender, non distended, bowel sounds present  Extremities: no edema  Laboratory: Recent Labs  Lab 12/05/19 1036 12/05/19 1708 12/06/19 0408  WBC 9.9 9.5 6.7  HGB 13.2 11.9* 11.3*  HCT 38.3* 34.7* 32.2*  PLT 318 270 274   Recent Labs  Lab 12/05/19 1036 12/05/19 1708 12/05/19 2022 12/06/19 0048 12/06/19 0408  NA 118*   < > 122* 126* 128*  K 5.7*   < > 5.5* 4.6 4.3  CL 82*   < > 86* 89* 91*  CO2 18*   < > 22 25 23   BUN 84*   < >  75* 69* 61*  CREATININE 10.49*   < > 8.39* 6.75* 5.40*  CALCIUM 8.9   < > 8.9 9.0 8.7*  PROT 7.5  --   --   --   --   BILITOT 0.4  --   --   --   --   ALKPHOS 81  --   --   --   --   ALT 21  --   --   --   --   AST 21  --   --   --   --   GLUCOSE 90   < > 86  100* 91   < > = values in this interval not displayed.     Imaging/Diagnostic Tests: No new imaging   Caroline More, DO 12/06/2019, 6:21 AM PGY-3, [place] Intern pager: [phone], text pages welcome

## 2019-12-06 NOTE — Evaluation (Signed)
Physical Therapy Evaluation Patient Details Name: Aaron Klein MRN: [id number] DOB: [birth year] Today's Date: 12/06/2019   History of Present Illness  LIONELL MATUSZAK is a 74 y.o. male presenting with acute renal failure secondary to bladder outlet obstruction. PMH is significant for laryngeal cancer, GERD, HLD.  Clinical Impression   Pt admitted with above diagnosis. Comes from home where he lives alone in a single story house; lives with his girlfriend a portion of the week; Independent, and walking without an assistive device; Presents to PT with gait instability, generalized weakness;  Pt currently with functional limitations due to the deficits listed below (see PT Problem List). Pt will benefit from skilled PT to increase their independence and safety with mobility to allow discharge to the venue listed below.       Follow Up Recommendations Home health PT    Equipment Recommendations  Rolling walker with 5" wheels (may already have)    Recommendations for Other Services       Precautions / Restrictions Precautions Precautions: Fall      Mobility  Bed Mobility Overal bed mobility: Modified Independent                Transfers Overall transfer level: Needs assistance Equipment used: None Transfers: Sit to/from Stand Sit to Stand: Min guard         General transfer comment: Minguard for safety; noted need to steady self with UE support on bedrail at initial stand  Ambulation/Gait Ambulation/Gait assistance: Min assist;Min guard Gait Distance (Feet): 150 Feet Assistive device: None;Rolling walker (2 wheeled) Gait Pattern/deviations: Step-through pattern;Decreased step length - right;Decreased step length - left;Decreased stride length Gait velocity: slowed   General Gait Details: Unsteady gait, with erratic step width, indicative of incr fall risk; Better stability with RW  Stairs            Wheelchair Mobility    Modified Rankin (Stroke Patients  Only)       Balance                                             Pertinent Vitals/Pain Pain Assessment: No/denies pain    Home Living Family/patient expects to be discharged to:: Private residence Living Arrangements: Alone Available Help at Discharge: Friend(s);Available PRN/intermittently Type of Home: House Home Access: Stairs to enter Entrance Stairs-Rails: None Entrance Stairs-Number of Steps: 2 Home Layout: One level Home Equipment: Environmental consultant - 2 wheels (need to confirm)      Prior Function Level of Independence: Independent               Hand Dominance        Extremity/Trunk Assessment   Upper Extremity Assessment Upper Extremity Assessment: Overall WFL for tasks assessed    Lower Extremity Assessment Lower Extremity Assessment: Generalized weakness       Communication   Communication: No difficulties  Cognition Arousal/Alertness: Awake/alert Behavior During Therapy: WFL for tasks assessed/performed Overall Cognitive Status: Within Functional Limits for tasks assessed                                        General Comments      Exercises     Assessment/Plan    PT Assessment Patient needs continued PT services  PT Problem List Decreased strength;Decreased  activity tolerance;Decreased balance;Decreased mobility;Decreased coordination;Decreased knowledge of use of DME;Decreased safety awareness       PT Treatment Interventions DME instruction;Gait training;Stair training;Functional mobility training;Therapeutic activities;Therapeutic exercise;Balance training;Neuromuscular re-education;Patient/family education    PT Goals (Current goals can be found in the Care Plan section)  Acute Rehab PT Goals Patient Stated Goal: get home soon PT Goal Formulation: With patient Time For Goal Achievement: 12/20/19 Potential to Achieve Goals: Good    Frequency Min 3X/week   Barriers to discharge         Co-evaluation               AM-PAC PT "6 Clicks" Mobility  Outcome Measure Help needed turning from your back to your side while in a flat bed without using bedrails?: None Help needed moving from lying on your back to sitting on the side of a flat bed without using bedrails?: None Help needed moving to and from a bed to a chair (including a wheelchair)?: None Help needed standing up from a chair using your arms (e.g., wheelchair or bedside chair)?: A Little Help needed to walk in hospital room?: A Little Help needed climbing 3-5 steps with a railing? : A Little 6 Click Score: 21    End of Session Equipment Utilized During Treatment: Gait belt Activity Tolerance: Patient tolerated treatment well Patient left: in chair;with call bell/phone within reach;with family/visitor present Nurse Communication: Mobility status PT Visit Diagnosis: Unsteadiness on feet (R26.81);Other abnormalities of gait and mobility (R26.89)    Time: 7341-9379 PT Time Calculation (min) (ACUTE ONLY): 19 min   Charges:   PT Evaluation $PT Eval Low Complexity: [place], PT  Acute Rehabilitation Services Pager [phone] Office [phone]   Colletta [place] 12/06/2019, 2:43 PM

## 2019-12-06 NOTE — Progress Notes (Signed)
PROGRESS NOTE  Patient febrile above 101*F.  Dr. Prevent ordering chest x-ray, blood cultures, urine culture.  Spoke with nurse Gwenlyn Perking, instructed him that per the infectious disease Society guidelines that the patient's Foley catheter needs to be completely removed, have a new one inserted, and clean-catch urine collected for urine culture from the new Foley.  Order will be placed for current Foley to be removed and new Foley to be inserted.  Patient arrived with post obstructive AKI w/ Cr 10 (Baseline Cr ~1.3?). Would like to start Abx for empiric coverage. Would like to start ceftriaxone, called pharmacy regarding need for Abx and severe AKI.  They agreed with ceftriaxone.  Order for ceftriaxone placed.  Page [phone] for further questions.   Milus Banister, [place], PGY-2 12/06/2019 9:22 AM

## 2019-12-06 NOTE — Progress Notes (Addendum)
[facility] NEPHROLOGY PROGRESS NOTE  Assessment/ Plan: Pt is a 74 y.o. yo male  with history of acid reflux, HLD, who was presented with generalized abdominal pain after colonoscopy done on 6/3, seen as a consultation  for acute kidney injury (cr 10.49) and hyponatremia (Na 118).  #Acute kidney injury due to obstructive uropathy/bladder outlet obstruction: Foley catheter was inserted with around 7 L of urine output so far.  Marked improvement of creatinine level down to 4.56 today.  Clinically much improved. He does not have history of BPH or bladder issues however he will need to follow-up with urologist.  He currently has Foley catheter.  Agree with Flomax. UA with some RBCs probably traumatic after insertion of Foley catheter.  No proteinuria. Watch for postobstructive diuresis.  We will decide about IV fluid depending on repeat lab results and urine output in next few hours.  Euvolemic at the moment.  He can take orally. Avoid nephrotoxins including NSAIDs, IV contrast.  #Hyponatremia, subacute and asymptomatic: Due to obstructive uropathy causing reduced free water excretion.  Serum sodium level 129 this morning.  Discontinued sodium bicarbonate IV fluid.  Monitor lab frequently.  #Hyperkalemia due to AKI: Improved after medical management.  #Metabolic acidosis, anion gap due to renal failure: Improved.  Discontinued sodium bicarbonate.  Discussed with the primary team.  Subjective: Seen and examined at bedside.  He feels much better.  Denies nausea vomiting chest pain shortness of breath.  Urine output around 450 cc since this morning. Objective Vital signs in last 24 hours: Vitals:   12/06/19 0213 12/06/19 0537 12/06/19 0639 12/06/19 0908  BP:  123/75 125/74 104/64  Pulse: 83 (!) 107 69 97  Resp:  18 20 18   Temp:  98.3 F (36.8 C) 98.1 F (36.7 C) (!) 101.2 F (38.4 C)  TempSrc:  Oral Oral Oral  SpO2: 93% 93% 93% 96%  Weight:      Height:       Weight  change:   Intake/Output Summary (Last 24 hours) at 12/06/2019 1043 Last data filed at 12/06/2019 0900 Gross per 24 hour  Intake 1530.91 ml  Output 7525 ml  Net -5994.09 ml       Labs: Basic Metabolic Panel: Recent Labs  Lab 12/06/19 0048 12/06/19 0408 12/06/19 0627  NA 126* 128* 129*  K 4.6 4.3 4.2  CL 89* 91* 89*  CO2 25 23 26   GLUCOSE 100* 91 118*  BUN 69* 61* 53*  CREATININE 6.75* 5.40* 4.56*  CALCIUM 9.0 8.7* 8.7*   Liver Function Tests: Recent Labs  Lab 12/05/19 1036  AST 21  ALT 21  ALKPHOS 81  BILITOT 0.4  PROT 7.5  ALBUMIN 3.1*   Recent Labs  Lab 12/05/19 1036  LIPASE 28   No results for input(s): AMMONIA in the last 168 hours. CBC: Recent Labs  Lab 12/05/19 1036 12/05/19 1708 12/06/19 0408  WBC 9.9 9.5 6.7  HGB 13.2 11.9* 11.3*  HCT 38.3* 34.7* 32.2*  MCV 86.3 86.8 84.1  PLT 318 270 274   Cardiac Enzymes: No results for input(s): CKTOTAL, CKMB, CKMBINDEX, TROPONINI in the last 168 hours. CBG: No results for input(s): GLUCAP in the last 168 hours.  Iron Studies: No results for input(s): IRON, TIBC, TRANSFERRIN, FERRITIN in the last 72 hours. Studies/Results: DG Chest 2 View  Result Date: 12/06/2019 CLINICAL DATA:  Fever EXAM: CHEST - 2 VIEW COMPARISON:  None. FINDINGS: There is scarring in the lateral left base. There is no edema or airspace  opacity. Heart size and pulmonary vascularity are normal. There is a focal hiatal hernia. There is thoracolumbar levoscoliosis with increased kyphosis. There is anterior wedging of several lower thoracic vertebral bodies. IMPRESSION: Scarring left base. No edema or airspace opacity. Heart size normal. Hiatal hernia present. Scoliosis and kyphosis near the thoracolumbar junction. Electronically Signed   By: Lowella Grip III M.D.   On: 12/06/2019 10:11   CT Renal Stone Study  Result Date: 12/05/2019 CLINICAL DATA:  Diffuse severe abdominal pain since colonoscopy 9 days ago. Acute renal failure. EXAM:  CT ABDOMEN AND PELVIS WITHOUT CONTRAST TECHNIQUE: Multidetector CT imaging of the abdomen and pelvis was performed following the standard protocol without IV contrast. COMPARISON:  None. FINDINGS: Lower chest: Trace left greater than right pleural effusions with adjacent lower lobe subsegmental atelectasis. 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 Hepatobiliary: No focal liver abnormality. Small calcified granuloma right inferior liver. Small gallstones. No gallbladder wall thickening or biliary dilatation. Pancreas: Unremarkable. No pancreatic ductal dilatation or surrounding inflammatory changes. Spleen: Normal in size without focal abnormality. Adrenals/Urinary Tract: The adrenal glands are unremarkable. Mild symmetric bilateral hydroureteronephrosis. Prominent bilateral retroperitoneal simple fluid, extending to the inguinal ligament on the right. No retroperitoneal hematoma. Markedly distended, thin walled bladder. Stomach/Bowel: Stomach is within normal limits. Appendix appears normal (series 6, image 18). No evidence of bowel wall thickening, distention, or inflammatory changes. Moderate left-sided colonic diverticulosis. Vascular/Lymphatic: Aortic atherosclerosis. No enlarged abdominal or pelvic lymph nodes. Reproductive: Mild prostatomegaly. Other: Trace free fluid in the pelvis.  No pneumoperitoneum. Musculoskeletal: No acute or significant osseous findings. Severe levoscoliosis of the lumbar spine. IMPRESSION: 1. Markedly distended bladder with mild symmetric bilateral hydroureteronephrosis, suggestive of bladder outlet obstruction. Prominent bilateral retroperitoneal simple fluid may reflect urine leakage. No retroperitoneal hematoma. 2. No pneumoperitoneum or evidence of bowel perforation. 3. Trace left greater than right pleural effusions. 4. 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 5. Cholelithiasis. 6. Aortic Atherosclerosis (ICD10-I70.0).  Electronically Signed   By: Titus Dubin M.D.   On: 12/05/2019 14:30    Medications: Infusions: . cefTRIAXone (ROCEPHIN)  IV      Scheduled Medications: . atorvastatin  10 mg Oral Daily  . Chlorhexidine Gluconate Cloth  6 each Topical Daily  . gabapentin  100 mg Oral TID  . heparin  5,000 Units Subcutaneous Q8H  . pantoprazole  40 mg Oral Daily  . tamsulosin  0.4 mg Oral Daily    have reviewed scheduled and prn medications.  Physical Exam: General:NAD, comfortable Heart:RRR, s1s2 nl, no rubs Lungs:clear b/l, no crackle Abdomen:soft, Non-tender, non-distended Extremities:No LE edema Neurology: Alert awake, no asterixis  Ardythe Klute Tanna Furry 12/06/2019,10:43 AM  LOS: 0 days  Pager: [phone]

## 2019-12-06 NOTE — Plan of Care (Signed)
  Problem: Education: Goal: Knowledge of General Education information will improve Description Including pain rating scale, medication(s)/side effects and non-pharmacologic comfort measures Outcome: Progressing   

## 2019-12-06 NOTE — Progress Notes (Signed)
Discussed with Dr. Carolin Sicks. Dr. Carolin Sicks agreed with stopping sodium bicarb drip. States that patient can now auto-diurese. States that he may have a post obstructive diuresis now. Dr. Carolin Sicks will see patient and give further recommendations.   Dalphine Handing, PGY-3 [facility] 12/06/2019 7:10 AM

## 2019-12-07 LAB — BASIC METABOLIC PANEL
Anion gap: 11 (ref 5–15)
Anion gap: 9 (ref 5–15)
BUN: 23 mg/dL (ref 8–23)
BUN: 27 mg/dL — ABNORMAL HIGH (ref 8–23)
CO2: 28 mmol/L (ref 22–32)
CO2: 30 mmol/L (ref 22–32)
Calcium: 9 mg/dL (ref 8.9–10.3)
Calcium: 8.8 mg/dL — ABNORMAL LOW (ref 8.9–10.3)
Chloride: 95 mmol/L — ABNORMAL LOW (ref 98–111)
Chloride: 93 mmol/L — ABNORMAL LOW (ref 98–111)
Creatinine, Ser: 1.48 mg/dL — ABNORMAL HIGH (ref 0.61–1.24)
Creatinine, Ser: 1.67 mg/dL — ABNORMAL HIGH (ref 0.61–1.24)
GFR calc Af Amer: 53 mL/min — ABNORMAL LOW (ref >60)
GFR calc Af Amer: 46 mL/min — ABNORMAL LOW (ref >60)
GFR calc non Af Amer: 46 mL/min — ABNORMAL LOW (ref >60)
GFR calc non Af Amer: 39 mL/min — ABNORMAL LOW (ref >60)
Glucose, Bld: 119 mg/dL — ABNORMAL HIGH (ref 70–99)
Glucose, Bld: 105 mg/dL — ABNORMAL HIGH (ref 70–99)
Potassium: 4.2 mmol/L (ref 3.5–5.1)
Potassium: 4.4 mmol/L (ref 3.5–5.1)
Sodium: 134 mmol/L — ABNORMAL LOW (ref 135–145)
Sodium: 132 mmol/L — ABNORMAL LOW (ref 135–145)

## 2019-12-07 LAB — URINE CULTURE: Culture: NO GROWTH

## 2019-12-07 LAB — BASIC METABOLIC PANEL WITH GFR
Anion gap: 8 (ref 5–15)
BUN: 21 mg/dL (ref 8–23)
CO2: 29 mmol/L (ref 22–32)
Calcium: 9 mg/dL (ref 8.9–10.3)
Chloride: 95 mmol/L — ABNORMAL LOW (ref 98–111)
Creatinine, Ser: 1.29 mg/dL — ABNORMAL HIGH (ref 0.61–1.24)
GFR calc Af Amer: >60 mL/min
GFR calc non Af Amer: 54 mL/min — ABNORMAL LOW
Glucose, Bld: 111 mg/dL — ABNORMAL HIGH (ref 70–99)
Potassium: 4.1 mmol/L (ref 3.5–5.1)
Sodium: 132 mmol/L — ABNORMAL LOW (ref 135–145)

## 2019-12-07 LAB — CBC
HCT: 35.6 % — ABNORMAL LOW (ref 39.0–52.0)
Hemoglobin: 12.2 g/dL — ABNORMAL LOW (ref 13.0–17.0)
MCH: 29.6 pg (ref 26.0–34.0)
MCHC: 34.3 g/dL (ref 30.0–36.0)
MCV: 86.4 fL (ref 80.0–100.0)
Platelets: 333 10*3/uL (ref 150–400)
RBC: 4.12 MIL/uL — ABNORMAL LOW (ref 4.22–5.81)
RDW: 13 % (ref 11.5–15.5)
WBC: 6.6 10*3/uL (ref 4.0–10.5)
nRBC: 0 % (ref 0.0–0.2)

## 2019-12-07 NOTE — Progress Notes (Signed)
Family Medicine Teaching Service Daily Progress Note Intern Pager: [phone]  Patient name: Aaron Klein Medical record number: [id number] Date of birth: 1945-11-02 Age: 74 y.o. Gender: male  Primary Care Provider: Aletha Halim., PA-C Consultants: Nephrology  Code Status: Full   Pt Overview and Major Events to Date:  Admitted to [facility] 6/12  Assessment and Plan: Aaron Klein a 74 y.o.malepresenting with acute renal failure secondary to bladder outlet obstruction.PMH is significant for laryngeal cancer, GERD, HLD.  Acute renal failure 2/2 post renal obstruction Patient with renal obstruction following recent colonoscopy on 6/3. Cr improved 6.75>5.40>>1.48. CT renal scan consistent with bladder outlet obstruction.  Nephrology consulted, recommended catheter placement. Total output 6.375L on 6/12, patient down 7,559ml since admission.  Patient did endorse a fever of 101.2 F last night around 9 PM. -nephrology consulted, appreciate recommendations  -flomax 0.4mg   -BPH w/u as outpt -strict Is&Os -avoid nephrotoxic meds and contrast -BMP q4h -Follow-up on blood and urine cultures due to patient having fever last night.  Atrial fibrillation Patient's cardiac monitoring showed what looked like atrial fibrillation.  This is new for the patient is significant he has never had a diagnosis of this before per chart review. -EKG  Hyperkalemia, resolved K trended at 6.5>5.5>4.6>4.3>4.2. S/p lokelma, lasix, bicarb.  -nephrology consulted, appreciate recommendations  -BMP q4h  Hyponatremia, improving Improving. Na trended at 118>122>126>128>134.  -nephrology consulted, appreciate recommendations  -BMP q4h  Uremia, improving Improving. BUN trended at 84>75>69>61>>23.  -trend on BMP  Weakness Likely 2/2 electrolyte imbalance and poor PO intake.   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  Currently in remission at this time  GERD Home medications include Protonix 40 mg daily -Continue home medications  Hyperlipidemia Patient is on atorvastatin 10 mg daily. -Continue home atorvastatin  Chronic pain, low back He reports taking Norco 10 regularly for his low back pain in addition to the Celebrex.  He denies other NSAID use.  PDMP reviewed. -Hold Celebrex due to acute renal failure -Holding hydrocodone due to extended half-life in the setting of acute renal failure -Tylenol and K pad as needed for back pain -Consider gabapentin for worsened back pain  Nicotine use disorder -Nicotine patch if desired  FEN/GI: Regular diet Prophylaxis: Heparin  Disposition: pending clinical improvement and improvement in electrolytes   Subjective:  Patient denies any suprapubic discomfort or other pains at this time.  States he has not felt any other fevers other than the previous one.  Objective: Temp:  [97.7 F (36.5 C)-101.2 F (38.4 C)] 98.7 F (37.1 C) (06/14 0552) Pulse Rate:  [66-97] 74 (06/14 0552) Resp:  [16-20] 18 (06/14 0552) BP: (103-144)/(60-83) 144/83 (06/14 0552) SpO2:  [92 %-96 %] 94 % (06/14 0552) Weight:  [77.1 kg] 77.1 kg (06/13 2147)   Physical Exam: General: Alert and oriented in no apparent distress Heart: Regular rate and rhythm with no murmurs appreciated Lungs: CTA bilaterally, no wheezing Abdomen: Bowel sounds present, no abdominal pain, no suprapubic discomfort to palpation  Laboratory: Recent Labs  Lab 12/05/19 1708 12/06/19 0408 12/07/19 0457  WBC 9.5 6.7 6.6  HGB 11.9* 11.3* 12.2*  HCT 34.7* 32.2* 35.6*  PLT 270 274 333   Recent Labs  Lab 12/05/19 1036 12/05/19 1708 12/06/19 1501 12/06/19 2039 12/07/19 0457  NA 118*   < > 133* 132* 134*  K 5.7*   < > 4.9 4.1 4.2  CL 82*   < >  92* 92* 95*  CO2 18*   < > 30 30 28   BUN 84*   < > 41* 32* 23  CREATININE 10.49*   < > 2.77* 2.05* 1.48*  CALCIUM 8.9    < > 9.1 8.8* 9.0  PROT 7.5  --   --   --   --   BILITOT 0.4  --   --   --   --   ALKPHOS 81  --   --   --   --   ALT 21  --   --   --   --   AST 21  --   --   --   --   GLUCOSE 90   < > 119* 116* 119*   < > = values in this interval not displayed.     Imaging/Diagnostic Tests: No new imaging   Lurline Del, DO 12/07/2019, 6:14 AM PGY-3, [place] Intern pager: [phone], text pages welcome

## 2019-12-07 NOTE — Plan of Care (Signed)
  Problem: Activity: Goal: Risk for activity intolerance will decrease Outcome: Progressing   

## 2019-12-07 NOTE — Progress Notes (Signed)
[facility] ROUNDING NOTE   Subjective:   This is a 74 year old gentleman with a history of reflux hyperlipidemia underwent colonoscopy 11/26/2019 and developed generalized abdominal pain afterwards.  He was seen in consultation due to acute kidney injury with a creatinine increased to 10.49 mg deciliter and hyponatremia with a sodium of 118.  He was found to have bladder outlet obstruction Foley catheter was inserted and 7 L of urine was received.  His creatinine has improved down to 4.56.  Urinalysis did have some red blood cells secondary to Foley trauma.  No proteinuria.  Urine output 12/06/2019 5.3 L  Blood pressure 144/83 pulse 74 temperature 98.7 O2 sats 94% room air  Sodium 132 potassium 4.1 chloride 95 CO2 929 BUN 21 creatinine 1.29 glucose 111 calcium 9 hemoglobin 12.2  Lipitor 10 mg daily gabapentin 100 mg 3 times daily Protonix 40 mg daily, Flomax 0.4 mg daily  Rocephin 2 g every 24 hours  Objective:  Vital signs in last 24 hours:  Temp:  [97.7 F (36.5 C)-98.7 F (37.1 C)] 98.7 F (37.1 C) (06/14 0552) Pulse Rate:  [66-87] 74 (06/14 0552) Resp:  [16-18] 18 (06/14 0552) BP: (103-144)/(60-83) 144/83 (06/14 0552) SpO2:  [92 %-94 %] 94 % (06/14 0552) Weight:  [77.1 kg] 77.1 kg (06/13 2147)  Weight change: -2.28 kg Filed Weights   12/05/19 1146 12/05/19 2117 12/06/19 2147  Weight: 79.4 kg 81 kg 77.1 kg    Intake/Output: I/O last 3 completed shifts: In: 2550.9 [P.O.:1680; I.V.:870.9] Out: 3557 [Urine:7975]   Intake/Output this shift:  No intake/output data recorded.  General:NAD, comfortable Heart:RRR, s1s2 nl, no rubs Lungs:clear b/l, no crackle Abdomen:soft, Non-tender, non-distended Extremities:No LE edema Neurology: Alert awake, no asterixis   Basic Metabolic Panel: Recent Labs  Lab 12/06/19 1501 12/06/19 1501 12/06/19 2039 12/06/19 2039 12/07/19 0022 12/07/19 0457 12/07/19 0812  NA 133*  --  132*  --  132* 134* 132*  K 4.9  --   4.1  --  4.4 4.2 4.1  CL 92*  --  92*  --  93* 95* 95*  CO2 30  --  30  --  30 28 29   GLUCOSE 119*  --  116*  --  105* 119* 111*  BUN 41*  --  32*  --  27* 23 21  CREATININE 2.77*  --  2.05*  --  1.67* 1.48* 1.29*  CALCIUM 9.1   < > 8.8*   < > 8.8* 9.0 9.0   < > = values in this interval not displayed.    Liver Function Tests: Recent Labs  Lab 12/05/19 1036  AST 21  ALT 21  ALKPHOS 81  BILITOT 0.4  PROT 7.5  ALBUMIN 3.1*   Recent Labs  Lab 12/05/19 1036  LIPASE 28   No results for input(s): AMMONIA in the last 168 hours.  CBC: Recent Labs  Lab 12/05/19 1036 12/05/19 1708 12/06/19 0408 12/07/19 0457  WBC 9.9 9.5 6.7 6.6  HGB 13.2 11.9* 11.3* 12.2*  HCT 38.3* 34.7* 32.2* 35.6*  MCV 86.3 86.8 84.1 86.4  PLT 318 270 274 333    Cardiac Enzymes: No results for input(s): CKTOTAL, CKMB, CKMBINDEX, TROPONINI in the last 168 hours.  BNP: Invalid input(s): POCBNP  CBG: No results for input(s): GLUCAP in the last 168 hours.  Microbiology: Results for orders placed or performed during the hospital encounter of 12/05/19  SARS Coronavirus 2 by RT PCR (hospital order, performed in [facility] hospital lab) Nasopharyngeal Nasopharyngeal Swab  Status: None   Collection Time: 12/05/19  4:11 PM   Specimen: Nasopharyngeal Swab  Result Value Ref Range Status   SARS Coronavirus 2 NEGATIVE NEGATIVE Final    Comment: (NOTE) SARS-CoV-2 target nucleic acids are NOT DETECTED.  The SARS-CoV-2 RNA is generally detectable in upper and lower respiratory specimens during the acute phase of infection. The lowest concentration of SARS-CoV-2 viral copies this assay can detect is 250 copies / mL. A negative result does not preclude SARS-CoV-2 infection and should not be used as the sole basis for treatment or other patient management decisions.  A negative result may occur with improper specimen collection / handling, submission of specimen other than nasopharyngeal swab, presence  of viral mutation(s) within the areas targeted by this assay, and inadequate number of viral copies (<250 copies / mL). A negative result must be combined with clinical observations, patient history, and epidemiological information.  Fact Sheet for Patients:   StrictlyIdeas.no  Fact Sheet for Healthcare Providers: BankingDealers.co.za  This test is not yet approved or  cleared by the [place] FDA and has been authorized for detection and/or diagnosis of SARS-CoV-2 by FDA under an Emergency Use Authorization (EUA).  This EUA will remain in effect (meaning this test can be used) for the duration of the COVID-19 declaration under Section 564(b)(1) of the Act, 21 U.S.C. section 360bbb-3(b)(1), unless the authorization is terminated or revoked sooner.  Performed at [facility], [place] [address], [place], [place] [address]   Culture, blood (routine x 2)     Status: None (Preliminary result)   Collection Time: 12/06/19 10:55 AM   Specimen: BLOOD  Result Value Ref Range Status   Specimen Description BLOOD LEFT ANTECUBITAL  Final   Special Requests   Final    BOTTLES DRAWN AEROBIC AND ANAEROBIC Blood Culture results may not be optimal due to an inadequate volume of blood received in culture bottles   Culture   Final    NO GROWTH < 12 HOURS Performed at [facility], [place] [address], [place], [place] [address]    Report Status PENDING  Incomplete  Culture, blood (routine x 2)     Status: None (Preliminary result)   Collection Time: 12/06/19 10:55 AM   Specimen: BLOOD LEFT HAND  Result Value Ref Range Status   Specimen Description BLOOD LEFT HAND  Final   Special Requests   Final    BOTTLES DRAWN AEROBIC AND ANAEROBIC Blood Culture adequate volume   Culture   Final    NO GROWTH < 12 HOURS Performed at [facility], [place] [address] [place], [place] [address]    Report Status PENDING  Incomplete  Culture, Urine      Status: None   Collection Time: 12/06/19 11:16 AM   Specimen: Urine, Random  Result Value Ref Range Status   Specimen Description URINE, RANDOM  Final   Special Requests NONE  Final   Culture   Final    NO GROWTH Performed at [facility], [place] [address] [place], [place] [address]    Report Status 12/07/2019 FINAL  Final    Coagulation Studies: No results for input(s): LABPROT, INR in the last 72 hours.  Urinalysis: Recent Labs    12/05/19 1627  COLORURINE YELLOW  LABSPEC 1.010  PHURINE 5.0  GLUCOSEU NEGATIVE  HGBUR SMALL*  BILIRUBINUR NEGATIVE  KETONESUR NEGATIVE  PROTEINUR NEGATIVE  NITRITE NEGATIVE  LEUKOCYTESUR TRACE*      Imaging: DG Chest 2  View  Result Date: 12/06/2019 CLINICAL DATA:  Fever EXAM: CHEST - 2 VIEW COMPARISON:  None. FINDINGS: There is scarring in the lateral left base. There is no edema or airspace opacity. Heart size and pulmonary vascularity are normal. There is a focal hiatal hernia. There is thoracolumbar levoscoliosis with increased kyphosis. There is anterior wedging of several lower thoracic vertebral bodies. IMPRESSION: Scarring left base. No edema or airspace opacity. Heart size normal. Hiatal hernia present. Scoliosis and kyphosis near the thoracolumbar junction. Electronically Signed   By: Lowella Grip III M.D.   On: 12/06/2019 10:11   CT Renal Stone Study  Result Date: 12/05/2019 CLINICAL DATA:  Diffuse severe abdominal pain since colonoscopy 9 days ago. Acute renal failure. EXAM: CT ABDOMEN AND PELVIS WITHOUT CONTRAST TECHNIQUE: Multidetector CT imaging of the abdomen and pelvis was performed following the standard protocol without IV contrast. COMPARISON:  None. FINDINGS: Lower chest: Trace left greater than right pleural effusions with adjacent lower lobe subsegmental atelectasis. 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 Hepatobiliary: No focal liver abnormality. Small calcified granuloma right inferior  liver. Small gallstones. No gallbladder wall thickening or biliary dilatation. Pancreas: Unremarkable. No pancreatic ductal dilatation or surrounding inflammatory changes. Spleen: Normal in size without focal abnormality. Adrenals/Urinary Tract: The adrenal glands are unremarkable. Mild symmetric bilateral hydroureteronephrosis. Prominent bilateral retroperitoneal simple fluid, extending to the inguinal ligament on the right. No retroperitoneal hematoma. Markedly distended, thin walled bladder. Stomach/Bowel: Stomach is within normal limits. Appendix appears normal (series 6, image 18). No evidence of bowel wall thickening, distention, or inflammatory changes. Moderate left-sided colonic diverticulosis. Vascular/Lymphatic: Aortic atherosclerosis. No enlarged abdominal or pelvic lymph nodes. Reproductive: Mild prostatomegaly. Other: Trace free fluid in the pelvis.  No pneumoperitoneum. Musculoskeletal: No acute or significant osseous findings. Severe levoscoliosis of the lumbar spine. IMPRESSION: 1. Markedly distended bladder with mild symmetric bilateral hydroureteronephrosis, suggestive of bladder outlet obstruction. Prominent bilateral retroperitoneal simple fluid may reflect urine leakage. No retroperitoneal hematoma. 2. No pneumoperitoneum or evidence of bowel perforation. 3. Trace left greater than right pleural effusions. 4. 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 5. Cholelithiasis. 6. Aortic Atherosclerosis (ICD10-I70.0). Electronically Signed   By: Titus Dubin M.D.   On: 12/05/2019 14:30     Medications:   . cefTRIAXone (ROCEPHIN)  IV 2 g (12/06/19 1217)   . atorvastatin  10 mg Oral Daily  . Chlorhexidine Gluconate Cloth  6 each Topical Daily  . gabapentin  100 mg Oral TID  . heparin  5,000 Units Subcutaneous Q8H  . pantoprazole  40 mg Oral Daily  . tamsulosin  0.4 mg Oral Daily   acetaminophen, ondansetron  Assessment/ Plan:  #Acute kidney injury due to  obstructive uropathy/bladder outlet obstruction: Improved good urine output watch for postobstructive diuresis.  IV fluids have been discontinued.  He is taking orally.  Will sign off on patient.  Will need bladder training and urology follow-up  #Hyponatremia, resolved  #Hyperkalemia due to AKI:  Resolved  #Metabolic acidosis, resolved   Appears to have recovered will sign off on patient.   LOS: [place] @TODAY @9 :32 AM

## 2019-12-07 NOTE — Plan of Care (Signed)
  Problem: Clinical Measurements: Goal: Ability to maintain clinical measurements within normal limits will improve Outcome: Progressing   

## 2019-12-07 NOTE — TOC Initial Note (Signed)
Transition of Care [facility]) - Initial/Assessment Note    Patient Details  Name: Aaron Klein MRN: [id number] Date of Birth: 20-Mar-1946  Transition of Care [facility]) CM/SW Contact:    Bartholomew Crews, RN Phone Number: [phone] 12/07/2019, 1:20 PM  Clinical Narrative:                  Spoke with patient and girlfriend at the bedside. PTA home alone. Has a RW at home. Discussed recommendations for [facility] PT. Patient declined at this time, but verbalized understanding to follow up with PCP in the next week or two, and that PCP can refer for [facility] as well if needed. TOC following for transition needs.  Expected Discharge Plan: Home/Self Care Barriers to Discharge: Continued Medical Work up   Patient Goals and CMS Choice Patient states their goals for this hospitalization and ongoing recovery are:: return with girlfriend support CMS Medicare.gov Compare Post Acute Care list provided to:: Patient Choice offered to / list presented to : Patient  Expected Discharge Plan and Services Expected Discharge Plan: Home/Self Care In-house Referral: NA Discharge Planning Services: CM Consult Post Acute Care Choice: [place] arrangements for the past 2 months: Single Family Home                 DME Arranged: N/A DME Agency: NA       HH Arranged: Refused [place] Agency: NA        Prior Living Arrangements/Services Living arrangements for the past 2 months: Single Family Home Lives with:: Self Patient language and need for interpreter reviewed:: Yes Do you feel safe going back to the place where you live?: Yes      Need for Family Participation in Patient Care: No (Comment)   Current home services: DME (RW) Criminal Activity/Legal Involvement Pertinent to Current Situation/Hospitalization: No - Comment as needed  Activities of Daily Living Home Assistive Devices/Equipment: None ADL Screening (condition at time of admission) Patient's cognitive ability adequate to safely complete daily  activities?: Yes Is the patient deaf or have difficulty hearing?: No Does the patient have difficulty seeing, even when wearing glasses/contacts?: No Does the patient have difficulty concentrating, remembering, or making decisions?: No Patient able to express need for assistance with ADLs?: Yes Does the patient have difficulty dressing or bathing?: No Independently performs ADLs?: Yes (appropriate for developmental age) Does the patient have difficulty walking or climbing stairs?: Yes Weakness of Legs: Both Weakness of Arms/Hands: None  Permission Sought/Granted Permission sought to share information with : Family Supports    Share Information with NAME: Aaron Klein     Permission granted to share info w Relationship: girlfriend  Permission granted to share info w Contact Information: [phone]  Emotional Assessment Appearance:: Appears stated age Attitude/Demeanor/Rapport: Engaged Affect (typically observed): Accepting Orientation: : Oriented to Self, Oriented to  Time, Oriented to Place, Oriented to Situation Alcohol / Substance Use: Not Applicable Psych Involvement: No (comment)  Admission diagnosis:  Urinary retention [R33.9] Kidney failure [N19] Renal failure [N19] Acute renal failure, unspecified acute renal failure type [facility]) [N17.9] Patient Active Problem List   Diagnosis Date Noted  . Kidney failure 12/05/2019  . Renal failure 12/05/2019  . 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 [place] [address] Phone: [phone] Fax: [phone]  Social Determinants of Health (SDOH) Interventions    Readmission Risk Interventions No flowsheet data found.

## 2019-12-07 NOTE — Plan of Care (Signed)
  Problem: Education: Goal: Knowledge of General Education information will improve Description Including pain rating scale, medication(s)/side effects and non-pharmacologic comfort measures Outcome: Progressing   Problem: Health Behavior/Discharge Planning: Goal: Ability to manage health-related needs will improve Outcome: Progressing   

## 2019-12-07 NOTE — Progress Notes (Signed)
Occupational Therapy Evaluation Patient Details Name: Aaron Klein MRN: [id number] DOB: Dec 30, 1945 Today's Date: 12/07/2019    History of Present Illness Aaron Klein is a 74 y.o. male presenting with acute renal failure secondary to bladder outlet obstruction. PMH is significant for laryngeal cancer, GERD, HLD.   Clinical Impression   Patient lives at home alone and is independent at baseline.  Today patient stating he feels weaker than normal at this time.  He demonstrated decreased activity tolerance and general strength.  Patient able to perform LB ADLs and mobility with min guard and no devices. UB ADLs with supervision.  Will continue to follow with OT acutely to increase independence with ADLs to baseline for safety.      Follow Up Recommendations  No OT follow up    Equipment Recommendations  None recommended by OT    Recommendations for Other Services       Precautions / Restrictions Precautions Precautions: Fall Restrictions Weight Bearing Restrictions: No      Mobility Bed Mobility Overal bed mobility: Modified Independent                Transfers Overall transfer level: Needs assistance Equipment used: None Transfers: Sit to/from Stand Sit to Stand: Min guard              Balance Overall balance assessment: Mild deficits observed, not formally tested                                         ADL either performed or assessed with clinical judgement   ADL Overall ADL's : Needs assistance/impaired Eating/Feeding: Independent;Sitting   Grooming: Supervision/safety;Standing   Upper Body Bathing: Sitting;Supervision/ safety   Lower Body Bathing: Min guard;Sit to/from stand   Upper Body Dressing : Supervision/safety;Sitting   Lower Body Dressing: Min guard;Sit to/from stand   Toilet Transfer: Min guard;Ambulation;Regular Museum/gallery exhibitions officer and Hygiene: Min guard       Functional mobility  during ADLs: Min guard       Vision         Perception     Praxis      Pertinent Vitals/Pain Pain Assessment: No/denies pain     Hand Dominance Right   Extremity/Trunk Assessment Upper Extremity Assessment Upper Extremity Assessment: Overall WFL for tasks assessed           Communication Communication Communication: No difficulties   Cognition Arousal/Alertness: Awake/alert Behavior During Therapy: WFL for tasks assessed/performed Overall Cognitive Status: Within Functional Limits for tasks assessed                                     General Comments       Exercises     Shoulder Instructions      Home Living Family/patient expects to be discharged to:: Private residence Living Arrangements: Alone Available Help at Discharge: Friend(s);Available PRN/intermittently Type of Home: House Home Access: Stairs to enter [employer] of Steps: 2 Entrance Stairs-Rails: None Home Layout: One level     Bathroom Shower/Tub: Walk-in shower         Home Equipment: Environmental consultant - 2 wheels;Shower seat          Prior Functioning/Environment Level of Independence: Independent  OT Problem List: Decreased activity tolerance;Impaired balance (sitting and/or standing)      OT Treatment/Interventions: Self-care/ADL training;Therapeutic exercise;Therapeutic activities;Patient/family education;Balance training    OT Goals(Current goals can be found in the care plan section) Acute Rehab OT Goals Patient Stated Goal: get home soon OT Goal Formulation: With patient Time For Goal Achievement: 12/21/19  OT Frequency: Min 2X/week   Barriers to D/C:            Co-evaluation              AM-PAC OT "6 Clicks" Daily Activity     Outcome Measure Help from another person eating meals?: None Help from another person taking care of personal grooming?: A Little Help from another person toileting, which includes using toliet,  bedpan, or urinal?: A Little Help from another person bathing (including washing, rinsing, drying)?: A Little Help from another person to put on and taking off regular upper body clothing?: A Little Help from another person to put on and taking off regular lower body clothing?: A Little 6 Click Score: 19   End of Session Equipment Utilized During Treatment: Gait belt Nurse Communication: Mobility status  Activity Tolerance: Patient tolerated treatment well Patient left: in bed;with call bell/phone within reach  OT Visit Diagnosis: Unsteadiness on feet (R26.81)                Time: 7841-2820 OT Time Calculation (min): 11 min Charges:  OT General Charges $OT Visit: 1 Visit OT Evaluation $OT Eval Moderate Complexity: 1 Mod  August Luz, OTR/L   Phylliss Bob 12/07/2019, 10:18 AM

## 2019-12-07 NOTE — Discharge Summary (Signed)
[facility] Discharge Summary  Patient name: Aaron Klein Medical record number: [id number] Date of birth: [birth year] Age: 74 y.o. Gender: male Date of Admission: 12/05/2019  Date of Discharge: 12/08/19 Admitting Physician: Concepcion Living, Medical Student  Primary Care Provider: Aletha Halim., PA-C Consultants: None  Indication for Hospitalization: Acute renal failure  Discharge Diagnoses/Problem List:  Active Problems:   Kidney failure   Renal failure  Disposition: Home  Discharge Condition: Stable  Discharge Exam: General: Alert and oriented in no apparent distress Heart: S1, S2 with no murmurs appreciated Lungs: CTA bilaterally, no wheezing Abdomen: Bowel sounds present, no abdominal pain Skin: Warm and dry Extremities: No lower extremity edema  Brief Hospital Course:  Patient is a 74 year old male that presented to the hospital with acute renal failure secondary to bladder outlet obstruction.  In the emergency department patient was noted to have creatinine elevated at 6.25.  Patient received a Foley catheter and patient's creatinine improved dramatically down to 1.48 on 12/07/2019.  Patient CT scan was consistent with bladder outlet obstruction with nephrology been consulted early in the hospitalization.  Patient was also started on Flomax 0.4 mg daily.  Electrolyte abnormalities The hospitalization patient was noted to have potassium elevated 6.5.  Patient received Lokelma, Lasix, bicarb and this slowly improved along with the parents urinary output obstruction.  Potassium on discharge was 4.1.  Sodium follow the same trend with sodium initially been decreased at 118 and slowly improving to 134 by 12/07/2019.  On discharge patient sodium was 133.  Patient BUN also down trended with initial at 84, improving to 61 and then 23 on 12/07/2019.  Patient's BUN on day of discharge was15.  EKG findings On 6/14 patient was noted to have a strip of on  his telemetry which looked abnormal.  An EKG was ordered which showed new 2nd degree mobitz 1 heart block. The patient had no symptoms from this.  Issues for Follow Up:  1. Patient discharged with foley, follow up with urology regarding foley and urinary retention  Significant Procedures:   Significant Labs and Imaging:  Recent Labs  Lab 12/06/19 0408 12/07/19 0457 12/08/19 0603  WBC 6.7 6.6 6.7  HGB 11.3* 12.2* 12.3*  HCT 32.2* 35.6* 36.4*  PLT 274 333 337   Recent Labs  Lab 12/05/19 1036 12/05/19 1708 12/06/19 2039 12/06/19 2039 12/07/19 0022 12/07/19 0022 12/07/19 0457 12/07/19 0457 12/07/19 0812 12/08/19 0603  NA 118*   < > 132*  --  132*  --  134*  --  132* 133*  K 5.7*   < > 4.1   < > 4.4   < > 4.2   < > 4.1 4.1  CL 82*   < > 92*  --  93*  --  95*  --  95* 96*  CO2 18*   < > 30  --  30  --  28  --  29 28  GLUCOSE 90   < > 116*  --  105*  --  119*  --  111* 110*  BUN 84*   < > 32*  --  27*  --  23  --  21 15  CREATININE 10.49*   < > 2.05*  --  1.67*  --  1.48*  --  1.29* 1.01  CALCIUM 8.9   < > 8.8*  --  8.8*  --  9.0  --  9.0 9.0  ALKPHOS 81  --   --   --   --   --   --   --   --   --  AST 21  --   --   --   --   --   --   --   --   --   ALT 21  --   --   --   --   --   --   --   --   --   ALBUMIN 3.1*  --   --   --   --   --   --   --   --   --    < > = values in this interval not displayed.      Results/Tests Pending at Time of Discharge: None  Discharge Medications:  Allergies as of 12/08/2019   No Known Allergies     Medication List    STOP taking these medications   ibuprofen 200 MG tablet Commonly known as: ADVIL   sulfamethoxazole-trimethoprim 800-160 MG tablet Commonly known as: BACTRIM DS     TAKE these medications   aspirin EC 81 MG tablet Take 81 mg by mouth daily.   atorvastatin 10 MG tablet Commonly known as: LIPITOR Take 10 mg by mouth daily.   celecoxib 200 MG capsule Commonly known as: CELEBREX Take 200 mg by mouth  daily.   FISH OIL PO Take 2,000 mg by mouth daily.   Flaxseed Oil 1200 MG Caps Take 2,400 mg by mouth daily.   gabapentin 100 MG capsule Commonly known as: NEURONTIN Take 100 mg by mouth 3 (three) times daily.   HEALTHY EYES PO Take 1 capsule by mouth daily.   HYDROcodone-acetaminophen 10-325 MG tablet Commonly known as: NORCO Take 1 tablet by mouth every 6 (six) hours as needed for moderate pain.   Lecithin 1200 MG Caps Take 2,400 mg by mouth daily.   multivitamin tablet Take 1 tablet by mouth daily.   OVER THE COUNTER MEDICATION Take 5,000 mg by mouth daily. CBD Capsule 5000 mg Supplement   pantoprazole 40 MG tablet Commonly known as: PROTONIX Take 40 mg by mouth daily.   tamsulosin 0.4 MG Caps capsule Commonly known as: FLOMAX Take 1 capsule (0.4 mg total) by mouth daily. Start taking on: December 09, 2019   traMADol 50 MG tablet Commonly known as: ULTRAM Take 50 mg by mouth 2 (two) times daily.   vitamin C 1000 MG tablet Take 1,000 mg by mouth daily.   Vitamin D3 25 MCG (1000 UT) Caps Take 1,000 Units by mouth daily.   vitamin E 180 MG (400 UNITS) capsule Take 400 Units by mouth daily.       Discharge Instructions: Please refer to Patient Instructions section of EMR for full details.  Patient was counseled important signs and symptoms that should prompt return to medical care, changes in medications, dietary instructions, activity restrictions, and follow up appointments.   Follow-Up Appointments:  Follow-up Information    Lucas Mallow, MD. Schedule an appointment as soon as possible for a visit.   Specialty: Urology Why: Call to make an appointment if you have not heard from Dr. Purvis Sheffield office in the next day.  this will be for the removal of you urinary catheter. Contact information: [place] [id number] [place], [place], DO 12/08/2019, 12:26 PM PGY-1, [place]

## 2019-12-08 LAB — BASIC METABOLIC PANEL
Anion gap: 9 (ref 5–15)
BUN: 15 mg/dL (ref 8–23)
CO2: 28 mmol/L (ref 22–32)
Calcium: 9 mg/dL (ref 8.9–10.3)
Chloride: 96 mmol/L — ABNORMAL LOW (ref 98–111)
Creatinine, Ser: 1.01 mg/dL (ref 0.61–1.24)
GFR calc Af Amer: >60 mL/min (ref >60)
GFR calc non Af Amer: >60 mL/min (ref >60)
Glucose, Bld: 110 mg/dL — ABNORMAL HIGH (ref 70–99)
Potassium: 4.1 mmol/L (ref 3.5–5.1)
Sodium: 133 mmol/L — ABNORMAL LOW (ref 135–145)

## 2019-12-08 LAB — CBC
HCT: 36.4 % — ABNORMAL LOW (ref 39.0–52.0)
Hemoglobin: 12.3 g/dL — ABNORMAL LOW (ref 13.0–17.0)
MCH: 29.6 pg (ref 26.0–34.0)
MCHC: 33.8 g/dL (ref 30.0–36.0)
MCV: 87.7 fL (ref 80.0–100.0)
Platelets: 337 10*3/uL (ref 150–400)
RBC: 4.15 MIL/uL — ABNORMAL LOW (ref 4.22–5.81)
RDW: 12.8 % (ref 11.5–15.5)
WBC: 6.7 10*3/uL (ref 4.0–10.5)
nRBC: 0 % (ref 0.0–0.2)

## 2019-12-08 MED ORDER — TAMSULOSIN HCL 0.4 MG PO CAPS: 0.4000 mg | ORAL_CAPSULE | Freq: Every day | ORAL | 0 refills | Status: AC

## 2019-12-08 NOTE — Progress Notes (Signed)
DISCHARGE NOTE HOME SEMISI BIELA to be discharged Home per MD order. Discussed prescriptions and follow up appointments with the patient. Prescriptions given to patient; medication list explained in detail. Patient verbalized understanding.  Skin clean, dry and intact without evidence of skin break down, no evidence of skin tears noted. IV catheter discontinued intact. Site without signs and symptoms of complications. Dressing and pressure applied. Pt denies pain at the site currently. No complaints noted.  Patient free of lines, drains, and wounds.   An After Visit Summary (AVS) was printed and given to the patient. Patient escorted via wheelchair, and discharged home via private auto.  Dolores Hoose, RN

## 2019-12-08 NOTE — Progress Notes (Signed)
Physical Therapy Treatment Patient Details Name: Aaron Klein MRN: [id number] DOB: 1946-06-04 Today's Date: 12/08/2019    History of Present Illness Aaron Klein is a 74 y.o. male presenting with acute renal failure secondary to bladder outlet obstruction. PMH is significant for laryngeal cancer, GERD, HLD.    PT Comments    Continuing work on functional mobility and activity tolerance;  Notably better balance and steadiness with amb today, able to ascend and descend a flight of steps with little difficulty; OK for dc home from PT standpoint    Follow Up Recommendations  Home health PT     Equipment Recommendations  None recommended by PT    Recommendations for Other Services       Precautions / Restrictions Precautions Precautions: Fall Precaution Comments: fall risk significantly decreased from PT eval Restrictions Weight Bearing Restrictions: No    Mobility  Bed Mobility Overal bed mobility: Modified Independent                Transfers Overall transfer level: Modified independent   Transfers: Sit to/from Stand Sit to Stand: Modified independent (Device/Increase time)         General transfer comment: much steadier at initial stand  Ambulation/Gait Ambulation/Gait assistance: Supervision Gait Distance (Feet): 120 Feet Assistive device: None Gait Pattern/deviations: Step-through pattern Gait velocity: slowed   General Gait Details: Much steadier today, with no need for RW for UE support   Stairs Stairs: Yes Stairs assistance: Min guard Stair Management: One rail Right;Alternating pattern;Forwards Number of Stairs: 12 General stair comments: Overall managed stairs well; one instance of toe-catching going up which he was able to revore from without difficulty   Wheelchair Mobility    Modified Rankin (Stroke Patients Only)       Balance                                            Cognition Arousal/Alertness:  Awake/alert Behavior During Therapy: WFL for tasks assessed/performed Overall Cognitive Status: Within Functional Limits for tasks assessed                                        Exercises      General Comments        Pertinent Vitals/Pain Pain Assessment: No/denies pain    Home Living                      Prior Function            PT Goals (current goals can now be found in the care plan section) Acute Rehab PT Goals Patient Stated Goal: get home soon PT Goal Formulation: With patient Time For Goal Achievement: 12/20/19 Potential to Achieve Goals: Good Progress towards PT goals: Progressing toward goals    Frequency    Min 3X/week      PT Plan Current plan remains appropriate;Equipment recommendations need to be updated    Co-evaluation              AM-PAC PT "6 Clicks" Mobility   Outcome Measure  Help needed turning from your back to your side while in a flat bed without using bedrails?: None Help needed moving from lying on your back to sitting on the side of a flat bed without  using bedrails?: None Help needed moving to and from a bed to a chair (including a wheelchair)?: None Help needed standing up from a chair using your arms (e.g., wheelchair or bedside chair)?: None Help needed to walk in hospital room?: None Help needed climbing 3-5 steps with a railing? : None 6 Click Score: 24    End of Session Equipment Utilized During Treatment: Gait belt Activity Tolerance: Patient tolerated treatment well Patient left: in chair;with call bell/phone within reach;with family/visitor present Nurse Communication: Mobility status PT Visit Diagnosis: Unsteadiness on feet (R26.81);Other abnormalities of gait and mobility (R26.89)     Time: 5465-6812 PT Time Calculation (min) (ACUTE ONLY): 10 min  Charges:  $Gait Training: 8-22 mins                     Roney Marion, [place] Pager [phone] Office  [place] 12/08/2019, 1:55 PM

## 2019-12-08 NOTE — Care Management Important Message (Signed)
Important Message  Patient Details  Name: Aaron Klein MRN: [id number] Date of Birth: March 29, 1946   Medicare Important Message Given:  Yes  Patient left prior to IM delivery .  IM mailed to the patient home address   Orbie Pyo 12/08/2019, 2:27 PM

## 2019-12-08 NOTE — Care Management Important Message (Signed)
Important Message  Patient Details  Name: Aaron Klein MRN: [id number] Date of Birth: [birth year]   Medicare Important Message Given:  Yes  Patient left prior to IM delivery.  IM mailed to the patient home.    [name] 12/08/2019, 4:06 PM

## 2019-12-08 NOTE — Plan of Care (Signed)
  Problem: Education: Goal: Knowledge of General Education information will improve Description: Including pain rating scale, medication(s)/side effects and non-pharmacologic comfort measures Outcome: Progressing   Problem: Coping: Goal: Level of anxiety will decrease Outcome: Progressing   

## 2019-12-08 NOTE — Plan of Care (Signed)
  Problem: Education: Goal: Knowledge of General Education information will improve Description: Including pain rating scale, medication(s)/side effects and non-pharmacologic comfort measures 12/08/2019 1232 by Dolores Hoose, RN Outcome: Adequate for Discharge 12/08/2019 1104 by Dolores Hoose, RN Outcome: Progressing   Problem: Health Behavior/Discharge Planning: Goal: Ability to manage health-related needs will improve Outcome: Adequate for Discharge   Problem: Clinical Measurements: Goal: Ability to maintain clinical measurements within normal limits will improve Outcome: Adequate for Discharge Goal: Will remain free from infection Outcome: Adequate for Discharge Goal: Diagnostic test results will improve Outcome: Adequate for Discharge Goal: Respiratory complications will improve Outcome: Adequate for Discharge Goal: Cardiovascular complication will be avoided Outcome: Adequate for Discharge

## 2019-12-08 NOTE — Discharge Instructions (Signed)
It was a pleasure taking care of you during your recent hospitalization.  You were hospitalized due to acute renal failure because of bladder obstruction.  During your hospitalization a Foley catheter was placed which help drain your bladder.  After this occurred your electrolytes improved.  You are being discharged with a Foley catheter in place.  It is very important that you go to urology follow-up within the next week after discharge.  If you do not hear from them it is important that you reach out to them to ensure your follow-up was scheduled.

## 2019-12-11 LAB — CULTURE, BLOOD (ROUTINE X 2)
Culture: NO GROWTH
Culture: NO GROWTH
Special Requests: ADEQUATE

## 2019-12-30 ENCOUNTER — Other Ambulatory Visit: Payer: Self-pay | Admitting: Family Medicine

## 2020-03-21 ENCOUNTER — Telehealth: Payer: Self-pay | Admitting: *Deleted

## 2020-03-21 NOTE — Telephone Encounter (Signed)
CALLED PATIENT TO REMIND OF LAB APPT. FOR 03-22-20 @ 1:30 PM, PRIOR TO FU APPT. WITH DR. Azucena Cecil, LVM FOR A RETURN CALL

## 2020-03-22 ENCOUNTER — Ambulatory Visit
Admission: RE | Admit: 2020-03-22 | Discharge: 2020-03-22 | Disposition: A | Discharge status: Home / Self Care | Payer: Medicare HMO | Source: Ambulatory Visit | Attending: Radiation Oncology | Admitting: Radiation Oncology

## 2020-03-22 ENCOUNTER — Other Ambulatory Visit: Payer: Self-pay

## 2020-03-22 VITALS — BP 115/79 | HR 60 | Temp 98.4°F | Resp 18 | Ht 70.0 in | Wt 170.5 lb

## 2020-03-22 DIAGNOSIS — C320 Malignant neoplasm of glottis: Secondary | ICD-10-CM | POA: Insufficient documentation

## 2020-03-22 DIAGNOSIS — Z1329 Encounter for screening for other suspected endocrine disorder: Secondary | ICD-10-CM | POA: Diagnosis not present

## 2020-03-22 DIAGNOSIS — Z8521 Personal history of malignant neoplasm of larynx: Secondary | ICD-10-CM | POA: Insufficient documentation

## 2020-03-22 DIAGNOSIS — F17210 Nicotine dependence, cigarettes, uncomplicated: Secondary | ICD-10-CM | POA: Diagnosis not present

## 2020-03-22 DIAGNOSIS — Z923 Personal history of irradiation: Secondary | ICD-10-CM | POA: Diagnosis present

## 2020-03-22 LAB — TSH: TSH: 0.778 u[IU]/mL (ref 0.320–4.118)

## 2020-03-22 MED ORDER — LARYNGOSCOPY SOLUTION RAD-ONC
15.0000 mL | Freq: Once | TOPICAL | Status: AC
  Administered 2020-03-22: 15 mL via TOPICAL
  Filled 2020-03-22: qty 15

## 2020-03-22 NOTE — Progress Notes (Signed)
Aaron Klein presents for follow up of radiation completed on 06/20/2018 to his larynx.   Pain issues, if any: Patient denies any neck/throat pain. Deals with chronic back pain Using a feeding tube?: N/A Weight changes, if any: Wt Readings from Last 3 Encounters:  03/22/20 170 lb 8 oz (77.3 kg)  12/07/19 166 lb 12.8 oz (75.7 kg)  11/26/19 178 lb 6.4 oz (80.9 kg)   Swallowing issues, if any: Patient denies. He states he occasionally has trouble swallowing pills, but otherwise does not have to avoid certain foods/drinks. Smoking or chewing tobacco? Smokes cigarettes sporadically Using fluoride trays daily? N/A Last ENT visit was on:  08/12/2019 Saw Dr. Lind Guest: "Procedure: Transnasal Flexible Laryngoscopy: Findings: The nasal cavity and nasopharynx are unremarkable. There are no suspicious findings in the nasopharynx or Fossa of [place]. The tongue base, pharyngeal walls, piriform sinuses are normal in appearance. The vocal cords are normal in appearance; he has healed nicely since recovering from radiation. No obvious masses.The visualized portion of the subglottis and proximal trachea is widely patent. The vocal folds are mobile bilaterally. There is no pooling of secretions or aspiration. Plan:  1. RTC early June, sooner if problems arise 2. He has not had follow-up with Dr. Isidore Moos, radiation oncology. I have encouraged him to call and get follow-up with her as well. 3. Continue smoking cessation again."  Other notable issues, if any: Patient has received both Pfizer vaccines, but he can't recall the dates. Denies any lymphedema or issues sleeping, and overall fells well now that things are "back to normal" since hospitalization after colonoscopy.   Vitals:   03/22/20 1408  BP: 115/79  Pulse: 60  Resp: 18  Temp: 98.4 F (36.9 C)  SpO2: 98%

## 2020-03-24 NOTE — Progress Notes (Signed)
Radiation Oncology         ([phone]) [phone] ________________________________  Name: Aaron Klein MRN: [id number]  Date: 03/22/2020  DOB: March 18, 1946  Follow-Up Visit Note in person  CC: Aaron Halim., PA-C  Aaron Seminole, MD  Diagnosis and Prior Radiotherapy:       ICD-10-CM   1. Malignant neoplasm of glottis ([place])  C32.0 laryngocopy solution for Rad-Onc    Fiberoptic laryngoscopy    Cancer Staging Malignant neoplasm of glottis ([place]) Staging form: Larynx - Glottis, AJCC 8th Edition - Clinical: Stage I (cT1a, cN0, cM0) - Signed by Aaron Gibson, MD on 05/06/2018  05/12/2018-06/20/2018: Larynx, 2.25 Gy x 28 fractions for a total dose of 63 Gy  CHIEF COMPLAINT:  Here for follow-up and surveillance of glottic cancer  Narrative:    Aaron Klein presents for follow up of radiation completed on 06/20/2018 to his larynx.   Pain issues, if any: Patient denies any neck/throat pain. Deals with chronic back pain Using a feeding tube?: N/A Weight changes, if any: Wt Readings from Last 3 Encounters:  03/22/20 170 lb 8 oz (77.3 kg)  12/07/19 166 lb 12.8 oz (75.7 kg)  11/26/19 178 lb 6.4 oz (80.9 kg)   Swallowing issues, if any: Patient denies. He states he occasionally has trouble swallowing pills, but otherwise does not have to avoid certain foods/drinks. Smoking or chewing tobacco? Smokes cigarettes sporadically Using fluoride trays daily? N/A Last ENT visit was on:  08/12/2019 Saw Dr. Lind Klein: "Procedure: Transnasal Flexible Laryngoscopy: Findings: The nasal cavity and nasopharynx are unremarkable. There are no suspicious findings in the nasopharynx or Fossa of [place]. The tongue base, pharyngeal walls, piriform sinuses are normal in appearance. The vocal cords are normal in appearance; he has healed nicely since recovering from radiation. No obvious masses.The visualized portion of the subglottis and proximal trachea is widely patent. The vocal folds are mobile  bilaterally. There is no pooling of secretions or aspiration.   Other notable issues, if any: Patient has received both Pfizer vaccines, but he can't recall the dates; he thinks quite recently. Denies any lymphedema or issues sleeping, and overall fells well now that things are "back to normal" since hospitalization after colonoscopy.   Vitals:   03/22/20 1408  BP: 115/79  Pulse: 60  Resp: 18  Temp: 98.4 F (36.9 C)  SpO2: 98%                      ALLERGIES:  has No Known Allergies.  Meds: Current Outpatient Medications  Medication Sig Dispense Refill  . Ascorbic Acid (VITAMIN C) 1000 MG tablet Take 1,000 mg by mouth daily.    Marland Kitchen aspirin EC 81 MG tablet Take 81 mg by mouth daily.    Marland Kitchen atorvastatin (LIPITOR) 10 MG tablet Take 10 mg by mouth daily.     . celecoxib (CELEBREX) 200 MG capsule Take 200 mg by mouth daily.     . Cholecalciferol (VITAMIN D3) 1000 units CAPS Take 1,000 Units by mouth daily.     . Flaxseed, Linseed, (FLAXSEED OIL) 1200 MG CAPS Take 2,400 mg by mouth daily.     Marland Kitchen gabapentin (NEURONTIN) 100 MG capsule Take 100 mg by mouth 3 (three) times daily.    Marland Kitchen HYDROcodone-acetaminophen (NORCO) 10-325 MG per tablet Take 1 tablet by mouth every 6 (six) hours as needed for moderate pain.     . Lecithin 1200 MG CAPS Take 2,400 mg by mouth daily.     Marland Kitchen  Multiple Vitamin (MULTIVITAMIN) tablet Take 1 tablet by mouth daily.    . Multiple Vitamins-Minerals (HEALTHY EYES PO) Take 1 capsule by mouth daily.    . Omega-3 Fatty Acids (FISH OIL PO) Take 2,000 mg by mouth daily.    . pantoprazole (PROTONIX) 40 MG tablet Take 40 mg by mouth daily.     . tamsulosin (FLOMAX) 0.4 MG CAPS capsule Take 1 capsule (0.4 mg total) by mouth daily. 30 capsule 0  . traMADol (ULTRAM) 50 MG tablet Take 50 mg by mouth 2 (two) times daily.    . vitamin E 400 UNIT capsule Take 400 Units by mouth daily.      No current facility-administered medications for this encounter.    Physical Findings: The  patient is in no acute distress. Patient is alert and oriented. Wt Readings from Last 3 Encounters:  03/22/20 170 lb 8 oz (77.3 kg)  12/07/19 166 lb 12.8 oz (75.7 kg)  11/26/19 178 lb 6.4 oz (80.9 kg)    height is 5\' 10"  (1.778 m) and weight is 170 lb 8 oz (77.3 kg). His temperature is 98.4 F (36.9 C). His blood pressure is 115/79 and his pulse is 60. His respiration is 18 and oxygen saturation is 98%. .  General: Alert and oriented, in no acute distress HEENT: Head is normocephalic. Extraocular movements are intact. Oropharynx and mouth are clear Neck: No palpable masses. Skin: Skin in treatment fields shows satisfactory healing  Lymphatics: see Neck Exam Psychiatric: Judgment and insight are intact. Affect is appropriate.  PROCEDURE NOTE: After obtaining consent and anesthetizing the nasal cavity with topical lidocaine and phenylephrine, the flexible endoscope was introduced and passed through the nasal cavity.  No lesions appreciated in the nasopharynx, pharynx, hypopharynx, or larynx. Cords are symmetrically mobile and without lesions.  Lab Findings: Lab Results  Component Value Date   WBC 6.7 12/08/2019   HGB 12.3 (L) 12/08/2019   HCT 36.4 (L) 12/08/2019   MCV 87.7 12/08/2019   PLT 337 12/08/2019    Lab Results  Component Value Date   TSH 0.778 03/22/2020    Radiographic Findings: No results found.  Impression/Plan:    1) Head and Neck Cancer Status: NED  2) Nutritional Status: No issues PEG tube: N/A  3) Risk Factors: The patient has been educated about risk factors including alcohol and tobacco abuse; they understand that avoidance of alcohol and tobacco is important to prevent recurrences as well as other cancers  4) Swallowing: functional  5) Thyroid function: WNL Lab Results  Component Value Date   TSH 0.778 03/22/2020    6) I asked the patient today about tobacco use. The patient uses tobacco.  I again urged and advised the patient to quit.  The patient  has follow-up with the oncologic team to touch base on their tobacco use and /or cessation efforts.   He states he will work on this.   7)Follow-up in 10 months with repeat TSH testing.  We will arrange a f/u with ENT in 4-5 mo. The patient was encouraged to call with any issues or questions before then.   On date of service, in total, I spent 25 minutes on this encounter. Patient was seen in person.   _____________________________________   Aaron Gibson, MD

## 2020-03-28 NOTE — Progress Notes (Signed)
Oncology Nurse Navigator Documentation  Per patient's 03/22/20 post-treatment follow-up with Dr. Isidore Moos, sent fax to [facility] ENT Scheduling with request Aaron Klein be contacted and scheduled for routine post-RT follow-up in 4-5 months with the MD replacing Dr. Blenda Nicely.  Notification of successful fax transmission received.  Harlow Asa RN, BSN, OCN Head & Neck Oncology Nurse [place] at [facility] Phone # [phone]  Fax # [phone]

## 2021-01-20 ENCOUNTER — Ambulatory Visit
Admission: RE | Admit: 2021-01-20 | Discharge: 2021-01-20 | Disposition: A | Discharge status: Home / Self Care | Payer: Medicare HMO | Source: Ambulatory Visit | Attending: Radiation Oncology | Admitting: Radiation Oncology

## 2021-01-20 ENCOUNTER — Other Ambulatory Visit: Payer: Self-pay

## 2021-01-20 VITALS — BP 127/86 | HR 73 | Temp 96.9°F | Resp 18 | Ht 70.0 in | Wt 168.2 lb

## 2021-01-20 DIAGNOSIS — Z923 Personal history of irradiation: Secondary | ICD-10-CM | POA: Insufficient documentation

## 2021-01-20 DIAGNOSIS — Z79899 Other long term (current) drug therapy: Secondary | ICD-10-CM | POA: Insufficient documentation

## 2021-01-20 DIAGNOSIS — C320 Malignant neoplasm of glottis: Secondary | ICD-10-CM

## 2021-01-20 DIAGNOSIS — Z8521 Personal history of malignant neoplasm of larynx: Secondary | ICD-10-CM | POA: Diagnosis present

## 2021-01-20 DIAGNOSIS — F17210 Nicotine dependence, cigarettes, uncomplicated: Secondary | ICD-10-CM | POA: Diagnosis not present

## 2021-01-20 DIAGNOSIS — Z7982 Long term (current) use of aspirin: Secondary | ICD-10-CM | POA: Insufficient documentation

## 2021-01-20 MED ORDER — OXYMETAZOLINE HCL 0.05 % NA SOLN
2.0000 | Freq: Once | NASAL | Status: AC
  Administered 2021-01-20: 2 via NASAL

## 2021-01-20 NOTE — Progress Notes (Signed)
Aaron Klein presents for follow up of radiation completed on 06/20/2018 to his larynx.  Pain issues, if any: Patient denies Using a feeding tube?: N/A Weight changes, if any:  Wt Readings from Last 3 Encounters:  01/20/21 168 lb 4 oz (76.3 kg)  03/22/20 170 lb 8 oz (77.3 kg)  12/07/19 166 lb 12.8 oz (75.7 kg)   Swallowing issues, if any: Patient denies. Reports he can eat/drink a wide variety Smoking or chewing tobacco? Reports smoking 1 cigarette occasionally  Using fluoride trays daily? N/A--full set of dentures Last ENT visit was on: 08/16/2020 Saw Aaron Klein:  "--He has been doing well since his last visit here in 2/21. He has no complaints. He is swallowing well. He denies throat pain. His voice is stable. He is only smoking once in a while --Procedure: Transnasal fiberoptic laryngoscopy Findings included normal nasal passages, no mass or abnormality in the nasopharynx, and no mass or ulceration in the pharynx or larynx. Pyriform sinuses are open. Secretions are minimal. Vocal folds are without mass, scarring, or ulceration. There is some edema of the vocal folds. The vocal folds adduct and abduct symmetrically. There is good glottal closure. Muscle tension patterns are not present. Laryngeal edema is mild with posterior commissure hypertrophy. There are radiation changes with scattered red blotches in the endolarynx. --Impression & Plans He is now over two years from completing treatments with no evidence of recurrence. He follows-up with Aaron Klein in September. He will follow-up with me in one year"  Other notable issues, if any: Denies any ear or jaw pain, or difficulty opening his mouth. Denies any changes in dry mouth, or issues with thick saliva. Reports a stable appetite. Denies any signs/symptoms of lymphedema to chin or neck. Denies any changes in bowel or urinary habits. Overall, reports he feels well  Vitals:   01/20/21 1439  BP: 127/86  Pulse: 73  Resp: 18  Temp: (!)  96.9 F (36.1 C)  SpO2: 95%

## 2021-01-21 ENCOUNTER — Encounter: Payer: Self-pay | Admitting: Radiation Oncology

## 2021-01-21 NOTE — Progress Notes (Signed)
Radiation Oncology         [phone]) [phone] ________________________________  Name: Aaron Klein MRN: [id number]  Date: 01/20/2021  DOB: Jun 06, 1946  Follow-Up Visit Note in person  CC: Aletha Halim., PA-C  Melida Quitter MD  Diagnosis and Prior Radiotherapy:       ICD-10-CM   1. Malignant neoplasm of glottis ([facility])  C32.0 oxymetazoline (AFRIN) 0.05 % nasal spray 2 spray    Fiberoptic laryngoscopy      Cancer Staging Malignant neoplasm of glottis ([facility]) Staging form: Larynx - Glottis, AJCC 8th Edition - Clinical: Stage I (cT1a, cN0, cM0) - Signed by Eppie Gibson, MD on 05/06/2018  05/12/2018-06/20/2018: Larynx, 2.25 Gy x 28 fractions for a total dose of 63 Gy  CHIEF COMPLAINT:  Here for follow-up and surveillance of glottic cancer  Narrative:    [name] presents for follow up of radiation completed on 06/20/2018 to his larynx.  Pain issues, if any: Patient denies Using a feeding tube?: N/A Weight changes, if any:  Wt Readings from Last 3 Encounters:  01/20/21 168 lb 4 oz (76.3 kg)  03/22/20 170 lb 8 oz (77.3 kg)  12/07/19 166 lb 12.8 oz (75.7 kg)   Swallowing issues, if any: Patient denies. Reports he can eat/drink a wide variety Smoking or chewing tobacco? Reports smoking 1 cigarette occasionally  Using fluoride trays daily? N/A--full set of dentures Last ENT visit was on: 08/16/2020 Saw Dr. Melida Quitter:  "--He has been doing well since his last visit here in 2/21. He has no complaints. He is swallowing well. He denies throat pain. His voice is stable. He is only smoking once in a while --Procedure: Transnasal fiberoptic laryngoscopy Findings included normal nasal passages, no mass or abnormality in the nasopharynx, and no mass or ulceration in the pharynx or larynx. Pyriform sinuses are open. Secretions are minimal. Vocal folds are without mass, scarring, or ulceration. There is some edema of the vocal folds. The vocal folds adduct and abduct symmetrically. There is  good glottal closure. Muscle tension patterns are not present. Laryngeal edema is mild with posterior commissure hypertrophy. There are radiation changes with scattered red blotches in the endolarynx. --Impression & Plans He is now over two years from completing treatments with no evidence of recurrence. He follows-up with Dr. Isidore Moos in September. He will follow-up with me in one year"  Other notable issues, if any: Denies any ear or jaw pain, or difficulty opening his mouth. Denies any changes in dry mouth, or issues with thick saliva. Reports a stable appetite. Denies any signs/symptoms of lymphedema to chin or neck. Denies any changes in bowel or urinary habits. Overall, reports he feels well  Vitals:   01/20/21 1439  BP: 127/86  Pulse: 73  Resp: 18  Temp: (!) 96.9 F (36.1 C)  SpO2: 95%                          ALLERGIES:  has No Known Allergies.  Meds: Current Outpatient Medications  Medication Sig Dispense Refill   Ascorbic Acid (VITAMIN C) 1000 MG tablet Take 1,000 mg by mouth daily.     aspirin EC 81 MG tablet Take 81 mg by mouth daily.     atorvastatin (LIPITOR) 10 MG tablet Take 10 mg by mouth daily.      celecoxib (CELEBREX) 200 MG capsule Take 200 mg by mouth daily.      Cholecalciferol (VITAMIN D3) 1000 units CAPS Take 1,000 Units by  mouth daily.      finasteride (PROSCAR) 5 MG tablet Take 5 mg by mouth daily.     Flaxseed, Linseed, (FLAXSEED OIL) 1200 MG CAPS Take 2,400 mg by mouth daily.      gabapentin (NEURONTIN) 100 MG capsule Take 100 mg by mouth 3 (three) times daily.     HYDROcodone-acetaminophen (NORCO) 10-325 MG per tablet Take 1 tablet by mouth every 6 (six) hours as needed for moderate pain.      Lecithin 1200 MG CAPS Take 2,400 mg by mouth daily.      Multiple Vitamin (MULTIVITAMIN) tablet Take 1 tablet by mouth daily.     Multiple Vitamins-Minerals (HEALTHY EYES PO) Take 1 capsule by mouth daily.     Omega-3 Fatty Acids (FISH OIL PO) Take 2,000 mg by  mouth daily.     pantoprazole (PROTONIX) 40 MG tablet Take 40 mg by mouth daily.      tamsulosin (FLOMAX) 0.4 MG CAPS capsule Take 1 capsule (0.4 mg total) by mouth daily. 30 capsule 0   traMADol (ULTRAM) 50 MG tablet Take 50 mg by mouth 2 (two) times daily.     vitamin E 400 UNIT capsule Take 400 Units by mouth daily.      No current facility-administered medications for this encounter.    Physical Findings: The patient is in no acute distress. Patient is alert and oriented. Wt Readings from Last 3 Encounters:  01/20/21 168 lb 4 oz (76.3 kg)  03/22/20 170 lb 8 oz (77.3 kg)  12/07/19 166 lb 12.8 oz (75.7 kg)    height is '5\' 10"'$  (1.778 m) and weight is 168 lb 4 oz (76.3 kg). His temporal temperature is 96.9 F (36.1 C) (abnormal). His blood pressure is 127/86 and his pulse is 73. His respiration is 18 and oxygen saturation is 95%. .  General: Alert and oriented, in no acute distress HEENT: Head is normocephalic. Extraocular movements are intact. Oropharynx and mouth are clear (dentures removed) Neck: No palpable masses. Skin: Skin intact, healthy over neck Lymphatics: see Neck Exam HEART RRR CHEST CTAB Psychiatric: Judgment and insight are intact. Affect is appropriate.  PROCEDURE NOTE: After obtaining consent and spraying nasal cavity with oxymetazoline the flexible endoscope was introduced and passed through the nasal cavity.  No lesions appreciated in the nasopharynx, pharynx, hypopharynx, or larynx. Cords are symmetrically mobile and without lesions.  Lab Findings: Lab Results  Component Value Date   WBC 6.7 12/08/2019   HGB 12.3 (L) 12/08/2019   HCT 36.4 (L) 12/08/2019   MCV 87.7 12/08/2019   PLT 337 12/08/2019    Lab Results  Component Value Date   TSH 0.778 03/22/2020  [facility] 1.15 at [facility] in June 2022  Radiographic Findings: No results found.  Impression/Plan:    1) Head and Neck Cancer Status: NED  2) Nutritional Status: No issues PEG tube: N/A  3)  Risk Factors: The patient has been educated about risk factors including alcohol and tobacco abuse; they understand that avoidance of alcohol and tobacco is important to prevent recurrences as well as other cancers  4) Swallowing: functional  5) Thyroid function: WNL - check annually   6) I asked the patient today about tobacco use. The patient uses tobacco.  I again urged and advised the patient to quit.  The patient has follow-up with the oncologic team to touch base on their tobacco use and /or cessation efforts.   He states he will work on this.   7) Follow-up in  1 year and continue seeing ENT.   On date of service, in total, I spent 25 minutes on this encounter. Patient was seen in person.   _____________________________________   Eppie Gibson, MD

## 2021-01-23 NOTE — Progress Notes (Signed)
Oncology Nurse Navigator Documentation   Per patient's 01/20/21 post-treatment follow-up with Dr. Isidore Moos, sent fax to [facility] ENT Scheduling with request Mr. Vicknair be contacted and scheduled for routine post-RT follow-up in six months with Dr. Redmond Baseman.  Notification of successful fax transmission received.   Harlow Asa RN, BSN, OCN Head & Neck Oncology Nurse [place] at [facility] Phone # [phone]  Fax # [phone]

## 2021-09-27 ENCOUNTER — Encounter (HOSPITAL_BASED_OUTPATIENT_CLINIC_OR_DEPARTMENT_OTHER): Payer: Self-pay

## 2021-09-27 ENCOUNTER — Other Ambulatory Visit: Payer: Self-pay

## 2021-09-27 ENCOUNTER — Emergency Department (HOSPITAL_BASED_OUTPATIENT_CLINIC_OR_DEPARTMENT_OTHER): Payer: Medicare HMO

## 2021-09-27 ENCOUNTER — Inpatient Hospital Stay (HOSPITAL_BASED_OUTPATIENT_CLINIC_OR_DEPARTMENT_OTHER)
Admission: EM | Admit: 2021-09-27 | Discharge: 2021-10-23 | DRG: 853 | Disposition: E | Payer: Medicare HMO | Attending: Critical Care Medicine | Admitting: Critical Care Medicine

## 2021-09-27 DIAGNOSIS — K219 Gastro-esophageal reflux disease without esophagitis: Secondary | ICD-10-CM | POA: Diagnosis present

## 2021-09-27 DIAGNOSIS — N136 Pyonephrosis: Secondary | ICD-10-CM | POA: Diagnosis present

## 2021-09-27 DIAGNOSIS — Z801 Family history of malignant neoplasm of trachea, bronchus and lung: Secondary | ICD-10-CM

## 2021-09-27 DIAGNOSIS — N179 Acute kidney failure, unspecified: Secondary | ICD-10-CM

## 2021-09-27 DIAGNOSIS — R778 Other specified abnormalities of plasma proteins: Secondary | ICD-10-CM | POA: Diagnosis present

## 2021-09-27 DIAGNOSIS — Z923 Personal history of irradiation: Secondary | ICD-10-CM

## 2021-09-27 DIAGNOSIS — E872 Acidosis, unspecified: Secondary | ICD-10-CM | POA: Diagnosis present

## 2021-09-27 DIAGNOSIS — A419 Sepsis, unspecified organism: Secondary | ICD-10-CM | POA: Diagnosis present

## 2021-09-27 DIAGNOSIS — A4159 Other Gram-negative sepsis: Principal | ICD-10-CM | POA: Diagnosis present

## 2021-09-27 DIAGNOSIS — Z716 Tobacco abuse counseling: Secondary | ICD-10-CM

## 2021-09-27 DIAGNOSIS — I5023 Acute on chronic systolic (congestive) heart failure: Secondary | ICD-10-CM | POA: Diagnosis present

## 2021-09-27 DIAGNOSIS — I469 Cardiac arrest, cause unspecified: Secondary | ICD-10-CM | POA: Diagnosis not present

## 2021-09-27 DIAGNOSIS — E876 Hypokalemia: Secondary | ICD-10-CM | POA: Diagnosis present

## 2021-09-27 DIAGNOSIS — N1831 Chronic kidney disease, stage 3a: Secondary | ICD-10-CM | POA: Diagnosis present

## 2021-09-27 DIAGNOSIS — E871 Hypo-osmolality and hyponatremia: Secondary | ICD-10-CM | POA: Diagnosis present

## 2021-09-27 DIAGNOSIS — F17210 Nicotine dependence, cigarettes, uncomplicated: Secondary | ICD-10-CM | POA: Diagnosis present

## 2021-09-27 DIAGNOSIS — Z7982 Long term (current) use of aspirin: Secondary | ICD-10-CM

## 2021-09-27 DIAGNOSIS — J690 Pneumonitis due to inhalation of food and vomit: Secondary | ICD-10-CM | POA: Diagnosis present

## 2021-09-27 DIAGNOSIS — R0902 Hypoxemia: Principal | ICD-10-CM

## 2021-09-27 DIAGNOSIS — R7881 Bacteremia: Secondary | ICD-10-CM | POA: Diagnosis present

## 2021-09-27 DIAGNOSIS — E875 Hyperkalemia: Secondary | ICD-10-CM | POA: Diagnosis present

## 2021-09-27 DIAGNOSIS — R6521 Severe sepsis with septic shock: Secondary | ICD-10-CM | POA: Diagnosis present

## 2021-09-27 DIAGNOSIS — D696 Thrombocytopenia, unspecified: Secondary | ICD-10-CM | POA: Diagnosis present

## 2021-09-27 DIAGNOSIS — Z66 Do not resuscitate: Secondary | ICD-10-CM | POA: Diagnosis not present

## 2021-09-27 DIAGNOSIS — E8721 Acute metabolic acidosis: Secondary | ICD-10-CM | POA: Diagnosis not present

## 2021-09-27 DIAGNOSIS — Z85810 Personal history of malignant neoplasm of tongue: Secondary | ICD-10-CM

## 2021-09-27 DIAGNOSIS — R338 Other retention of urine: Secondary | ICD-10-CM | POA: Diagnosis present

## 2021-09-27 DIAGNOSIS — I483 Typical atrial flutter: Secondary | ICD-10-CM | POA: Diagnosis not present

## 2021-09-27 DIAGNOSIS — D6959 Other secondary thrombocytopenia: Secondary | ICD-10-CM | POA: Diagnosis present

## 2021-09-27 DIAGNOSIS — G8929 Other chronic pain: Secondary | ICD-10-CM | POA: Diagnosis present

## 2021-09-27 DIAGNOSIS — I130 Hypertensive heart and chronic kidney disease with heart failure and stage 1 through stage 4 chronic kidney disease, or unspecified chronic kidney disease: Secondary | ICD-10-CM | POA: Diagnosis present

## 2021-09-27 DIAGNOSIS — N200 Calculus of kidney: Secondary | ICD-10-CM

## 2021-09-27 DIAGNOSIS — R34 Anuria and oliguria: Secondary | ICD-10-CM | POA: Diagnosis not present

## 2021-09-27 DIAGNOSIS — C320 Malignant neoplasm of glottis: Secondary | ICD-10-CM | POA: Diagnosis present

## 2021-09-27 DIAGNOSIS — H5702 Anisocoria: Secondary | ICD-10-CM | POA: Diagnosis present

## 2021-09-27 DIAGNOSIS — N1330 Unspecified hydronephrosis: Secondary | ICD-10-CM | POA: Diagnosis present

## 2021-09-27 DIAGNOSIS — Z515 Encounter for palliative care: Secondary | ICD-10-CM

## 2021-09-27 DIAGNOSIS — M96A3 Multiple fractures of ribs associated with chest compression and cardiopulmonary resuscitation: Secondary | ICD-10-CM | POA: Diagnosis not present

## 2021-09-27 DIAGNOSIS — J189 Pneumonia, unspecified organism: Secondary | ICD-10-CM | POA: Diagnosis present

## 2021-09-27 DIAGNOSIS — R7989 Other specified abnormal findings of blood chemistry: Secondary | ICD-10-CM | POA: Diagnosis present

## 2021-09-27 DIAGNOSIS — Z20822 Contact with and (suspected) exposure to covid-19: Secondary | ICD-10-CM | POA: Diagnosis present

## 2021-09-27 DIAGNOSIS — M1990 Unspecified osteoarthritis, unspecified site: Secondary | ICD-10-CM | POA: Diagnosis present

## 2021-09-27 DIAGNOSIS — Z79899 Other long term (current) drug therapy: Secondary | ICD-10-CM

## 2021-09-27 DIAGNOSIS — E785 Hyperlipidemia, unspecified: Secondary | ICD-10-CM | POA: Diagnosis present

## 2021-09-27 DIAGNOSIS — B961 Klebsiella pneumoniae [K. pneumoniae] as the cause of diseases classified elsewhere: Secondary | ICD-10-CM | POA: Diagnosis present

## 2021-09-27 DIAGNOSIS — J9601 Acute respiratory failure with hypoxia: Secondary | ICD-10-CM | POA: Diagnosis present

## 2021-09-27 DIAGNOSIS — Z79891 Long term (current) use of opiate analgesic: Secondary | ICD-10-CM

## 2021-09-27 DIAGNOSIS — N401 Enlarged prostate with lower urinary tract symptoms: Secondary | ICD-10-CM | POA: Diagnosis present

## 2021-09-27 DIAGNOSIS — N170 Acute kidney failure with tubular necrosis: Secondary | ICD-10-CM | POA: Diagnosis present

## 2021-09-27 DIAGNOSIS — R7401 Elevation of levels of liver transaminase levels: Secondary | ICD-10-CM | POA: Diagnosis present

## 2021-09-27 DIAGNOSIS — I4891 Unspecified atrial fibrillation: Secondary | ICD-10-CM | POA: Diagnosis not present

## 2021-09-27 DIAGNOSIS — Z7189 Other specified counseling: Secondary | ICD-10-CM

## 2021-09-27 DIAGNOSIS — M419 Scoliosis, unspecified: Secondary | ICD-10-CM | POA: Diagnosis present

## 2021-09-27 DIAGNOSIS — M545 Low back pain, unspecified: Secondary | ICD-10-CM | POA: Diagnosis present

## 2021-09-27 LAB — URINALYSIS, ROUTINE W REFLEX MICROSCOPIC
Bilirubin Urine: NEGATIVE
Glucose, UA: NEGATIVE mg/dL
Ketones, ur: NEGATIVE mg/dL
Nitrite: NEGATIVE
Protein, ur: >300 mg/dL — AB
RBC / HPF: >50 RBC/hpf — ABNORMAL HIGH (ref 0–5)
Specific Gravity, Urine: 1.021 (ref 1.005–1.030)
pH: 6 (ref 5.0–8.0)

## 2021-09-27 LAB — RESP PANEL BY RT-PCR (FLU A&B, COVID) ARPGX2
Influenza A by PCR: NEGATIVE
Influenza B by PCR: NEGATIVE
SARS Coronavirus 2 by RT PCR: NEGATIVE

## 2021-09-27 LAB — COMPREHENSIVE METABOLIC PANEL
ALT: 64 U/L — ABNORMAL HIGH (ref 0–44)
AST: 147 U/L — ABNORMAL HIGH (ref 15–41)
Albumin: 3.9 g/dL (ref 3.5–5.0)
Alkaline Phosphatase: 122 U/L (ref 38–126)
Anion gap: 18 — ABNORMAL HIGH (ref 5–15)
BUN: 61 mg/dL — ABNORMAL HIGH (ref 8–23)
CO2: 19 mmol/L — ABNORMAL LOW (ref 22–32)
Calcium: 10.9 mg/dL — ABNORMAL HIGH (ref 8.9–10.3)
Chloride: 90 mmol/L — ABNORMAL LOW (ref 98–111)
Creatinine, Ser: 4.43 mg/dL — ABNORMAL HIGH (ref 0.61–1.24)
GFR, Estimated: 13 mL/min — ABNORMAL LOW (ref >60)
Glucose, Bld: 77 mg/dL (ref 70–99)
Potassium: 5.9 mmol/L — ABNORMAL HIGH (ref 3.5–5.1)
Sodium: 127 mmol/L — ABNORMAL LOW (ref 135–145)
Total Bilirubin: 1.5 mg/dL — ABNORMAL HIGH (ref 0.3–1.2)
Total Protein: 7.6 g/dL (ref 6.5–8.1)

## 2021-09-27 LAB — CBC WITH DIFFERENTIAL/PLATELET
Abs Immature Granulocytes: 1.6 10*3/uL — ABNORMAL HIGH (ref 0.00–0.07)
Band Neutrophils: 16 %
Basophils Absolute: 0 10*3/uL (ref 0.0–0.1)
Basophils Relative: 0 %
Eosinophils Absolute: 0 10*3/uL (ref 0.0–0.5)
Eosinophils Relative: 0 %
HCT: 42.1 % (ref 39.0–52.0)
Hemoglobin: 14.4 g/dL (ref 13.0–17.0)
Lymphocytes Relative: 8 %
Lymphs Abs: 0.6 10*3/uL — ABNORMAL LOW (ref 0.7–4.0)
MCH: 28.3 pg (ref 26.0–34.0)
MCHC: 34.2 g/dL (ref 30.0–36.0)
MCV: 82.9 fL (ref 80.0–100.0)
Metamyelocytes Relative: 6 %
Monocytes Absolute: 0.3 10*3/uL (ref 0.1–1.0)
Monocytes Relative: 4 %
Myelocytes: 14 %
Neutro Abs: 5.4 10*3/uL (ref 1.7–7.7)
Neutrophils Relative %: 52 %
Platelets: 63 10*3/uL — ABNORMAL LOW (ref 150–400)
RBC: 5.08 MIL/uL (ref 4.22–5.81)
RDW: 14 % (ref 11.5–15.5)
Smear Review: DECREASED
WBC: 8 10*3/uL (ref 4.0–10.5)
nRBC: 0.5 % — ABNORMAL HIGH (ref 0.0–0.2)

## 2021-09-27 LAB — I-STAT ARTERIAL BLOOD GAS, ED
Acid-base deficit: 5 mmol/L — ABNORMAL HIGH (ref 0.0–2.0)
Bicarbonate: 19.1 mmol/L — ABNORMAL LOW (ref 20.0–28.0)
Calcium, Ion: 1.2 mmol/L (ref 1.15–1.40)
HCT: 34 % — ABNORMAL LOW (ref 39.0–52.0)
Hemoglobin: 11.6 g/dL — ABNORMAL LOW (ref 13.0–17.0)
O2 Saturation: 91 %
Patient temperature: 98.4
Potassium: 5.7 mmol/L — ABNORMAL HIGH (ref 3.5–5.1)
Sodium: 128 mmol/L — ABNORMAL LOW (ref 135–145)
TCO2: 20 mmol/L — ABNORMAL LOW (ref 22–32)
pCO2 arterial: 33.2 mmHg (ref 32–48)
pH, Arterial: 7.369 (ref 7.35–7.45)
pO2, Arterial: 63 mmHg — ABNORMAL LOW (ref 83–108)

## 2021-09-27 LAB — PROTIME-INR
INR: 1.2 (ref 0.8–1.2)
Prothrombin Time: 15.5 seconds — ABNORMAL HIGH (ref 11.4–15.2)

## 2021-09-27 LAB — CK: Total CK: 400 U/L — ABNORMAL HIGH (ref 49–397)

## 2021-09-27 LAB — BRAIN NATRIURETIC PEPTIDE: B Natriuretic Peptide: 272.7 pg/mL — ABNORMAL HIGH (ref 0.0–100.0)

## 2021-09-27 LAB — LACTIC ACID, PLASMA
Lactic Acid, Venous: 6.7 mmol/L (ref 0.5–1.9)
Lactic Acid, Venous: 4.9 mmol/L (ref 0.5–1.9)

## 2021-09-27 MED ORDER — IPRATROPIUM-ALBUTEROL 0.5-2.5 (3) MG/3ML IN SOLN
3.0000 mL | Freq: Once | RESPIRATORY_TRACT | Status: AC
  Administered 2021-09-27: 3 mL via RESPIRATORY_TRACT
  Filled 2021-09-27: qty 3

## 2021-09-27 MED ORDER — SODIUM CHLORIDE 0.9 % IV SOLN
500.0000 mg | Freq: Once | INTRAVENOUS | Status: AC
  Administered 2021-09-27: 500 mg via INTRAVENOUS
  Filled 2021-09-27: qty 5

## 2021-09-27 MED ORDER — SODIUM CHLORIDE 0.9 % IV BOLUS
1000.0000 mL | Freq: Once | INTRAVENOUS | Status: AC
  Administered 2021-09-27: 1000 mL via INTRAVENOUS

## 2021-09-27 MED ORDER — SODIUM ZIRCONIUM CYCLOSILICATE 10 G PO PACK
10.0000 g | PACK | Freq: Once | ORAL | Status: AC
  Administered 2021-09-27: 10 g via ORAL
  Filled 2021-09-27: qty 1

## 2021-09-27 MED ORDER — ALBUTEROL SULFATE (2.5 MG/3ML) 0.083% IN NEBU
INHALATION_SOLUTION | RESPIRATORY_TRACT | Status: AC
  Filled 2021-09-27: qty 3

## 2021-09-27 MED ORDER — SODIUM CHLORIDE 0.9 % IV SOLN
1.0000 g | Freq: Once | INTRAVENOUS | Status: AC
  Administered 2021-09-27: 1 g via INTRAVENOUS
  Filled 2021-09-27: qty 10

## 2021-09-27 MED ORDER — SODIUM CHLORIDE 0.9 % IV BOLUS
500.0000 mL | Freq: Once | INTRAVENOUS | Status: AC
  Administered 2021-09-27: 500 mL via INTRAVENOUS

## 2021-09-27 NOTE — ED Notes (Signed)
RT educated pt and family on the importance of smoking cessation for the pt. Pt currently on 2 Lpm [place] w/sats ranging from 88%-92%. Pt presents w/barrel chest and SOB. RT discussed pt obtaining a referral to Pulmonologist for a PFT for further evaluation of his symptoms. RT gave pt list of local pulmonologist if he wishes to do so. RT will continue to monitor. ?

## 2021-09-27 NOTE — ED Notes (Signed)
Bladder Scan: 70 mL ?

## 2021-09-27 NOTE — ED Notes (Signed)
RT Patrick obtained ABG, this RT processed ABG on ISTAT w/the following results. MD notified of all results including any criticals.  ? ? Latest Reference Range & Units Most Recent  ?Sample type  ARTERIAL ?10/07/2021 19:11  ?pH, Arterial 7.35 - 7.45  7.369 ?09/26/2021 19:11  ?pCO2 arterial 32 - 48 mmHg 33.2 ?10/10/2021 19:11  ?pO2, Arterial 83 - 108 mmHg 63 (L) ?10/17/2021 19:11  ?TCO2 22 - 32 mmol/L 20 (L) ?09/30/2021 19:11  ?Acid-base deficit 0.0 - 2.0 mmol/L 5.0 (H) ?10/10/2021 19:11  ?Bicarbonate 20.0 - 28.0 mmol/L 19.1 (L) ?10/07/2021 19:11  ?O2 Saturation % 91 ?09/30/2021 19:11  ?Patient temperature  98.4 F ?10/10/2021 19:11  ?Collection site  RADIAL, ALLEN'S TEST ACCEPTABLE ?10/02/2021 19:11  ? ?

## 2021-09-27 NOTE — ED Provider Notes (Signed)
?[facility] EMERG[facility] DEPT ?Provider Note ? ? ?CSN: [id number] ?Arrival date & time: 10/09/2021  1735 ? ?  ? ?History ? ?Chief Complaint  ?Patient presents with  ? Weakness  ? ? ?Aaron Klein is a 76 y.o. male. ? ?Patient presents to ER chief complaint of generalized fatigue generalized weakness.  He denies any pain otherwise.  Denies fevers or cough or vomiting or diarrhea.  Just states that he does not "feel like himself."  He has a history of tongue cancer in remission.  Otherwise continues to smoke. ? ? ?  ? ?Home Medications ?Prior to Admission medications   ?Medication Sig Start Date End Date Taking? Authorizing Provider  ?Ascorbic Acid (VITAMIN C) 1000 MG tablet Take 1,000 mg by mouth daily.    [provider]  ?aspirin EC 81 MG tablet Take 81 mg by mouth daily.    [provider]  ?atorvastatin (LIPITOR) 10 MG tablet Take 10 mg by mouth daily.     [provider]  ?celecoxib (CELEBREX) 200 MG capsule Take 200 mg by mouth daily.  03/02/19   [provider]  ?Cholecalciferol (VITAMIN D3) 1000 units CAPS Take 1,000 Units by mouth daily.     [provider]  ?finasteride (PROSCAR) 5 MG tablet Take 5 mg by mouth daily.    [provider]  ?Flaxseed, Linseed, (FLAXSEED OIL) 1200 MG CAPS Take 2,400 mg by mouth daily.     [provider]  ?gabapentin (NEURONTIN) 100 MG capsule Take 100 mg by mouth 3 (three) times daily.    [provider]  ?HYDROcodone-acetaminophen (NORCO) 10-325 MG per tablet Take 1 tablet by mouth every 6 (six) hours as needed for moderate pain.     [provider]  ?Lecithin 1200 MG CAPS Take 2,400 mg by mouth daily.     [provider]  ?Multiple Vitamin (MULTIVITAMIN) tablet Take 1 tablet by mouth daily.    [provider]  ?Multiple Vitamins-Minerals (HEALTHY EYES PO) Take 1 capsule by mouth daily.    [provider]  ?Omega-3 Fatty Acids (FISH OIL PO) Take 2,000 mg by mouth  daily.    [provider]  ?pantoprazole (PROTONIX) 40 MG tablet Take 40 mg by mouth daily.     [provider]  ?tamsulosin (FLOMAX) 0.4 MG CAPS capsule Take 1 capsule (0.4 mg total) by mouth daily. 12/09/19   Lurline Del, DO  ?traMADol (ULTRAM) 50 MG tablet Take 50 mg by mouth 2 (two) times daily.    [provider]  ?vitamin E 400 UNIT capsule Take 400 Units by mouth daily.     [provider]  ?   ? ?Allergies    ?Patient has no known allergies.   ? ?Review of Systems   ?Review of Systems  ?Constitutional:  Negative for fever.  ?HENT:  Negative for ear pain and sore throat.   ?Eyes:  Negative for pain.  ?Respiratory:  Negative for cough.   ?Cardiovascular:  Negative for chest pain.  ?Gastrointestinal:  Negative for abdominal pain.  ?Genitourinary:  Negative for flank pain.  ?Musculoskeletal:  Negative for back pain.  ?Skin:  Negative for color change and rash.  ?Neurological:  Negative for syncope.  ?All other systems reviewed and are negative. ? ?Physical Exam ?Updated Vital Signs ?BP 115/72   Pulse 96   Temp 98.3 ?F (36.8 ?C) (Oral)   Resp (!) 21   Ht '5\' 10"'$  (1.778 m)   Wt 76.3 kg  SpO2 92%   BMI 24.14 kg/m?  ?Physical Exam ?Constitutional:   ?   Appearance: He is well-developed.  ?HENT:  ?   Head: Normocephalic.  ?   Nose: Nose normal.  ?Eyes:  ?   Extraocular Movements: Extraocular movements intact.  ?Cardiovascular:  ?   Rate and Rhythm: Normal rate.  ?Pulmonary:  ?   Breath sounds: No rhonchi or rales.  ?Abdominal:  ?   Tenderness: There is no abdominal tenderness. There is no guarding or rebound.  ?Skin: ?   Coloration: Skin is not jaundiced.  ?Neurological:  ?   General: No focal deficit present.  ?   Mental Status: He is alert and oriented to person, place, and time. Mental status is at baseline.  ?   Cranial Nerves: No cranial nerve deficit.  ?   Motor: No weakness.  ? ? ?ED Results / Procedures / Treatments   ?Labs ?(all labs ordered are listed, but only  abnormal results are displayed) ?Labs Reviewed  ?COMPREHENSIVE METABOLIC PANEL - Abnormal; Notable for the following components:  ?    Result Value  ? Sodium 127 (*)   ? Potassium 5.9 (*)   ? Chloride 90 (*)   ? CO2 19 (*)   ? BUN 61 (*)   ? Creatinine, Ser 4.43 (*)   ? Calcium 10.9 (*)   ? AST 147 (*)   ? ALT 64 (*)   ? Total Bilirubin 1.5 (*)   ? GFR, Estimated 13 (*)   ? Anion gap 18 (*)   ? All other components within normal limits  ?LACTIC ACID, PLASMA - Abnormal; Notable for the following components:  ? Lactic Acid, Venous 6.7 (*)   ? All other components within normal limits  ?LACTIC ACID, PLASMA - Abnormal; Notable for the following components:  ? Lactic Acid, Venous 4.9 (*)   ? All other components within normal limits  ?CBC WITH DIFFERENTIAL/PLATELET - Abnormal; Notable for the following components:  ? Platelets 63 (*)   ? nRBC 0.5 (*)   ? Lymphs Abs 0.6 (*)   ? Abs Immature Granulocytes 1.60 (*)   ? All other components within normal limits  ?PROTIME-INR - Abnormal; Notable for the following components:  ? Prothrombin Time 15.5 (*)   ? All other components within normal limits  ?BRAIN NATRIURETIC PEPTIDE - Abnormal; Notable for the following components:  ? B Natriuretic Peptide 272.7 (*)   ? All other components within normal limits  ?I-STAT ARTERIAL BLOOD GAS, ED - Abnormal; Notable for the following components:  ? pO2, Arterial 63 (*)   ? Bicarbonate 19.1 (*)   ? TCO2 20 (*)   ? Acid-base deficit 5.0 (*)   ? Sodium 128 (*)   ? Potassium 5.7 (*)   ? HCT 34.0 (*)   ? Hemoglobin 11.6 (*)   ? All other components within normal limits  ?RESP PANEL BY RT-PCR (FLU A&B, COVID) ARPGX2  ?CULTURE, BLOOD (ROUTINE X 2)  ?CULTURE, BLOOD (ROUTINE X 2)  ?URINALYSIS, ROUTINE W REFLEX MICROSCOPIC  ?BLOOD GAS, ARTERIAL  ? ? ?EKG ?EKG Interpretation ? ?Date/Time:  Wednesday September 27 2021 17:43:07 EDT ?Ventricular Rate:  114 ?PR Interval:  182 ?QRS Duration: 91 ?QT Interval:  276 ?QTC Calculation: 380 ?R Axis:   80 ?Text  Interpretation: Sinus tachycardia Multiple ventricular premature complexes Anterior infarct, old Confirmed by Thamas Jaegers ([id number]) on 10/14/2021 6:04:04 PM ? ?Radiology ?DG Chest Portable 1 View ? ?Result Date: 10/09/2021 ?CLINICAL DATA:  Weakness.  Fever yesterday. EXAM: PORTABLE CHEST 1 VIEW COMPARISON:  Radiograph 12/06/2019 FINDINGS: Lung volumes are low limiting assessment. Ill-defined and streaky opacity in the right upper lung zone. Heart size grossly stable. There is a large hiatal hernia that is suboptimally assessed on this portable exam. No pleural effusion, pneumothorax, or pulmonary edema. Scoliosis. IMPRESSION: 1. Low lung volumes with streaky opacity in the right upper lung zone, suspicious for pneumonia. 2. Large hiatal hernia, suboptimally assessed on this portable exam. Electronically Signed   By: Keith Rake M.D.   On: 10/05/2021 18:25   ? ?Procedures ?Marland KitchenCritical Care ?Performed by: Luna Fuse, MD ?Authorized by: Luna Fuse, MD  ? ?Critical care provider statement:  ?  Critical care time (minutes):  40 ?  Critical care time was exclusive of:  Separately billable procedures and treating other patients and teaching time ?  Critical care was necessary to treat or prevent imminent or life-threatening deterioration of the following conditions:  Respiratory failure, renal failure, dehydration and metabolic crisis  ? ? ?Medications Ordered in ED ?Medications  ?sodium chloride 0.9 % bolus 1,000 mL (0 mLs Intravenous Stopped 10/12/2021 2019)  ?cefTRIAXone (ROCEPHIN) 1 g in sodium chloride 0.9 % 100 mL IVPB (0 g Intravenous Stopped 09/29/2021 1921)  ?azithromycin (ZITHROMAX) 500 mg in sodium chloride 0.9 % 250 mL IVPB (0 mg Intravenous Stopped 10/06/2021 1951)  ?ipratropium-albuterol (DUONEB) 0.5-2.5 (3) MG/3ML nebulizer solution 3 mL (3 mLs Nebulization Given 10/11/2021 1905)  ?sodium chloride 0.9 % bolus 1,000 mL (0 mLs Intravenous Stopped 10/01/2021 1921)  ?sodium chloride 0.9 % bolus 500 mL (0 mLs Intravenous  Stopped 10/20/2021 2000)  ?sodium zirconium cyclosilicate (LOKELMA) packet 10 g (10 g Oral Given 10/22/2021 2106)  ? ? ?ED Course/ Medical Decision Making/ A&P ?  ?                        ?Medical Decision Making ?Amount and/o

## 2021-09-27 NOTE — ED Notes (Signed)
RT Note: Humidifier placed on pts. Oxygen, remains at 4 lpm, MD made aware, Sats 91-92%. ?

## 2021-09-27 NOTE — ED Notes (Signed)
Care Handoff/Patient Report given to Summer, Therapist, sports at [facility]. All Questions answered. ?

## 2021-09-27 NOTE — ED Notes (Signed)
27 French Foley Catheter inserted by this RN with assistance from Shann Medal., RN at this Time. Order placed by Almyra Free, MD. Patient tolerated Procedure well and secured with Secure Lock on Right Leg.  ?

## 2021-09-27 NOTE — ED Notes (Signed)
XRAY at the Bedside. ?

## 2021-09-27 NOTE — ED Notes (Signed)
MD at the Bedside. 

## 2021-09-27 NOTE — ED Notes (Signed)
[facility] at bedside 

## 2021-09-27 NOTE — ED Notes (Signed)
Lab to add on CK (verified with lab tech [place] via telephone)  ?

## 2021-09-27 NOTE — ED Notes (Signed)
Called [facility] to transport the patient to [facility] 6E room# 10 ?

## 2021-09-27 NOTE — ED Triage Notes (Signed)
Patient here POV from Home with Weakness. ? ?Endorses feeling "ill" since Monday. Worsening since. Endorses Generalized Body Aches and Fever as well as Weakness. ? ?102 Temp at Home yesterday. No N/V/D.  ? ?NAD Noted during Triage. A&Ox4. GCS 15. Ambulatory  ?

## 2021-09-28 ENCOUNTER — Encounter (HOSPITAL_COMMUNITY): Admission: EM | Disposition: E | Payer: Self-pay | Source: Home / Self Care | Attending: Internal Medicine

## 2021-09-28 ENCOUNTER — Inpatient Hospital Stay (HOSPITAL_COMMUNITY): Payer: Medicare HMO | Admitting: Anesthesiology

## 2021-09-28 ENCOUNTER — Encounter (HOSPITAL_COMMUNITY): Payer: Self-pay | Admitting: Internal Medicine

## 2021-09-28 ENCOUNTER — Inpatient Hospital Stay (HOSPITAL_COMMUNITY): Payer: Medicare HMO

## 2021-09-28 ENCOUNTER — Other Ambulatory Visit: Payer: Self-pay | Admitting: Urology

## 2021-09-28 DIAGNOSIS — R0902 Hypoxemia: Secondary | ICD-10-CM | POA: Diagnosis present

## 2021-09-28 DIAGNOSIS — J189 Pneumonia, unspecified organism: Secondary | ICD-10-CM | POA: Diagnosis present

## 2021-09-28 DIAGNOSIS — N170 Acute kidney failure with tubular necrosis: Secondary | ICD-10-CM | POA: Diagnosis present

## 2021-09-28 DIAGNOSIS — J690 Pneumonitis due to inhalation of food and vomit: Secondary | ICD-10-CM | POA: Diagnosis present

## 2021-09-28 DIAGNOSIS — I469 Cardiac arrest, cause unspecified: Secondary | ICD-10-CM | POA: Diagnosis not present

## 2021-09-28 DIAGNOSIS — Z515 Encounter for palliative care: Secondary | ICD-10-CM | POA: Diagnosis not present

## 2021-09-28 DIAGNOSIS — G8929 Other chronic pain: Secondary | ICD-10-CM | POA: Diagnosis present

## 2021-09-28 DIAGNOSIS — N1831 Chronic kidney disease, stage 3a: Secondary | ICD-10-CM | POA: Diagnosis present

## 2021-09-28 DIAGNOSIS — E871 Hypo-osmolality and hyponatremia: Secondary | ICD-10-CM | POA: Diagnosis present

## 2021-09-28 DIAGNOSIS — M1990 Unspecified osteoarthritis, unspecified site: Secondary | ICD-10-CM

## 2021-09-28 DIAGNOSIS — N136 Pyonephrosis: Secondary | ICD-10-CM | POA: Diagnosis present

## 2021-09-28 DIAGNOSIS — R7989 Other specified abnormal findings of blood chemistry: Secondary | ICD-10-CM | POA: Diagnosis present

## 2021-09-28 DIAGNOSIS — N179 Acute kidney failure, unspecified: Secondary | ICD-10-CM

## 2021-09-28 DIAGNOSIS — R6521 Severe sepsis with septic shock: Secondary | ICD-10-CM | POA: Diagnosis present

## 2021-09-28 DIAGNOSIS — Z7189 Other specified counseling: Secondary | ICD-10-CM | POA: Diagnosis not present

## 2021-09-28 DIAGNOSIS — I5023 Acute on chronic systolic (congestive) heart failure: Secondary | ICD-10-CM | POA: Diagnosis present

## 2021-09-28 DIAGNOSIS — F17210 Nicotine dependence, cigarettes, uncomplicated: Secondary | ICD-10-CM | POA: Diagnosis present

## 2021-09-28 DIAGNOSIS — N401 Enlarged prostate with lower urinary tract symptoms: Secondary | ICD-10-CM | POA: Diagnosis present

## 2021-09-28 DIAGNOSIS — A419 Sepsis, unspecified organism: Secondary | ICD-10-CM

## 2021-09-28 DIAGNOSIS — Z20822 Contact with and (suspected) exposure to covid-19: Secondary | ICD-10-CM | POA: Diagnosis present

## 2021-09-28 DIAGNOSIS — E8721 Acute metabolic acidosis: Secondary | ICD-10-CM | POA: Diagnosis not present

## 2021-09-28 DIAGNOSIS — I130 Hypertensive heart and chronic kidney disease with heart failure and stage 1 through stage 4 chronic kidney disease, or unspecified chronic kidney disease: Secondary | ICD-10-CM | POA: Diagnosis present

## 2021-09-28 DIAGNOSIS — M419 Scoliosis, unspecified: Secondary | ICD-10-CM | POA: Diagnosis present

## 2021-09-28 DIAGNOSIS — M96A3 Multiple fractures of ribs associated with chest compression and cardiopulmonary resuscitation: Secondary | ICD-10-CM | POA: Diagnosis not present

## 2021-09-28 DIAGNOSIS — I483 Typical atrial flutter: Secondary | ICD-10-CM | POA: Diagnosis not present

## 2021-09-28 DIAGNOSIS — D6959 Other secondary thrombocytopenia: Secondary | ICD-10-CM | POA: Diagnosis present

## 2021-09-28 DIAGNOSIS — R0602 Shortness of breath: Secondary | ICD-10-CM | POA: Diagnosis not present

## 2021-09-28 DIAGNOSIS — J9601 Acute respiratory failure with hypoxia: Secondary | ICD-10-CM | POA: Diagnosis present

## 2021-09-28 DIAGNOSIS — D696 Thrombocytopenia, unspecified: Secondary | ICD-10-CM | POA: Diagnosis present

## 2021-09-28 DIAGNOSIS — R6520 Severe sepsis without septic shock: Secondary | ICD-10-CM

## 2021-09-28 DIAGNOSIS — Z66 Do not resuscitate: Secondary | ICD-10-CM | POA: Diagnosis not present

## 2021-09-28 DIAGNOSIS — A4159 Other Gram-negative sepsis: Secondary | ICD-10-CM | POA: Diagnosis present

## 2021-09-28 HISTORY — PX: CYSTOSCOPY W/ URETERAL STENT PLACEMENT: SHX1429

## 2021-09-28 LAB — ABO/RH: ABO/RH(D): B POS

## 2021-09-28 LAB — CBC WITH DIFFERENTIAL/PLATELET
Abs Immature Granulocytes: 0.68 10*3/uL — ABNORMAL HIGH (ref 0.00–0.07)
Basophils Absolute: 0.1 10*3/uL (ref 0.0–0.1)
Basophils Relative: 1 %
Eosinophils Absolute: 0 10*3/uL (ref 0.0–0.5)
Eosinophils Relative: 0 %
HCT: 34.8 % — ABNORMAL LOW (ref 39.0–52.0)
Hemoglobin: 12.1 g/dL — ABNORMAL LOW (ref 13.0–17.0)
Immature Granulocytes: 11 %
Lymphocytes Relative: 6 %
Lymphs Abs: 0.4 10*3/uL — ABNORMAL LOW (ref 0.7–4.0)
MCH: 28.9 pg (ref 26.0–34.0)
MCHC: 34.8 g/dL (ref 30.0–36.0)
MCV: 83.3 fL (ref 80.0–100.0)
Monocytes Absolute: 0.4 10*3/uL (ref 0.1–1.0)
Monocytes Relative: 7 %
Neutro Abs: 4.6 10*3/uL (ref 1.7–7.7)
Neutrophils Relative %: 75 %
Platelets: 46 10*3/uL — ABNORMAL LOW (ref 150–400)
RBC: 4.18 MIL/uL — ABNORMAL LOW (ref 4.22–5.81)
RDW: 14 % (ref 11.5–15.5)
Smear Review: DECREASED
WBC: 6.2 10*3/uL (ref 4.0–10.5)
nRBC: 0.3 % — ABNORMAL HIGH (ref 0.0–0.2)

## 2021-09-28 LAB — DIC (DISSEMINATED INTRAVASCULAR COAGULATION)PANEL
D-Dimer, Quant: >20 ug/mL-FEU — ABNORMAL HIGH (ref 0.00–0.50)
Fibrinogen: 775 mg/dL — ABNORMAL HIGH (ref 210–475)
INR: 1.4 — ABNORMAL HIGH (ref 0.8–1.2)
Platelets: 37 10*3/uL — ABNORMAL LOW (ref 150–400)
Prothrombin Time: 16.7 seconds — ABNORMAL HIGH (ref 11.4–15.2)
Smear Review: NONE SEEN
aPTT: 32 seconds (ref 24–36)

## 2021-09-28 LAB — BLOOD CULTURE ID PANEL (REFLEXED) - BCID2

## 2021-09-28 LAB — RETICULOCYTES
Immature Retic Fract: 23.9 % — ABNORMAL HIGH (ref 2.3–15.9)
RBC.: 4 MIL/uL — ABNORMAL LOW (ref 4.22–5.81)
Retic Count, Absolute: 28.8 10*3/uL (ref 19.0–186.0)
Retic Ct Pct: 0.7 % (ref 0.4–3.1)

## 2021-09-28 LAB — TROPONIN I (HIGH SENSITIVITY)
Troponin I (High Sensitivity): 298 ng/L (ref <18)
Troponin I (High Sensitivity): 331 ng/L (ref <18)

## 2021-09-28 LAB — BASIC METABOLIC PANEL
Anion gap: 15 (ref 5–15)
Anion gap: 15 (ref 5–15)
BUN: 62 mg/dL — ABNORMAL HIGH (ref 8–23)
BUN: 63 mg/dL — ABNORMAL HIGH (ref 8–23)
CO2: 17 mmol/L — ABNORMAL LOW (ref 22–32)
CO2: 18 mmol/L — ABNORMAL LOW (ref 22–32)
Calcium: 8.9 mg/dL (ref 8.9–10.3)
Calcium: 9.3 mg/dL (ref 8.9–10.3)
Chloride: 97 mmol/L — ABNORMAL LOW (ref 98–111)
Chloride: 98 mmol/L (ref 98–111)
Creatinine, Ser: 4.27 mg/dL — ABNORMAL HIGH (ref 0.61–1.24)
Creatinine, Ser: 4.08 mg/dL — ABNORMAL HIGH (ref 0.61–1.24)
GFR, Estimated: 14 mL/min — ABNORMAL LOW (ref >60)
GFR, Estimated: 14 mL/min — ABNORMAL LOW (ref >60)
Glucose, Bld: 62 mg/dL — ABNORMAL LOW (ref 70–99)
Glucose, Bld: 65 mg/dL — ABNORMAL LOW (ref 70–99)
Potassium: 6.1 mmol/L — ABNORMAL HIGH (ref 3.5–5.1)
Potassium: 5.5 mmol/L — ABNORMAL HIGH (ref 3.5–5.1)
Sodium: 129 mmol/L — ABNORMAL LOW (ref 135–145)
Sodium: 131 mmol/L — ABNORMAL LOW (ref 135–145)

## 2021-09-28 LAB — HEPATIC FUNCTION PANEL
ALT: 58 U/L — ABNORMAL HIGH (ref 0–44)
AST: 135 U/L — ABNORMAL HIGH (ref 15–41)
Albumin: 2.5 g/dL — ABNORMAL LOW (ref 3.5–5.0)
Alkaline Phosphatase: 93 U/L (ref 38–126)
Bilirubin, Direct: 0.5 mg/dL — ABNORMAL HIGH (ref 0.0–0.2)
Indirect Bilirubin: 0.7 mg/dL (ref 0.3–0.9)
Total Bilirubin: 1.2 mg/dL (ref 0.3–1.2)
Total Protein: 6 g/dL — ABNORMAL LOW (ref 6.5–8.1)

## 2021-09-28 LAB — HEPATITIS PANEL, ACUTE
HCV Ab: NONREACTIVE
Hep A IgM: NONREACTIVE
Hep B C IgM: NONREACTIVE
Hepatitis B Surface Ag: NONREACTIVE

## 2021-09-28 LAB — LACTIC ACID, PLASMA
Lactic Acid, Venous: 3.7 mmol/L (ref 0.5–1.9)
Lactic Acid, Venous: 3.9 mmol/L (ref 0.5–1.9)

## 2021-09-28 LAB — TYPE AND SCREEN
ABO/RH(D): B POS
Antibody Screen: NEGATIVE

## 2021-09-28 LAB — TECHNOLOGIST SMEAR REVIEW

## 2021-09-28 LAB — LACTATE DEHYDROGENASE: LDH: 1146 U/L — ABNORMAL HIGH (ref 98–192)

## 2021-09-28 LAB — CK: Total CK: 247 U/L (ref 49–397)

## 2021-09-28 LAB — APTT: aPTT: 32 seconds (ref 24–36)

## 2021-09-28 LAB — PROCALCITONIN: Procalcitonin: >150 ng/mL

## 2021-09-28 SURGERY — CYSTOSCOPY, WITH RETROGRADE PYELOGRAM AND URETERAL STENT INSERTION
Anesthesia: General | Laterality: Right

## 2021-09-28 MED ORDER — PANTOPRAZOLE SODIUM 40 MG PO TBEC
40.0000 mg | DELAYED_RELEASE_TABLET | Freq: Every day | ORAL | Status: DC
Start: 2021-09-28 — End: 2021-10-02
  Administered 2021-09-28 – 2021-10-01 (×4): 40 mg via ORAL
  Filled 2021-09-28 (×4): qty 1

## 2021-09-28 MED ORDER — DEXTROSE 50 % IV SOLN
12.5000 g | Freq: Once | INTRAVENOUS | Status: AC
  Administered 2021-09-28: 12.5 g via INTRAVENOUS
  Filled 2021-09-28: qty 50

## 2021-09-28 MED ORDER — TAMSULOSIN HCL 0.4 MG PO CAPS
0.4000 mg | ORAL_CAPSULE | Freq: Every day | ORAL | Status: DC
Start: 2021-09-28 — End: 2021-10-02
  Administered 2021-09-28 – 2021-10-01 (×4): 0.4 mg via ORAL
  Filled 2021-09-28 (×4): qty 1

## 2021-09-28 MED ORDER — INSULIN ASPART 100 UNIT/ML IJ SOLN
5.0000 [IU] | Freq: Once | INTRAMUSCULAR | Status: AC
  Administered 2021-09-28: 5 [IU] via SUBCUTANEOUS

## 2021-09-28 MED ORDER — ROCURONIUM BROMIDE 10 MG/ML (PF) SYRINGE
PREFILLED_SYRINGE | INTRAVENOUS | Status: DC | PRN
Start: 2021-09-28 — End: 2021-09-28
  Administered 2021-09-28: 40 mg via INTRAVENOUS

## 2021-09-28 MED ORDER — SODIUM CHLORIDE 0.9 % IV SOLN: INTRAVENOUS | Status: DC

## 2021-09-28 MED ORDER — PROPOFOL 10 MG/ML IV BOLUS
INTRAVENOUS | Status: DC | PRN
  Administered 2021-09-28: 70 mg via INTRAVENOUS

## 2021-09-28 MED ORDER — SODIUM ZIRCONIUM CYCLOSILICATE 10 G PO PACK: 10.0000 g | PACK | Freq: Once | ORAL | Status: DC

## 2021-09-28 MED ORDER — SODIUM CHLORIDE 0.9 % IV SOLN
500.0000 mg | INTRAVENOUS | Status: DC
  Filled 2021-09-28: qty 5

## 2021-09-28 MED ORDER — SODIUM CHLORIDE 0.9 % IV SOLN
2.0000 g | INTRAVENOUS | Status: DC
  Administered 2021-09-28 – 2021-10-01 (×4): 2 g via INTRAVENOUS
  Filled 2021-09-28 (×5): qty 20

## 2021-09-28 MED ORDER — FENTANYL CITRATE (PF) 250 MCG/5ML IJ SOLN
INTRAMUSCULAR | Status: DC | PRN
  Administered 2021-09-28 (×2): 50 ug via INTRAVENOUS

## 2021-09-28 MED ORDER — LIDOCAINE HCL URETHRAL/MUCOSAL 2 % EX GEL
CUTANEOUS | Status: AC
  Filled 2021-09-28: qty 11

## 2021-09-28 MED ORDER — METOPROLOL TARTRATE 5 MG/5ML IV SOLN
2.5000 mg | INTRAVENOUS | Status: AC
  Administered 2021-09-28: 2.5 mg via INTRAVENOUS
  Filled 2021-09-28: qty 5

## 2021-09-28 MED ORDER — FENTANYL CITRATE (PF) 100 MCG/2ML IJ SOLN: 25.0000 ug | INTRAMUSCULAR | Status: DC | PRN

## 2021-09-28 MED ORDER — CHLORHEXIDINE GLUCONATE 0.12 % MT SOLN
15.0000 mL | Freq: Once | OROMUCOSAL | Status: AC
  Administered 2021-09-28: 15 mL via OROMUCOSAL

## 2021-09-28 MED ORDER — CHLORHEXIDINE GLUCONATE CLOTH 2 % EX PADS
6.0000 | MEDICATED_PAD | Freq: Every day | CUTANEOUS | Status: DC
  Administered 2021-09-28 – 2021-10-01 (×4): 6 via TOPICAL

## 2021-09-28 MED ORDER — FINASTERIDE 5 MG PO TABS
5.0000 mg | ORAL_TABLET | Freq: Every day | ORAL | Status: DC
  Administered 2021-09-28 – 2021-10-01 (×4): 5 mg via ORAL
  Filled 2021-09-28 (×4): qty 1

## 2021-09-28 MED ORDER — LIDOCAINE 2% (20 MG/ML) 5 ML SYRINGE
INTRAMUSCULAR | Status: DC | PRN
  Administered 2021-09-28: 40 mg via INTRAVENOUS

## 2021-09-28 MED ORDER — FENTANYL CITRATE (PF) 250 MCG/5ML IJ SOLN
INTRAMUSCULAR | Status: AC
  Filled 2021-09-28: qty 5

## 2021-09-28 MED ORDER — STERILE WATER FOR IRRIGATION IR SOLN
Status: DC | PRN
Start: 2021-09-28 — End: 2021-09-28
  Administered 2021-09-28: 1000 mL
  Administered 2021-09-28: 3000 mL

## 2021-09-28 MED ORDER — ORAL CARE MOUTH RINSE: 15.0000 mL | Freq: Once | OROMUCOSAL | Status: AC

## 2021-09-28 MED ORDER — SODIUM BICARBONATE 8.4 % IV SOLN
50.0000 meq | Freq: Once | INTRAVENOUS | Status: AC
  Administered 2021-09-28: 50 meq via INTRAVENOUS
  Filled 2021-09-28: qty 50

## 2021-09-28 MED ORDER — CALCIUM GLUCONATE-NACL 1-0.675 GM/50ML-% IV SOLN
1.0000 g | Freq: Once | INTRAVENOUS | Status: AC
Start: 2021-09-28 — End: 2021-09-28
  Administered 2021-09-28: 1000 mg via INTRAVENOUS
  Filled 2021-09-28: qty 50

## 2021-09-28 MED ORDER — ONDANSETRON HCL 4 MG/2ML IJ SOLN
INTRAMUSCULAR | Status: DC | PRN
  Administered 2021-09-28: 4 mg via INTRAVENOUS

## 2021-09-28 MED ORDER — 0.9 % SODIUM CHLORIDE (POUR BTL) OPTIME
TOPICAL | Status: DC | PRN
  Administered 2021-09-28: 1000 mL

## 2021-09-28 MED ORDER — SODIUM CHLORIDE 0.9 % IV BOLUS
500.0000 mL | Freq: Once | INTRAVENOUS | Status: AC
  Administered 2021-09-28: 500 mL via INTRAVENOUS

## 2021-09-28 MED ORDER — IOHEXOL 300 MG/ML  SOLN
INTRAMUSCULAR | Status: DC | PRN
  Administered 2021-09-28: 15 mL

## 2021-09-28 MED ORDER — SODIUM ZIRCONIUM CYCLOSILICATE 10 G PO PACK
10.0000 g | PACK | Freq: Once | ORAL | Status: AC
  Administered 2021-09-28: 10 g via ORAL
  Filled 2021-09-28: qty 1

## 2021-09-28 MED ORDER — SUGAMMADEX SODIUM 200 MG/2ML IV SOLN
INTRAVENOUS | Status: DC | PRN
  Administered 2021-09-28: 152.6 mg via INTRAVENOUS

## 2021-09-28 MED ORDER — METRONIDAZOLE 500 MG PO TABS
500.0000 mg | ORAL_TABLET | Freq: Two times a day (BID) | ORAL | Status: DC
  Administered 2021-09-28 – 2021-10-01 (×7): 500 mg via ORAL
  Filled 2021-09-28 (×9): qty 1

## 2021-09-28 MED ORDER — TRAMADOL HCL 50 MG PO TABS
50.0000 mg | ORAL_TABLET | Freq: Four times a day (QID) | ORAL | Status: DC | PRN
  Administered 2021-09-28 – 2021-09-29 (×3): 50 mg via ORAL
  Filled 2021-09-28 (×3): qty 1

## 2021-09-28 MED ORDER — MIDAZOLAM HCL 2 MG/2ML IJ SOLN
INTRAMUSCULAR | Status: AC
  Filled 2021-09-28: qty 2

## 2021-09-28 MED ORDER — PHENYLEPHRINE HCL (PRESSORS) 10 MG/ML IV SOLN
INTRAVENOUS | Status: DC | PRN
  Administered 2021-09-28: 120 ug via INTRAVENOUS

## 2021-09-28 MED ORDER — ESMOLOL HCL 100 MG/10ML IV SOLN
INTRAVENOUS | Status: AC
  Filled 2021-09-28: qty 10

## 2021-09-28 SURGICAL SUPPLY — 23 items
BAG DRN RND TRDRP ANRFLXCHMBR (UROLOGICAL SUPPLIES) ×2
BAG URINE DRAIN 2000ML AR STRL (UROLOGICAL SUPPLIES) ×4 IMPLANT
BAG URO CATCHER STRL LF (MISCELLANEOUS) ×3 IMPLANT
CATH FOLEY 2WAY SLVR  5CC 16FR (CATHETERS) ×2
CATH FOLEY 2WAY SLVR 5CC 16FR (CATHETERS) IMPLANT
CATH URET 5FR 28IN OPEN ENDED (CATHETERS) ×1 IMPLANT
GLOVE SURG ENC MOIS LTX SZ7.5 (GLOVE) ×3 IMPLANT
GOWN STRL REUS W/ TWL LRG LVL3 (GOWN DISPOSABLE) ×2 IMPLANT
GOWN STRL REUS W/ TWL XL LVL3 (GOWN DISPOSABLE) ×2 IMPLANT
GOWN STRL REUS W/TWL LRG LVL3 (GOWN DISPOSABLE) ×2
GOWN STRL REUS W/TWL XL LVL3 (GOWN DISPOSABLE) ×2
GUIDEWIRE ANG ZIPWIRE 038X150 (WIRE) ×1 IMPLANT
GUIDEWIRE STR DUAL SENSOR (WIRE) ×3 IMPLANT
KIT TURNOVER KIT B (KITS) ×3 IMPLANT
MANIFOLD NEPTUNE II (INSTRUMENTS) ×1 IMPLANT
NS IRRIG 1000ML POUR BTL (IV SOLUTION) ×3 IMPLANT
PACK CYSTO (CUSTOM PROCEDURE TRAY) ×3 IMPLANT
PAD ARMBOARD 7.5X6 YLW CONV (MISCELLANEOUS) ×6 IMPLANT
STENT URET 6FRX26 CONTOUR (STENTS) ×1 IMPLANT
SYPHON OMNI JUG (MISCELLANEOUS) ×3 IMPLANT
TOWEL GREEN STERILE FF (TOWEL DISPOSABLE) ×3 IMPLANT
TUBE CONNECTING 12X1/4 (SUCTIONS) ×1 IMPLANT
UNDERPAD 30X36 HEAVY ABSORB (UNDERPADS AND DIAPERS) ×3 IMPLANT

## 2021-09-28 NOTE — Anesthesia Postprocedure Evaluation (Signed)
Anesthesia Post Note ? ?Patient: Aaron Klein ? ?Procedure(s) Performed: CYSTOSCOPY WITH RETROGRADE PYELOGRAM/URETERAL STENT PLACEMENT (Right) ? ?  ? ?Patient location during evaluation: PACU ?Anesthesia Type: General ?Level of consciousness: awake and alert ?Pain management: pain level controlled ?Vital Signs Assessment: post-procedure vital signs reviewed and stable ?Respiratory status: spontaneous breathing, nonlabored ventilation, respiratory function stable and patient connected to nasal cannula oxygen ?Cardiovascular status: blood pressure returned to baseline and stable ?Postop Assessment: no apparent nausea or vomiting ?Anesthetic complications: no ? ? ?No notable events documented. ? ?Last Vitals:  ?Vitals:  ? 09/25/2021 1932 10/10/2021 2000  ?BP: 119/76 (!) 124/96  ?Pulse: 91 (!) 101  ?Resp: 12   ?Temp:  37.1 ?C  ?SpO2: 91% 91%  ?  ?Last Pain:  ?Vitals:  ? 10/06/2021 2000  ?TempSrc: Oral  ?PainSc: 0-No pain  ? ? ?  ?  ?  ?  ?  ?  ? ?[place] S ? ? ? ? ?

## 2021-09-28 NOTE — Op Note (Addendum)
Preoperative diagnosis: Sepsis, right proximal ureteral stone and hydronephrosis ?Postoperative diagnosis: Same ? ?Procedure: Cystoscopy with right retrograde pyelogram and right ureteral stent placement ? ?Surgeon: Junious Silk ? ?Anesthesia: General ? ?Indication for procedure: Yaziel is a 76 year old male admitted with sepsis.  CT scan revealed a 6 to 7 mm right proximal ureteral stone and hydronephrosis.  He was stabilized and brought for urgent stent today.  His platelets were 37,000, so he is not a good candidate for nephrostomy tube. ? ?Findings: On exam under anesthesia the penis was uncircumcised but normal foreskin without phimosis.  The glans and meatus appeared normal.  Scrotum appeared normal. ? ?On cystoscopy the urethra and prostatic urethra were unremarkable.  There were no stone or foreign body in the bladder.  The bladder was quite edematous and contractile.  It was hard for it to relax and expand.  Trigone and ureteral orifice ease appeared normal. ? ?Right retrograde pyelogram-this outlined a single ureter single collecting system unit with some tortuosity in the proximal ureter and filling defect consistent with the stone with initially very little contrast getting proximally. ? ?On wire passage it took quite a few tries to get an angled Glidewire past the stone and coiled up into the collecting system.  The stone seemed quite impacted. ? ?Description of procedure: After consent was obtained patient brought to the operating room.  After adequate anesthesia he was placed lithotomy position and prepped and draped in the usual sterile fashion.  Timeout was performed to confirm the patient and procedure.  Cystoscope was passed per urethra and the bladder inspected.  Right ureteral orifice was cannulated with a 5 [place] open-ended catheter and retrograde injection of contrast was performed.  I then passed a Glidewire but it coiled when it hit the stone and was rejected.  I then passed the open-ended  catheter approximately an inch did up toward the stone trying wire passage and then injecting some contrast to approximate the open-ended to the stone location.  Once I was close enough I took a hemostat and attached it to the wire to be able to manipulate the wire angle and was able to get past the stone and coiled that in the collecting system.  I then advanced a 5 [place] open-ended up into the renal pelvis and remove the wire.  A good hydronephrotic drip was noted.  I injected some more contrast which filled the renal pelvis and collecting system confirming placement in the lumen.  The wire was readvanced in the upper calyx and the open-ended catheter removed.  A 626 cm stent was advanced.  The wire was removed with a good coil seen in the upper calyx and a good coil in the bladder.  The scope was removed and 16 [place] Foley replaced.  Irrigation was clear.  He was awakened taken recovery room in stable condition. ? ?Complications: None ? ?Blood loss: Minimal ? ?Specimens: None ? ?Drains: 6 x 26 cm right ureteral stent, Foley catheter ? ?Disposition: Patient stable to PACU - I spoke to Richard and went over the procedure, post-op care and follow-up  ?

## 2021-09-28 NOTE — Plan of Care (Signed)
?  Problem: Education: Goal: Knowledge of General Education information will improve Description: Including pain rating scale, medication(s)/side effects and non-pharmacologic comfort measures Outcome: Progressing   Problem: Health Behavior/Discharge Planning: Goal: Ability to manage health-related needs will improve Outcome: Progressing   Problem: Clinical Measurements: Goal: Ability to maintain clinical measurements within normal limits will improve Outcome: Progressing Goal: Will remain free from infection Outcome: Progressing   Problem: Nutrition: Goal: Adequate nutrition will be maintained Outcome: Progressing   

## 2021-09-28 NOTE — Transfer of Care (Signed)
Immediate Anesthesia Transfer of Care Note ? ?Patient: Aaron Klein ? ?Procedure(s) Performed: CYSTOSCOPY WITH RETROGRADE PYELOGRAM/URETERAL STENT PLACEMENT (Right) ? ?Patient Location: PACU ? ?Anesthesia Type:General ? ?Level of Consciousness: awake ? ?Airway & Oxygen Therapy: Patient Spontanous Breathing ? ?Post-op Assessment: Report given to RN and Post -op Vital signs reviewed and stable ? ?Post vital signs: Reviewed and stable ? ?Last Vitals:  ?Vitals Value Taken Time  ?BP 91/72 10/12/2021 1847  ?Temp    ?Pulse 98 10/20/2021 1847  ?Resp 13 10/19/2021 1847  ?SpO2 78 % 10/08/2021 1847  ?Vitals shown include unvalidated device data. ? ?Last Pain:  ?Vitals:  ? 10/05/2021 1704  ?TempSrc:   ?PainSc: 5   ?   ? ?Patients Stated Pain Goal: 4 (09/23/2021 1232) ? ?Complications: No notable events documented. ?

## 2021-09-28 NOTE — Progress Notes (Signed)
[facility] ?PLAN OF CARE NOTE ?Patient: Aaron Klein PTW:[id number]   ?PCP: Aletha Halim., PA-C DOB: 1946-05-13   ?DOA: 10/04/2021   DOS: 10/18/2021   ? ?Patient was admitted by my colleague earlier on 10/09/2021. I have reviewed the H&P as well as assessment and plan and agree with the same. ?Important changes in the plan are listed below. ? ?Plan of care: ?Principal Problem: ?  Sepsis ([place]) ?Active Problems: ?  Malignant neoplasm of glottis ([place]) ?  AKI (acute kidney injury) ([place]) ?  Thrombocytopenia ([place]) ?  Elevated LFTs ?  Community acquired pneumonia ?Klebsiella bacteremia. ?Continue with IV ceftriaxone. ?Added Flagyl for aspiration coverage as the patient appears to have aspiration pneumonia as well. ?Severe thrombocytopenia most likely sepsis rather than DIC or TTP like presentation. ?Urology will take the patient for cystoscopy today. ?Advance to D3 diet after the procedure. ? ?Author: ?Berle Mull, MD ?[facility] ?10/17/2021 5:15 PM   ?If 7PM-7AM, please contact night-coverage at [email or web address] ?

## 2021-09-28 NOTE — Progress Notes (Signed)
Patient status post right ureteral stent for right ureteral stone and sepsis- ? ?-DC Foley when it is no longer needed to monitor urine output ?-DC home when patient medically stable.  Follow-up on chart for 10/11/2021 at [facility] at 8:45 AM.  Discussed with the patient and his brother the rationale for a staged procedure and the importance of follow-up to plan stent and stone removal. ? ?Appreciate excellent [facility] care. ?

## 2021-09-28 NOTE — Progress Notes (Signed)
PHARMACY - PHYSICIAN COMMUNICATION ?CRITICAL VALUE ALERT - BLOOD CULTURE IDENTIFICATION (BCID) ? ?Aaron Klein is an 76 y.o. male who presented to [facility] on 09/29/2021 with a chief complaint of weakness, generalized body aches, and fever. Developed respiratory distress and hypotension. Hypotension has resolved after IV fluids and antibiotics. ? ?Assessment:  Source is likely pneumonia. WBC has improved to 6.2 on current regimen. Patient is afebrile and normotensive. ? ?Name of physician (or Provider) Contacted: Dr. Berle Mull ? ?Current antibiotics: Ceftriaxone and azithromycin ? ?Changes to prescribed antibiotics recommended:  ? ?Continue Ceftriaxone 2 g every 24 hours ?Stop azithromycin ?Recommendations modified by provider to continue azithromycin ? ?Results for orders placed or performed during the hospital encounter of 10/19/2021  ?Blood Culture ID Panel (Reflexed) (Collected: 10/19/2021  6:04 PM)  ?Result Value Ref Range  ? Enterococcus faecalis NOT DETECTED NOT DETECTED  ? Enterococcus Faecium NOT DETECTED NOT DETECTED  ? Listeria monocytogenes NOT DETECTED NOT DETECTED  ? Staphylococcus species NOT DETECTED NOT DETECTED  ? Staphylococcus aureus (BCID) NOT DETECTED NOT DETECTED  ? Staphylococcus epidermidis NOT DETECTED NOT DETECTED  ? Staphylococcus lugdunensis NOT DETECTED NOT DETECTED  ? Streptococcus species NOT DETECTED NOT DETECTED  ? Streptococcus agalactiae NOT DETECTED NOT DETECTED  ? Streptococcus pneumoniae NOT DETECTED NOT DETECTED  ? Streptococcus pyogenes NOT DETECTED NOT DETECTED  ? A.calcoaceticus-baumannii NOT DETECTED NOT DETECTED  ? Bacteroides fragilis NOT DETECTED NOT DETECTED  ? Enterobacterales DETECTED (A) NOT DETECTED  ? Enterobacter cloacae complex NOT DETECTED NOT DETECTED  ? Escherichia coli NOT DETECTED NOT DETECTED  ? Klebsiella aerogenes NOT DETECTED NOT DETECTED  ? Klebsiella oxytoca NOT DETECTED NOT DETECTED  ? Klebsiella pneumoniae DETECTED (A) NOT DETECTED  ? Proteus  species NOT DETECTED NOT DETECTED  ? Salmonella species NOT DETECTED NOT DETECTED  ? Serratia marcescens NOT DETECTED NOT DETECTED  ? Haemophilus influenzae NOT DETECTED NOT DETECTED  ? Neisseria meningitidis NOT DETECTED NOT DETECTED  ? Pseudomonas aeruginosa NOT DETECTED NOT DETECTED  ? Stenotrophomonas maltophilia NOT DETECTED NOT DETECTED  ? Candida albicans NOT DETECTED NOT DETECTED  ? Candida auris NOT DETECTED NOT DETECTED  ? Candida glabrata NOT DETECTED NOT DETECTED  ? Candida krusei NOT DETECTED NOT DETECTED  ? Candida parapsilosis NOT DETECTED NOT DETECTED  ? Candida tropicalis NOT DETECTED NOT DETECTED  ? Cryptococcus neoformans/gattii NOT DETECTED NOT DETECTED  ? CTX-M ESBL NOT DETECTED NOT DETECTED  ? Carbapenem resistance IMP NOT DETECTED NOT DETECTED  ? Carbapenem resistance KPC NOT DETECTED NOT DETECTED  ? Carbapenem resistance NDM NOT DETECTED NOT DETECTED  ? Carbapenem resist OXA 48 LIKE NOT DETECTED NOT DETECTED  ? Carbapenem resistance VIM NOT DETECTED NOT DETECTED  ? ? ?Ursula Beath ?10/01/2021  11:40 AM ? ?

## 2021-09-28 NOTE — Discharge Instructions (Signed)
Be sure to keep follow-up with Dr. Cira Rue urology to plan stent and stone removal. ?

## 2021-09-28 NOTE — Progress Notes (Signed)
Plan of Care Note for accepted transfer ? ? ?Patient: Aaron Klein MRN: [id number]   DOA: 10/21/2021 ? ?Facility requesting transfer: [facility] ?Requesting Provider: Dr. Almyra Free ?Reason for transfer: Sepsis due to pneumonia ?Facility course:  ? ?76 year old male with past medical history of glottic cancer status post radiation therapy, chronic kidney disease stage IIIa, gastroesophageal reflux disease, hyperlipidemia, benign prostatic hyperplasia who presents to [facility] with complaints of weakness, generalized myalgias and fevers at home as high as 102 ?F. ? ?Upon evaluation in the emergency department patient was found to have multiple SIRS criteria including tachycardia with heart rates in the 120s, tachypnea and a substantial lactic acidosis initially at 6.7.  Patient was additionally found to be hypoxic and placed on supplemental oxygen via nasal cannula.  Initial work-up revealed that on chest x-ray there was evidence of a streaky opacity in the right upper lung suspicious for pneumonia.  Patient was therefore initiated on intravenous ceftriaxone and azithromycin. ? ?Chemistry reveals a modest hyponatremia of 127 with mild hypokalemia of 5.9 and evidence of substantial acute kidney injury with BUN of 61 and creatinine of 4.43. ? ?After 30 cc/kg of isotonic fluids administered lactic acid is now down to 4.9.  Patient has had minimal urine output with bladder scan does not reveal any urinary retention.  Urinalysis has been obtained revealing 21-50 white blood cells per high-powered field with small leukocyte esterase and nitrite negative. ? ?Plan of care: ?The patient is accepted for admission to Progressive unit, at [facility]..  ? ? ?Author: ?Vernelle Emerald, MD ?10/20/2021 ? ?Check [email or web address] for on-call coverage. ? ?Nursing staff, Please call [facility] number on Amion as soon as patient's arrival, so appropriate admitting provider can evaluate the pt. ?

## 2021-09-28 NOTE — Progress Notes (Signed)
HOSPITAL MEDICINE OVERNIGHT EVENT NOTE   ? ?Notified by nursing that chemistry this morning reveals slightly rising potassium of 6.1. ? ?Stat EKG obtained revealing narrow complex QRS.  There is some ST segment elevation anterior septal leads however this is upsloping and does not have a pattern consistent with ischemia.  Patient is currently chest pain-free. ? ?Ordering 5 units of NovoLog, half amp of D50, amp of sodium bicarbonate, 1 g of calcium gluconate and 10 g of Lokelma.  Repeat chemistry ordered for 12 PM. ? ?Continue to monitor closely on telemetry. ? ?Vernelle Emerald  MD ?[facility]  ? ? ? ? ? ? ? ? ? ? ?

## 2021-09-28 NOTE — Consult Note (Signed)
? ?[facility]  ?Telephone:([phone] [phone]  ? ? ?INITIAL HEMATOLOGY CONSULTATION ? ?Referring MD:  Dr. Berle Mull  ? ?Reason for Referral: Thrombocytopenia ? 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  He reports emergency department due to feeling weak.  He had a temp of 102 at home.  On admission, his white blood cell count and hemoglobin were normal platelets were 63,000.  Lactic acid was elevated at 6.7.  His sodium was 127, potassium 5.9, BUN 61, creatinine 4.43, calcium 10.9 AST 147, ALT 64, T. bili 1.5.  Platelets are down to 46,000 today.  His LDH is elevated at 1146, PT 16.7, INR 1.4, PTT 32, fibrinogen 775, D-dimer greater than 20.  No schistocytes were seen on smear.  Absolute reticulocyte count is normal with a mildly elevated immature reticulocyte fraction at 23.9%.  Chest x-ray suspicious for pneumonia.  CT renal stone study showed a prominent prostate, 6 x 4 x 5 mm proximal right ureteral stone with mild hydronephrosis.  He has been started on IV antibiotics.  Urology has been consulted and is planning for stent placement later today. ? ?His most recent CBC performed prior to this admission was on 06/28/2021 and at that time his platelet count was completely normal at 232,000. ? ?Today, the patient is resting quietly.  He feels tired and weak.  He continues to have back pain.  No fevers in the past 24 hours.  No chills.  He is not having any chest pain or shortness of breath.  He is not having any nausea, vomiting, constipation, diarrhea.  No bleeding reported.  The patient is divorced.  He has one child.  He continues to smoke.  Denies alcohol use.  Family history significant for a father with lung cancer.  Hematology was asked to see the  patient to make recommendations regarding his thrombocytopenia. ? ?Past Medical History:  ?Diagnosis Date  ? Arthritis   ? Cancer [facility]   ? larynx  ? ED (erectile dysfunction)   ? GERD (gastroesophageal reflux disease)   ? History of hiatal hernia   ? Hyperlipidemia   ? Irregular heart beat   ? Scoliosis   ?: ?Past Surgical History:  ?Procedure Laterality Date  ? COLONOSCOPY  10/10/2004  ? Deatra Ina - TA  2016-TA  ? COLONOSCOPY  11/26/2019  ? HERNIA REPAIR  1991  ? inguinal   ? MICROLARYNGOSCOPY N/A 04/18/2018  ? Procedure: SUSPENSION MICRO-DIRECT LARYNGOSTOPY WITH BIOPSY OF VOCAL CORD LESIONS;  Surgeon: Helayne Seminole, MD;  Location: [facility];  Service: ENT;  Laterality: N/A;  ? TONSILLECTOMY    ?: ?CURRENT MEDS: ?Current Facility-Administered Medications  ?Medication Dose Route Frequency Provider Last Rate Last Admin  ? 0.9 %  sodium chloride infusion   Intravenous Continuous Rise Patience, MD 125 mL/hr at 09/30/2021 0427 New Bag at 10/17/2021 0427  ? azithromycin (ZITHROMAX) 500 mg in sodium chloride 0.9 % 250 mL IVPB  500 mg Intravenous Q24H Rise Patience, MD      ? cefTRIAXone (ROCEPHIN) 2 g in sodium chloride 0.9 % 100 mL IVPB  2 g Intravenous Q24H Rise Patience, MD      ? finasteride (PROSCAR) tablet 5 mg  5 mg Oral Daily Lavina Hamman, MD   5 mg  at 10/22/2021 0940  ? pantoprazole (PROTONIX) EC tablet 40 mg  40 mg Oral Daily Lavina Hamman, MD   40 mg at 10/06/2021 0940  ? tamsulosin (FLOMAX) capsule 0.4 mg  0.4 mg Oral Daily Lavina Hamman, MD   0.4 mg at 09/29/2021 0940  ? traMADol (ULTRAM) tablet 50 mg  50 mg Oral Q6H PRN Rise Patience, MD   50 mg at 09/29/2021 7654  ? ?No Known Allergies: ? ?Family History  ?Problem Relation Age of Onset  ? Lung cancer Father   ? Congestive Heart Failure Father   ? Colon polyps Father   ? Colon polyps Brother   ? Colon cancer Neg Hx   ? Rectal cancer Neg Hx   ? Stomach cancer Neg Hx   ? Esophageal cancer Neg Hx   ?: ?Social History  ? ?Socioeconomic  History  ? Marital status: Single  ?  Spouse name: Not on file  ? Number of children: Not on file  ? Years of education: Not on file  ? Highest education level: Not on file  ?Occupational History  ? Not on file  ?Tobacco Use  ? Smoking status: Every Day  ?  Packs/day: 0.25  ?  Types: Cigarettes  ? Smokeless tobacco: Never  ? Tobacco comments:  ?  quit 04/25/18 but started back smoking  ?Vaping Use  ? Vaping Use: Never used  ?Substance and Sexual Activity  ? Alcohol use: No  ?  Alcohol/week: 0.0 standard drinks  ? Drug use: No  ? Sexual activity: Yes  ?Other Topics Concern  ? Not on file  ?Social History Narrative  ? Not on file  ? ?Social Determinants of Health  ? ?Financial Resource Strain: Not on file  ?Food Insecurity: Not on file  ?Transportation Needs: Not on file  ?Physical Activity: Not on file  ?Stress: Not on file  ?Social Connections: Not on file  ?Intimate Partner Violence: Not on file  ?: ? ?REVIEW OF SYSTEMS:  A comprehensive 14 point review of systems was negative except as noted in the HPI.   ? ?Exam: ?Patient Vitals for the past 24 hrs: ? BP Temp Temp src Pulse Resp SpO2 Height Weight  ?10/09/2021 0745 115/84 98.9 ?F (37.2 ?C) Oral 94 18 -- -- --  ?10/20/2021 0531 117/80 99.1 ?F (37.3 ?C) Oral 89 20 93 % -- --  ?10/17/2021 0430 -- -- -- -- -- -- -- 76.3 kg  ?09/25/2021 0038 121/70 99 ?F (37.2 ?C) Oral 85 20 94 % '5\' 9"'$  (1.753 m) 76.3 kg  ?10/08/2021 2330 (!) 115/103 -- -- 95 (!) 23 90 % -- --  ?10/21/2021 2300 118/70 -- -- 95 17 90 % -- --  ?10/11/2021 2230 125/74 -- -- 98 (!) 21 91 % -- --  ?10/14/2021 2145 120/73 -- -- 89 19 (!) 88 % -- --  ?10/01/2021 2130 (!) 126/114 -- -- 91 19 92 % -- --  ?10/11/2021 2115 115/72 -- -- 96 (!) 21 92 % -- --  ?10/15/2021 2112 127/81 98.3 ?F (36.8 ?C) Oral 86 20 99 % -- --  ?10/20/2021 2045 102/76 -- -- 92 16 91 % -- --  ?10/07/2021 2037 99/63 -- -- 95 20 91 % -- --  ?10/02/2021 2015 99/72 -- -- 94 16 91 % -- --  ?09/30/2021 2000 95/76 -- -- (!) 110 18 92 % -- --  ?10/13/2021 1945 94/61 -- -- (!)  52 15 99 % -- --  ?10/06/2021 1930 99/69 -- -- Marland Kitchen  102 18 91 % -- --  ?10/09/2021 1920 (!) 118/102 -- -- (!) 103 16 (!) 88 % -- --  ?10/14/2021 1845 94/67 -- -- (!) 102 20 91 % -- --  ?10/20/2021 1815 -- -- -- -- -- 90 % -- --  ?09/26/2021 1805 98/73 -- -- (!) 126 (!) 24 91 % -- --  ?10/06/2021 1753 -- -- -- -- -- -- '5\' 10"'$  (1.778 m) 76.3 kg  ?10/16/2021 1752 118/81 98.4 ?F (36.9 ?C) Oral (!) 128 20 (!) 86 % -- --  ? ? ?Physical Exam ?Vitals reviewed.  ?Constitutional:   ?   General: He is not in acute distress. ?HENT:  ?   Head: Normocephalic.  ?   Mouth/Throat:  ?   Pharynx: No oropharyngeal exudate or posterior oropharyngeal erythema.  ?Eyes:  ?   General: No scleral icterus. ?Cardiovascular:  ?   Rate and Rhythm: Normal rate and regular rhythm.  ?Pulmonary:  ?   Effort: Pulmonary effort is normal. No respiratory distress.  ?   Breath sounds: Normal breath sounds.  ?Abdominal:  ?   Palpations: Abdomen is soft.  ?   Tenderness: There is no abdominal tenderness.  ?Musculoskeletal:  ?   Right lower leg: No edema.  ?   Left lower leg: No edema.  ?Skin: ?   General: Skin is warm and dry.  ?Neurological:  ?   Mental Status: He is alert and oriented to person, place, and time.  ?Psychiatric:     ?   Mood and Affect: Mood normal.     ?   Behavior: Behavior normal.     ?   Thought Content: Thought content normal.     ?   Judgment: Judgment normal.  ? ?LABS: ? ?Lab Results  ?Component Value Date  ? WBC 6.2 09/23/2021  ? HGB 12.1 (L) 09/26/2021  ? HCT 34.8 (L) 10/02/2021  ? PLT 46 (L) 10/12/2021  ? GLUCOSE 62 (L) 10/12/2021  ? ALT 58 (H) 09/23/2021  ? AST 135 (H) 10/12/2021  ? NA 129 (L) 10/10/2021  ? K 6.1 (H) 09/26/2021  ? CL 97 (L) 10/07/2021  ? CREATININE 4.27 (H) 10/22/2021  ? BUN 62 (H) 10/01/2021  ? CO2 17 (L) 10/01/2021  ? INR 1.2 10/21/2021  ? ? ?DG Chest Portable 1 View ? ?Result Date: 10/22/2021 ?CLINICAL DATA:  Weakness.  Fever yesterday. EXAM: PORTABLE CHEST 1 VIEW COMPARISON:  Radiograph 12/06/2019 FINDINGS: Lung volumes are  low limiting assessment. Ill-defined and streaky opacity in the right upper lung zone. Heart size grossly stable. There is a large hiatal hernia that is suboptimally assessed on this portable exam. No pleu

## 2021-09-28 NOTE — Anesthesia Preprocedure Evaluation (Addendum)
Anesthesia Evaluation  ?Patient identified by MRN, date of birth, ID band ?Patient awake ? ? ? ?Reviewed: ?Allergy & Precautions, NPO status , Patient's Chart, lab work & pertinent test results ? ?History of Anesthesia Complications ?Negative for: history of anesthetic complications ? ?Airway ?Mallampati: I ? ?TM Distance: >3 FB ?Neck ROM: Full ? ? ? Dental ? ?(+) Edentulous Upper, Edentulous Lower ?  ?Pulmonary ?Current Smoker and Patient abstained from smoking.,  ?H/o laryngeal cancer: XRT ?  ?breath sounds clear to auscultation ? ? ? ? ? ? Cardiovascular ?(-) anginanegative cardio ROS ? ? ?Rhythm:Regular Rate:Normal ? ? ?  ?Neuro/Psych ?negative neurological ROS ?   ? GI/Hepatic ?GERD  Medicated,Elevated LFTs ?  ?Endo/Other  ?negative endocrine ROS ? 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 ?  ?Musculoskeletal ? ?(+) Arthritis ,  ? Abdominal ?  ?Peds ? Hematology ?plt 37k   ?Anesthesia Other Findings ? ? Reproductive/Obstetrics ? ?  ? ? ? ? ? ? ? ? ? ? ? ? ? ?  ?  ? ? ? ? ? ? ? ?Anesthesia Physical ?Anesthesia Plan ? ?ASA: 4 ? ?Anesthesia Plan: General  ? ?Post-op Pain Management: Minimal or no pain anticipated  ? ?Induction: Intravenous ? ?PONV Risk Score and Plan: 1 and Ondansetron ? ?Airway Management Planned: Oral ETT and Video Laryngoscope Planned ? ?Additional Equipment: None ? ?Intra-op Plan:  ? ?Post-operative Plan: Extubation in OR ? ?Informed Consent: I have reviewed the patients History and Physical, chart, labs and discussed the procedure including the risks, benefits and alternatives for the proposed anesthesia with the patient or authorized representative who has indicated his/her understanding and acceptance.  ? ?Patient has DNR.  ?Discussed DNR with patient and Suspend DNR. ?  ? ? ?Plan Discussed with: CRNA and Surgeon ? ?Anesthesia Plan Comments:   ? ? ? ? ? ? ?Anesthesia Quick Evaluation ? ?

## 2021-09-28 NOTE — Anesthesia Procedure Notes (Signed)
Procedure Name: Intubation ?Date/Time: 10/15/2021 5:55 PM ?Performed by: Minerva Ends, CRNA ?Pre-anesthesia Checklist: Patient identified, Emergency Drugs available, Suction available and Patient being monitored ?Patient Re-evaluated:Patient Re-evaluated prior to induction ?Oxygen Delivery Method: Circle system utilized ?Preoxygenation: Pre-oxygenation with 100% oxygen ?Induction Type: IV induction ?Ventilation: Mask ventilation without difficulty ?Laryngoscope Size: Mac, 3 and Glidescope ?Grade View: Grade I ?Tube type: Oral ?Number of attempts: 1 ?Airway Equipment and Method: Stylet ?Placement Confirmation: ETT inserted through vocal cords under direct vision, positive ETCO2 and breath sounds checked- equal and bilateral ?Secured at: 22 cm ?Tube secured with: Tape ?Dental Injury: Teeth and Oropharynx as per pre-operative assessment  ?Comments: Atraumatic, platelet count 30,000 ? ? ? ? ?

## 2021-09-28 NOTE — Progress Notes (Addendum)
HOSPITAL MEDICINE OVERNIGHT EVENT NOTE   ? ?Notified by nursing that patient has suddenly developed rapid tachycardia with heart rate in the 130s. ? ?Telemetry and EKG reviewed revealing sudden onset Of rapid narrow complex tachycardia.  Patient denies any associated shortness of breath or chest pain.  Patient is hemodynamically stable. ? ?Sudden onset is suggestive of a focal atrial tachycardia.  We will try a trial of 2.5 mg of intravenous metoprolol and monitor closely. ? ?Aaron Emerald  MD ?[facility]  ? ?ADDENDUM 4/7 1:40am ? ?HR minimally improved with dose of Metoprolol, BP is tenuous.    Continuing to monitor on telemetry. ? ?Notified by nursing that platelet count continuing to downtrend to 26 without evidence of bleeding. Thought to be secondary to infection.   Continuing to avoid anticoagulants.  Will transfuse with platelets if evidence of bleeding or platelets drop to 10 or less. ? ?Also notified by nursing potassium is 6.0.  Getting repeat ECG and if no change will administer Lokelma 10 grams as monotherapy.  If ECG shows evidence of change will give remainder of treatment cocktail.  Continuing to monitor on telemetry. ? ?Aaron Klein ? ? ? ? ? ? ? ? ? ? ? ? ? ?

## 2021-09-28 NOTE — H&P (Addendum)
History and Physical    Aaron Klein RUE:[id number] DOB: 1946/06/05 DOA: 09/27/2021  PCP: Richmond Campbell., PA-C  Patient coming from: Home.  Chief Complaint: Feeling weak.  HPI: Aaron Klein is a 76 y.o. male with history of glotticcancerinremission GERD, low back pain, hyperlipidemia presents to the ER at med center with complaints of having feeling weakness for the last few days.  Also recorded temperature of her 102 degrees at home for which patient did take some Tylenol.  Denies any chest pain shortness of breath productive cough nausea vomiting or diarrhea.  ED Course: In the ER patient was tachycardic with fever of around 99 F blood pressures were in the low normal.  Chest x-ray was showing features concerning for pneumonia.  Patient had blood cultures drawn.  COVID test and flu were negative.  Patient labs show acute renal failure with creatinine of 4.4 the last one in our system in June 2021 was normal.  In addition patient also had hyponatremia hyperkalemia elevated anion gap acidosis hypercalcemia elevated LFTs BNP and CK levels are also elevated.  Patient has severe thrombocytopenia.  Bladder scan did not show much urine and had a Foley catheter placed.  Patient was started on empiric antibiotics for possible pneumonia admitted for further work-up.  Lactic acid initially was 6.7 which improved to 4.9 after fluid bolus.  Review of Systems: As per HPI, rest all negative.   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   ED (erectile dysfunction)    GERD (gastroesophageal reflux disease)    History of hiatal hernia    Hyperlipidemia    Irregular heart beat    Scoliosis     Past Surgical History:  Procedure Laterality Date   COLONOSCOPY  10/10/2004   Arlyce Dice - TA  [id number]   COLONOSCOPY  11/26/2019   HERNIA REPAIR  1991   inguinal    MICROLARYNGOSCOPY N/A 04/18/2018   Procedure: SUSPENSION MICRO-DIRECT LARYNGOSTOPY WITH BIOPSY OF VOCAL  CORD LESIONS;  Surgeon: Graylin Shiver, MD;  Location: [facility] OR;  Service: ENT;  Laterality: N/A;   TONSILLECTOMY       reports that he has been smoking cigarettes. He has been smoking an average of .25 packs per day. He has never used smokeless tobacco. He reports that he does not drink alcohol and does not use drugs.  No Known Allergies  Family History  Problem Relation Age of Onset   Lung cancer Father    Congestive Heart Failure Father    Colon polyps Father    Colon polyps Brother    Colon cancer Neg Hx    Rectal cancer Neg Hx    Stomach cancer Neg Hx    Esophageal cancer Neg Hx     Prior to Admission medications   Medication Sig Start Date End Date Taking? Authorizing Provider  Ascorbic Acid (VITAMIN C) 1000 MG tablet Take 1,000 mg by mouth daily.    [provider]  aspirin EC 81 MG tablet Take 81 mg by mouth daily.    [provider]  atorvastatin (LIPITOR) 10 MG tablet Take 10 mg by mouth daily.     [provider]  celecoxib (CELEBREX) 200 MG capsule Take 200 mg by mouth daily.  03/02/19   [provider]  Cholecalciferol (VITAMIN D3) 1000 units CAPS Take 1,000 Units by mouth daily.     [provider]  finasteride (PROSCAR) 5 MG tablet Take  5 mg by mouth daily.    [provider]  Flaxseed, Linseed, (FLAXSEED OIL) 1200 MG CAPS Take 2,400 mg by mouth daily.     [provider]  gabapentin (NEURONTIN) 100 MG capsule Take 100 mg by mouth 3 (three) times daily.    [provider]  HYDROcodone-acetaminophen (NORCO) 10-325 MG per tablet Take 1 tablet by mouth every 6 (six) hours as needed for moderate pain.     [provider]  Lecithin 1200 MG CAPS Take 2,400 mg by mouth daily.     [provider]  Multiple Vitamin (MULTIVITAMIN) tablet Take 1 tablet by mouth daily.    [provider]  Multiple Vitamins-Minerals (HEALTHY EYES PO) Take 1 capsule by mouth daily.    [provider]  Omega-3 Fatty Acids (FISH OIL PO) Take 2,000 mg by mouth daily.    [provider]  pantoprazole (PROTONIX) 40 MG tablet Take 40 mg by mouth daily.     [provider]  tamsulosin (FLOMAX) 0.4 MG CAPS capsule Take 1 capsule (0.4 mg total) by mouth daily. 12/09/19   Welborn, Ryan, DO  traMADol (ULTRAM) 50 MG tablet Take 50 mg by mouth 2 (two) times daily.    [provider]  vitamin E 400 UNIT capsule Take 400 Units by mouth daily.     [provider]    Physical Exam: Constitutional: Moderately built and nourished. Vitals:   10-20-21 2230 October 20, 2021 2300 Oct 20, 2021 2330 09/28/21 0038  BP: 125/74 118/70 (!) 115/103 121/70  Pulse: 98 95 95 85  Resp: (!) 21 17 (!) 23 20  Temp:    99 F (37.2 C)  TempSrc:    Oral  SpO2: 91% 90% 90% 94%  Weight:    76.3 kg  Height:    5\' 9"  (1.753 m)   Eyes: Anicteric no pallor. ENMT: No discharge from the ears eyes nose and mouth. Neck: No mass felt.  No neck rigidity. Respiratory: No rhonchi or crepitations. Cardiovascular: S1-S2 heard. Abdomen: Soft nontender bowel sound present. Musculoskeletal: No edema. Skin: No rash. Neurologic: Alert awake oriented to time place and person.  Moves all extremities. Psychiatric: Appears normal.  Normal affect.   Labs on Admission: I have personally reviewed following labs and imaging studies  CBC: Recent Labs  Lab 20-Oct-2021 1804 Oct 20, 2021 1911  WBC 8.0  --   NEUTROABS 5.4  --   HGB 14.4 11.6*  HCT 42.1 34.0*  MCV 82.9  --   PLT 63*  --    Basic Metabolic Panel: Recent Labs  Lab 20-Oct-2021 1804 October 20, 2021 1911  NA 127* 128*  K 5.9* 5.7*  CL 90*  --   CO2 19*  --   GLUCOSE 77  --   BUN 61*  --   CREATININE 4.43*  --   CALCIUM 10.9*  --    GFR: Estimated Creatinine Clearance: 14 mL/min (A) (by C-G formula based on SCr of 4.43 mg/dL (H)). Liver Function Tests: Recent Labs  Lab 10/20/2021 1804  AST 147*  ALT 64*  ALKPHOS 122  BILITOT 1.5*  PROT  7.6  ALBUMIN 3.9   No results for input(s): LIPASE, AMYLASE in the last 168 hours. No results for input(s): AMMONIA in the last 168 hours. Coagulation Profile: Recent Labs  Lab 10/20/21 1804  INR 1.2   Cardiac Enzymes: Recent Labs  Lab October 20, 2021 1804  CKTOTAL 400*   BNP (last 3 results) No results for input(s): PROBNP in the last 8760 hours.  HbA1C: No results for input(s): HGBA1C in the last 72 hours. CBG: No results for input(s): GLUCAP in the last 168 hours. Lipid Profile: No results for input(s): CHOL, HDL, LDLCALC, TRIG, CHOLHDL, LDLDIRECT in the last 72 hours. Thyroid Function Tests: No results for input(s): TSH, T4TOTAL, FREET4, T3FREE, THYROIDAB in the last 72 hours. Anemia Panel: No results for input(s): VITAMINB12, FOLATE, FERRITIN, TIBC, IRON, RETICCTPCT in the last 72 hours. Urine analysis:    Component Value Date/Time   COLORURINE YELLOW 09/27/2021 2113   APPEARANCEUR CLOUDY (A) 09/27/2021 2113   LABSPEC 1.021 09/27/2021 2113   PHURINE 6.0 09/27/2021 2113   GLUCOSEU NEGATIVE 09/27/2021 2113   HGBUR LARGE (A) 09/27/2021 2113   BILIRUBINUR NEGATIVE 09/27/2021 2113   KETONESUR NEGATIVE 09/27/2021 2113   PROTEINUR >300 (A) 09/27/2021 2113   NITRITE NEGATIVE 09/27/2021 2113   LEUKOCYTESUR SMALL (A) 09/27/2021 2113   Sepsis Labs: @LABRCNTIP (procalcitonin:4,lacticidven:4) ) Recent Results (from the past 240 hour(s))  Resp Panel by RT-PCR (Flu A&B, Covid) Nasopharyngeal Swab     Status: None   Collection Time: 09/27/21  6:04 PM   Specimen: Nasopharyngeal Swab; Nasopharyngeal(NP) swabs in vial transport medium  Result Value Ref Range Status   SARS Coronavirus 2 by RT PCR NEGATIVE NEGATIVE Final    Comment: (NOTE) SARS-CoV-2 target nucleic acids are NOT DETECTED.  The SARS-CoV-2 RNA is generally detectable in upper respiratory specimens during the acute phase of infection. The lowest concentration of SARS-CoV-2 viral copies this assay can detect is 138  copies/mL. A negative result does not preclude SARS-Cov-2 infection and should not be used as the sole basis for treatment or other patient management decisions. A negative result may occur with  improper specimen collection/handling, submission of specimen other than nasopharyngeal swab, presence of viral mutation(s) within the areas targeted by this assay, and inadequate number of viral copies(<138 copies/mL). A negative result must be combined with clinical observations, patient history, and epidemiological information. The expected result is Negative.  Fact Sheet for Patients:  BloggerCourse.com  Fact Sheet for Healthcare Providers:  SeriousBroker.it  This test is no t yet approved or cleared by the [place] FDA and  has been authorized for detection and/or diagnosis of SARS-CoV-2 by FDA under an Emergency Use Authorization (EUA). This EUA will remain  in effect (meaning this test can be used) for the duration of the COVID-19 declaration under Section 564(b)(1) of the Act, 21 U.S.C.section 360bbb-3(b)(1), unless the authorization is terminated  or revoked sooner.       Influenza A by PCR NEGATIVE NEGATIVE Final   Influenza B by PCR NEGATIVE NEGATIVE Final    Comment: (NOTE) The Xpert Xpress SARS-CoV-2/FLU/RSV plus assay is intended as an aid in the diagnosis of influenza from Nasopharyngeal swab specimens and should not be used as a sole basis for treatment. Nasal washings and aspirates are unacceptable for Xpert Xpress SARS-CoV-2/FLU/RSV testing.  Fact Sheet for Patients: BloggerCourse.com  Fact Sheet for Healthcare Providers: SeriousBroker.it  This test is not yet approved or cleared by the [place] FDA and has been authorized for detection and/or diagnosis of SARS-CoV-2 by FDA under an Emergency Use Authorization (EUA). This EUA will remain in effect (meaning  this test can be used) for the duration of the COVID-19 declaration under Section 564(b)(1) of the Act, 21 U.S.C. section 360bbb-3(b)(1), unless the authorization is terminated or revoked.  Performed at [facility], [address] [place], [place] [address]      Radiological Exams on Admission: DG Chest  Portable 1 View  Result Date: 09/27/2021 CLINICAL DATA:  Weakness.  Fever yesterday. EXAM: PORTABLE CHEST 1 VIEW COMPARISON:  Radiograph 12/06/2019 FINDINGS: Lung volumes are low limiting assessment. Ill-defined and streaky opacity in the right upper lung zone. Heart size grossly stable. There is a large hiatal hernia that is suboptimally assessed on this portable exam. No pleural effusion, pneumothorax, or pulmonary edema. Scoliosis. IMPRESSION: 1. Low lung volumes with streaky opacity in the right upper lung zone, suspicious for pneumonia. 2. Large hiatal hernia, suboptimally assessed on this portable exam. Electronically Signed   By: Narda Rutherford M.D.   On: 09/27/2021 18:25    EKG: Independently reviewed.  Sinus tachycardia.  Assessment/Plan Principal Problem:   Sepsis (HCC) Active Problems:   Malignant neoplasm of glottis (HCC)   AKI (acute kidney injury) (HCC)   Thrombocytopenia (HCC)   Elevated LFTs   Community acquired pneumonia    Sepsis likely from pneumonia her urine also shows WBCs and bacteria.  Will follow cultures.  Continue with hydration antibiotics.  Follow lactic acid levels. Acute renal failure with hyperkalemia hyponatremia and hypercalcemia -patient has had prior admission in June 2021 when patient had obstructive uropathy was discharged home on Foley catheter.  At this time patient urine does show a lot of proteins likely having chronic component.  CT renal study has been ordered which is pending.  We will continue with hydration follow metabolic panel.  Patient was given [facility] for the hyperkalemia.  Since patient has thrombocytopenia we  will check LDH and smear studies to make sure there is no hemolytic process. Severe thrombocytopenia could be from sepsis.  However since patient has acute renal failure we will check LDH to make sure there is no hemolytic process going on.  Check blood smear.  Follow CBC closely.  Abdomen appears benign on exam. Elevated LFTs could be from sepsis.  Check LDH to make sure that there is no hemolytic process.  Closely follow LFTs.  Check acute hepatitis panel. High anion gap metabolic acidosis likely from sepsis renal failure.  Follow metabolic panel after hydration. History of GERD takes PPI.  Need to verify home medication. History of chronic low back pain with history of scoliosis on tramadol. History of hyperlipidemia needs to verify home medication.  Note that patient CK levels are mildly elevated. Tobacco abuse advised about quitting. History of glottic cancer status post therapy presently under observation with ENT and radiation oncologist.  Since patient has sepsis physiology at presentation with acute renal failure will need close monitoring and inpatient status.  Addendum -consulted Dr. Leonides Schanz of hematology about the worsening platelets.  And on-call urology about the proximal ureteral stone with hydronephrosis.   DVT prophylaxis: SCDs.  Avoiding anticoagulation in the setting of severe thrombocytopenia. Code Status: Full code. Family Communication: Discussed with patient. Disposition Plan: Home. Consults called: None. Admission status: Inpatient.   Eduard Clos MD [facility] Pager [phone].  If 7PM-7AM, please contact night-coverage [email or web address] Password [email or web address]  09/28/2021, 2:45 AM

## 2021-09-28 NOTE — Progress Notes (Signed)
?  Transition of Care (TOC) Screening Note ? ? ?Patient Details  ?Name: Aaron Klein ?Date of Birth: [birth year] ? ? ?Transition of Care (TOC) CM/SW Contact:    ?Milas Gain, LCSWA ?Phone Number: ?10/16/2021, 3:21 PM ? ? ? ?Transition of Care Department [facility] has reviewed patient and no TOC needs have been identified at this time. We will continue to monitor patient advancement through interdisciplinary progression rounds. If new patient transition needs arise, please place a TOC consult. ?  ?

## 2021-09-28 NOTE — Consult Note (Signed)
Urology Consult Note  ? ?Requesting Attending Physician:  Lavina Hamman, MD ?Service Providing Consult: Urology  ? ?Reason for Consult:  Nephrolithiasis ? ?HPI: Aaron Klein is seen in consultation for reasons noted above at the request of Lavina Hamman, MD for evaluation of right proximal ureteral stone and sepsis. ? ?He notes that he has had symptoms for several days, including weakness and back pain.  The patient denies any lower urinary tract symptoms, specifically dysuria, hematuria or urinary tract infection.  ? ?He states he had a fever of 102 at home, but no documented fevers here. He was initially tachycardic, but hemodynamics have improved and are stable now. CXR with possible concern for pneumonia. Viral testing was negative. His labwork demonstrated ARF w/ Cr of 4.4 with electrolyte abnormalities. His lactic acid was initially elevated to 6.7, which is now down trending to 3.9 most recently. He was not in retention upon arrival but Foley catheter was placed due to his renal dysfunction and prior history of retention. ? ?He has no previous history of kidney stones. He endorses a family history of nephrolithiasis in his older brother. He has a history of BPH with urinary retention for which he follows yearly with Dr. Milford Cage. He is currently managed with flomax and finasteride. ? ? ?Past Medical History: ?Past Medical History:  ?Diagnosis Date  ? Arthritis   ? Cancer [facility])   ? larynx  ? ED (erectile dysfunction)   ? GERD (gastroesophageal reflux disease)   ? History of hiatal hernia   ? Hyperlipidemia   ? Irregular heart beat   ? Scoliosis   ? ? ?Past Surgical History:  ?Past Surgical History:  ?Procedure Laterality Date  ? COLONOSCOPY  10/10/2004  ? Deatra Ina - TA  2016-TA  ? COLONOSCOPY  11/26/2019  ? HERNIA REPAIR  1991  ? inguinal   ? MICROLARYNGOSCOPY N/A 04/18/2018  ? Procedure: SUSPENSION MICRO-DIRECT LARYNGOSTOPY WITH BIOPSY OF VOCAL CORD LESIONS;  Surgeon: Helayne Seminole, MD;  Location:  [place];  Service: ENT;  Laterality: N/A;  ? TONSILLECTOMY    ? ? ?Medication: ?Current Facility-Administered Medications  ?Medication Dose Route Frequency Provider Last Rate Last Admin  ? 0.9 %  sodium chloride infusion   Intravenous Continuous Rise Patience, MD 125 mL/hr at 09/24/2021 0427 New Bag at 09/23/2021 0427  ? azithromycin (ZITHROMAX) 500 mg in sodium chloride 0.9 % 250 mL IVPB  500 mg Intravenous Q24H Rise Patience, MD      ? calcium gluconate 1 g/ 50 mL sodium chloride IVPB  1 g Intravenous Once Vernelle Emerald, MD 50 mL/hr at 10/19/2021 0641 1,000 mg at 10/06/2021 0641  ? cefTRIAXone (ROCEPHIN) 2 g in sodium chloride 0.9 % 100 mL IVPB  2 g Intravenous Q24H Rise Patience, MD      ? traMADol Veatrice Bourbon) tablet 50 mg  50 mg Oral Q6H PRN Rise Patience, MD   50 mg at 09/26/2021 8119  ? ? ?Allergies: ?No Known Allergies ? ?Social History: ?Social History  ? ?Tobacco Use  ? Smoking status: Every Day  ?  Packs/day: 0.25  ?  Types: Cigarettes  ? Smokeless tobacco: Never  ? Tobacco comments:  ?  quit 04/25/18 but started back smoking  ?Vaping Use  ? Vaping Use: Never used  ?Substance Use Topics  ? Alcohol use: No  ?  Alcohol/week: 0.0 standard drinks  ? Drug use: No  ? ? ?Family History ?Family History  ?Problem Relation Age of  Onset  ? Lung cancer Father   ? Congestive Heart Failure Father   ? Colon polyps Father   ? Colon polyps Brother   ? Colon cancer Neg Hx   ? Rectal cancer Neg Hx   ? Stomach cancer Neg Hx   ? Esophageal cancer Neg Hx   ? ? ?Review of Systems ?10 systems were reviewed and are negative except as noted specifically in the HPI. ? ?Objective  ? ?Vital signs in last 24 hours: ?BP 117/80 (BP Location: Left Arm)   Pulse 89   Temp 99.1 ?F (37.3 ?C) (Oral)   Resp 20   Ht '5\' 9"'$  (1.753 m)   Wt 76.3 kg   SpO2 93%   BMI 24.84 kg/m?  ? ?Physical Exam ?General: NAD, A&O, resting, appropriate ?HEENT: [place]/AT, EOMI, MMM ?Pulmonary: Normal work of breathing ?Cardiovascular: HDS, adequate  peripheral perfusion ?Abdomen: Soft, NTTP, nondistended. ?GU: Foley catheter draining concentrated yellow urine, no CVA tenderness ?Extremities: warm and well perfused ?Neuro: Appropriate, no focal neurological deficits ? ?Most Recent Labs: ?Lab Results  ?Component Value Date  ? WBC 6.2 10/18/2021  ? HGB 12.1 (L) 09/26/2021  ? HCT 34.8 (L) 09/26/2021  ? PLT 46 (L) 10/02/2021  ? ? ?Lab Results  ?Component Value Date  ? NA 129 (L) 10/22/2021  ? K 6.1 (H) 10/14/2021  ? CL 97 (L) 09/26/2021  ? CO2 17 (L) 10/16/2021  ? BUN 62 (H) 10/19/2021  ? CREATININE 4.27 (H) 10/20/2021  ? CALCIUM 8.9 10/16/2021  ? ? ?Lab Results  ?Component Value Date  ? INR 1.2 10/11/2021  ? APTT 32 10/14/2021  ? ? ?IMAGING: ?DG Chest Portable 1 View ? ?Result Date: 10/21/2021 ?CLINICAL DATA:  Weakness.  Fever yesterday. EXAM: PORTABLE CHEST 1 VIEW COMPARISON:  Radiograph 12/06/2019 FINDINGS: Lung volumes are low limiting assessment. Ill-defined and streaky opacity in the right upper lung zone. Heart size grossly stable. There is a large hiatal hernia that is suboptimally assessed on this portable exam. No pleural effusion, pneumothorax, or pulmonary edema. Scoliosis. IMPRESSION: 1. Low lung volumes with streaky opacity in the right upper lung zone, suspicious for pneumonia. 2. Large hiatal hernia, suboptimally assessed on this portable exam. Electronically Signed   By: Keith Rake M.D.   On: 10/10/2021 18:25  ? ?CT RENAL STONE STUDY ? ?Result Date: 10/06/2021 ?CLINICAL DATA:  Bladder neck obstruction. EXAM: CT ABDOMEN AND PELVIS WITHOUT CONTRAST TECHNIQUE: Multidetector CT imaging of the abdomen and pelvis was performed following the standard protocol without IV contrast. RADIATION DOSE REDUCTION: This exam was performed according to the departmental dose-optimization program which includes automated exposure control, adjustment of the mA and/or kV according to patient size and/or use of iterative reconstruction technique. COMPARISON:  CT without  contrast 12/05/2019 FINDINGS: Lower chest: There is a small layering right pleural effusion with adjacent atelectasis or consolidation in the posterior basal right lower lobe. Lung bases are otherwise clear. Sizable herniation of the posteromedial left hemidiaphragm is again noted with inverted intrathoracic stomach. There is mild cardiomegaly with calcific CAD. Hepatobiliary: 18 cm length slightly steatotic liver. There is a calcified granuloma in the right lobe inferiorly. No other focal abnormality is seen without contrast. There are few stones in the gallbladder but no wall thickening or biliary dilatation. Pancreas: Unremarkable without contrast. Spleen: Unremarkable without contrast. Adrenals/Urinary Tract: No focal abnormality of the adrenal glands, unenhanced renal cortex. There is perinephric stranding on the right-greater-than-left, which was greater previously. No intrarenal stone is seen either side but  on the right, there is a proximal ureteral stone measuring 6 x 4 x 5 mm associated with mild hydronephrosis and superimposing over the proximal right L4 transverse process. Both of the ureters are otherwise clear. The bladder is catheterized contracted, appears mildly thickened in general. Apparently there is a specific question as to possible bladder neck obstruction. This is not well evaluated without contrast. If there is a bladder outlet problem it is probably due to the enlarged prostate which measures 5.1 cm transversely. 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 There is no small bowel dilatation or inflammatory change. The appendix is normal. There are colonic diverticula without evidence of acute colitis or diverticulitis. Vascular/Lymphatic: Heavy aortoiliac atherosclerosis. 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 No adenopathy is seen. Reproductive: Enlarged prostate 5.1 cm transverse, similar to prior study with prominent calcifications posteriorly. Other: There is mild fluid in the right pararenal space and righ

## 2021-09-29 ENCOUNTER — Inpatient Hospital Stay (HOSPITAL_COMMUNITY): Payer: Medicare HMO

## 2021-09-29 ENCOUNTER — Encounter (HOSPITAL_COMMUNITY): Payer: Self-pay | Admitting: Urology

## 2021-09-29 DIAGNOSIS — R0602 Shortness of breath: Secondary | ICD-10-CM

## 2021-09-29 DIAGNOSIS — I483 Typical atrial flutter: Secondary | ICD-10-CM | POA: Diagnosis present

## 2021-09-29 DIAGNOSIS — R6520 Severe sepsis without septic shock: Secondary | ICD-10-CM | POA: Diagnosis not present

## 2021-09-29 DIAGNOSIS — A419 Sepsis, unspecified organism: Secondary | ICD-10-CM | POA: Diagnosis not present

## 2021-09-29 DIAGNOSIS — N170 Acute kidney failure with tubular necrosis: Secondary | ICD-10-CM | POA: Diagnosis not present

## 2021-09-29 LAB — CBC WITH DIFFERENTIAL/PLATELET
Abs Immature Granulocytes: 0 10*3/uL (ref 0.00–0.07)
Basophils Absolute: 0 10*3/uL (ref 0.0–0.1)
Basophils Relative: 0 %
Eosinophils Absolute: 0 10*3/uL (ref 0.0–0.5)
Eosinophils Relative: 0 %
HCT: 32.7 % — ABNORMAL LOW (ref 39.0–52.0)
Hemoglobin: 11.5 g/dL — ABNORMAL LOW (ref 13.0–17.0)
Lymphocytes Relative: 4 %
Lymphs Abs: 0.2 10*3/uL — ABNORMAL LOW (ref 0.7–4.0)
MCH: 29.6 pg (ref 26.0–34.0)
MCHC: 35.2 g/dL (ref 30.0–36.0)
MCV: 84.3 fL (ref 80.0–100.0)
Monocytes Absolute: 0.2 10*3/uL (ref 0.1–1.0)
Monocytes Relative: 4 %
Neutro Abs: 5.1 10*3/uL (ref 1.7–7.7)
Neutrophils Relative %: 92 %
Platelets: 26 10*3/uL — CL (ref 150–400)
RBC: 3.88 MIL/uL — ABNORMAL LOW (ref 4.22–5.81)
RDW: 14.4 % (ref 11.5–15.5)
Smear Review: DECREASED
WBC: 5.5 10*3/uL (ref 4.0–10.5)
nRBC: 0.4 % — ABNORMAL HIGH (ref 0.0–0.2)
nRBC: 2 /100 WBC — ABNORMAL HIGH

## 2021-09-29 LAB — ECHOCARDIOGRAM COMPLETE
Area-P 1/2: 4.06 cm2
Height: 69 in
S' Lateral: 3.4 cm
Single Plane A4C EF: 8.1 %
Weight: 2771.2 oz

## 2021-09-29 LAB — BLOOD GAS, ARTERIAL
Acid-base deficit: 9.8 mmol/L — ABNORMAL HIGH (ref 0.0–2.0)
Bicarbonate: 16.7 mmol/L — ABNORMAL LOW (ref 20.0–28.0)
Drawn by: 35849
O2 Saturation: 94.5 %
Patient temperature: 37
pCO2 arterial: 38 mmHg (ref 32–48)
pH, Arterial: 7.25 — ABNORMAL LOW (ref 7.35–7.45)
pO2, Arterial: 73 mmHg — ABNORMAL LOW (ref 83–108)

## 2021-09-29 LAB — NA AND K (SODIUM & POTASSIUM), RAND UR
Potassium Urine: 58 mmol/L
Sodium, Ur: 43 mmol/L

## 2021-09-29 LAB — OSMOLALITY, URINE: Osmolality, Ur: 467 mOsm/kg (ref 300–900)

## 2021-09-29 LAB — CBC
HCT: 34.2 % — ABNORMAL LOW (ref 39.0–52.0)
Hemoglobin: 11.4 g/dL — ABNORMAL LOW (ref 13.0–17.0)
MCH: 28.6 pg (ref 26.0–34.0)
MCHC: 33.3 g/dL (ref 30.0–36.0)
MCV: 85.7 fL (ref 80.0–100.0)
Platelets: 23 10*3/uL — CL (ref 150–400)
RBC: 3.99 MIL/uL — ABNORMAL LOW (ref 4.22–5.81)
RDW: 14.5 % (ref 11.5–15.5)
WBC: 5.9 10*3/uL (ref 4.0–10.5)
nRBC: 0.5 % — ABNORMAL HIGH (ref 0.0–0.2)

## 2021-09-29 LAB — COMPREHENSIVE METABOLIC PANEL
ALT: 48 U/L — ABNORMAL HIGH (ref 0–44)
ALT: 51 U/L — ABNORMAL HIGH (ref 0–44)
AST: 108 U/L — ABNORMAL HIGH (ref 15–41)
AST: 94 U/L — ABNORMAL HIGH (ref 15–41)
Albumin: 2.3 g/dL — ABNORMAL LOW (ref 3.5–5.0)
Albumin: 2.3 g/dL — ABNORMAL LOW (ref 3.5–5.0)
Alkaline Phosphatase: 94 U/L (ref 38–126)
Alkaline Phosphatase: 97 U/L (ref 38–126)
Anion gap: 13 (ref 5–15)
Anion gap: 14 (ref 5–15)
BUN: 74 mg/dL — ABNORMAL HIGH (ref 8–23)
BUN: 81 mg/dL — ABNORMAL HIGH (ref 8–23)
CO2: 16 mmol/L — ABNORMAL LOW (ref 22–32)
CO2: 18 mmol/L — ABNORMAL LOW (ref 22–32)
Calcium: 8.9 mg/dL (ref 8.9–10.3)
Calcium: 9 mg/dL (ref 8.9–10.3)
Chloride: 100 mmol/L (ref 98–111)
Chloride: 102 mmol/L (ref 98–111)
Creatinine, Ser: 3.86 mg/dL — ABNORMAL HIGH (ref 0.61–1.24)
Creatinine, Ser: 3.95 mg/dL — ABNORMAL HIGH (ref 0.61–1.24)
GFR, Estimated: 15 mL/min — ABNORMAL LOW (ref >60)
GFR, Estimated: 15 mL/min — ABNORMAL LOW (ref >60)
Glucose, Bld: 59 mg/dL — ABNORMAL LOW (ref 70–99)
Glucose, Bld: 77 mg/dL (ref 70–99)
Potassium: 5.6 mmol/L — ABNORMAL HIGH (ref 3.5–5.1)
Potassium: 6 mmol/L — ABNORMAL HIGH (ref 3.5–5.1)
Sodium: 131 mmol/L — ABNORMAL LOW (ref 135–145)
Sodium: 132 mmol/L — ABNORMAL LOW (ref 135–145)
Total Bilirubin: 0.6 mg/dL (ref 0.3–1.2)
Total Bilirubin: 0.9 mg/dL (ref 0.3–1.2)
Total Protein: 5.9 g/dL — ABNORMAL LOW (ref 6.5–8.1)
Total Protein: 6 g/dL — ABNORMAL LOW (ref 6.5–8.1)

## 2021-09-29 LAB — MAGNESIUM: Magnesium: 2.1 mg/dL (ref 1.7–2.4)

## 2021-09-29 LAB — POTASSIUM
Potassium: 5.6 mmol/L — ABNORMAL HIGH (ref 3.5–5.1)
Potassium: 5.4 mmol/L — ABNORMAL HIGH (ref 3.5–5.1)

## 2021-09-29 LAB — CREATININE, URINE, RANDOM: Creatinine, Urine: 106.71 mg/dL

## 2021-09-29 LAB — HAPTOGLOBIN: Haptoglobin: 267 mg/dL (ref 34–355)

## 2021-09-29 MED ORDER — MIDODRINE HCL 5 MG PO TABS
10.0000 mg | ORAL_TABLET | Freq: Three times a day (TID) | ORAL | Status: DC
  Administered 2021-09-29 – 2021-10-01 (×7): 10 mg via ORAL
  Filled 2021-09-29 (×7): qty 2

## 2021-09-29 MED ORDER — HYDROCODONE-ACETAMINOPHEN 5-325 MG PO TABS
1.0000 | ORAL_TABLET | Freq: Four times a day (QID) | ORAL | Status: DC | PRN
  Administered 2021-09-29 – 2021-09-30 (×3): 1 via ORAL
  Filled 2021-09-29 (×3): qty 1

## 2021-09-29 MED ORDER — ACETAMINOPHEN 650 MG RE SUPP: 650.0000 mg | Freq: Four times a day (QID) | RECTAL | Status: DC | PRN

## 2021-09-29 MED ORDER — PERFLUTREN LIPID MICROSPHERE
1.0000 mL | INTRAVENOUS | Status: AC | PRN
  Administered 2021-09-29: 2 mL via INTRAVENOUS
  Filled 2021-09-29: qty 10

## 2021-09-29 MED ORDER — POLYETHYLENE GLYCOL 3350 17 G PO PACK: 17.0000 g | PACK | Freq: Every day | ORAL | Status: DC | PRN

## 2021-09-29 MED ORDER — ONDANSETRON HCL 4 MG PO TABS
4.0000 mg | ORAL_TABLET | Freq: Four times a day (QID) | ORAL | Status: DC | PRN
Start: 2021-09-29 — End: 2021-10-03

## 2021-09-29 MED ORDER — SODIUM ZIRCONIUM CYCLOSILICATE 10 G PO PACK
10.0000 g | PACK | Freq: Two times a day (BID) | ORAL | Status: DC
  Administered 2021-09-29 (×2): 10 g via ORAL
  Filled 2021-09-29 (×3): qty 1

## 2021-09-29 MED ORDER — ALBUMIN HUMAN 25 % IV SOLN
25.0000 g | Freq: Once | INTRAVENOUS | Status: AC
  Administered 2021-09-29: 25 g via INTRAVENOUS
  Filled 2021-09-29: qty 100

## 2021-09-29 MED ORDER — BISACODYL 10 MG RE SUPP: 10.0000 mg | Freq: Every day | RECTAL | Status: DC | PRN

## 2021-09-29 MED ORDER — ONDANSETRON HCL 4 MG/2ML IJ SOLN: 4.0000 mg | Freq: Four times a day (QID) | INTRAMUSCULAR | Status: DC | PRN

## 2021-09-29 MED ORDER — ACETAMINOPHEN 325 MG PO TABS
650.0000 mg | ORAL_TABLET | Freq: Four times a day (QID) | ORAL | Status: DC | PRN
  Administered 2021-09-30 – 2021-10-01 (×2): 650 mg via ORAL
  Filled 2021-09-29 (×2): qty 2

## 2021-09-29 MED ORDER — SODIUM ZIRCONIUM CYCLOSILICATE 10 G PO PACK
10.0000 g | PACK | Freq: Two times a day (BID) | ORAL | Status: DC
  Filled 2021-09-29: qty 1

## 2021-09-29 MED ORDER — SODIUM ZIRCONIUM CYCLOSILICATE 10 G PO PACK
10.0000 g | PACK | Freq: Once | ORAL | Status: AC
  Administered 2021-09-29: 10 g via ORAL
  Filled 2021-09-29: qty 1

## 2021-09-29 MED ORDER — LIDOCAINE 5 % EX PTCH
1.0000 | MEDICATED_PATCH | CUTANEOUS | Status: DC
  Administered 2021-09-29 – 2021-10-01 (×3): 1 via TRANSDERMAL
  Filled 2021-09-29 (×3): qty 1

## 2021-09-29 MED ORDER — SODIUM CHLORIDE 0.9 % IV BOLUS
500.0000 mL | Freq: Once | INTRAVENOUS | Status: AC
  Administered 2021-09-29: 500 mL via INTRAVENOUS

## 2021-09-29 MED ORDER — SODIUM BICARBONATE 650 MG PO TABS
1300.0000 mg | ORAL_TABLET | Freq: Two times a day (BID) | ORAL | Status: DC
  Administered 2021-09-29 – 2021-10-01 (×5): 1300 mg via ORAL
  Filled 2021-09-29 (×5): qty 2

## 2021-09-29 NOTE — Progress Notes (Signed)
?Progress Note ?Patient: Aaron Klein BPZ:[id number] DOB: 25-Sep-1945 DOA: 09/23/2021  ?DOS: the patient was seen and examined on 09/29/2021 ? ?Brief hospital course: ?Aaron Klein is a 76 y.o. male with history of glotticcancerinremission GERD, low back pain, hyperlipidemia presents to the ER at med center with complaints of having feeling weakness for the last few days.  ?Found to have gram-negative sepsis with Klebsiella bacteremia most likely secondary to UTI as well as acute kidney injury and bicytopenia as well as acute on chronic hypoxic respiratory failure in the setting of aspiration pneumonia. ?EKG also shows new onset atrial flutter likely in response to respiratory distress and elevated troponin. ?Nephrology, urology, hematology and palliative care consulted. ? ?Assessment and Plan: ?Severe sepsis secondary to aspiration pneumonia and UTI, present on admission ?Klebsiella bacteremia ?Acute hypoxic respiratory failure, present on admission ?Presents with complaints of fatigue. ?Met SIRS criteria on admission with fever, leukocytosis, hypotension and tachycardia. ?Started on IV antibiotics. ?Blood cultures came back positive for Klebsiella. ?CT scan shows evidence of hydronephrosis. ?Urology was consulted underwent stent placement. ?Urine culture also growing gram-negative rods. ?Chest x-ray shows multifocal pneumonia. ?Requiring 2 L of oxygen on admission currently on 9 L of oxygen. ?ABG shows no evidence of hypercarbia but shows severe hypoxia. ?We will use BiPAP as needed. ?Monitor. ?May require consult to ICU if condition worsens. ? ?Acute kidney injury with hyperkalemia, hyponatremia and hypocalcemia. ?Mostly combination of ATN with obstructive uropathy. ?Patient has a Foley catheter as well as stent placement which should treat acute kidney will continue. ?But suspect that the patient is suffering from dense ATN in the setting of sepsis. ?Monitor. ?Circumferential medication. ?Renal  dosing. ?Hyperkalemia currently being treated with Lokelma. ? ?Thrombocytopenia. ?Likely in the setting of gram-negative sepsis. ?Work-up so far negative for any DIC or TTP or HUS like panel. ?Monitor for now. ?Appreciate hematology consultation. ? ?LFT elevation. ?Likely in the setting of sepsis and congestion. ?Monitor for now. ? ?Metabolic acidosis ?Likely from renal failure. ?Monitor. ? ?History of glottic cancer. ?Currently under observation.  Monitor. ? ?Typical atrial flutter. ?Currently rate controlled although intermittently goes with RVR. ?At present limited room for further rate control medication. ?Definitely not a candidate for anticoagulation given his severe thrombocytopenia. ?Currently receiving midodrine.  Will monitor. ? ?Goals of care conversation. ?Discussion with the patient yesterday as well as today. ?Yesterday patient mentions that he would does not want to be resuscitated in any situation but would like to be placed on BiPAP if needed. ?Therefore patient was placed on limited code. ?This morning patient had a conversation with the RN and would like to be resuscitated. ?Further discussion in the presence of brother was done.  Patient would like to attempt to undergo CPR but would not like to be kept alive artificially on life support machines. ?Palliative care consulted. ?CODE STATUS changed to full code based on these currently admitted ? ?Subjective: No nausea no vomiting no fever no chills.  Reports fatigue and tiredness.  Reports pain in the buttocks as well.  No no diarrhea. ? ?Physical Exam: ?Vitals:  ? 09/29/21 0400 09/29/21 0720 09/29/21 1243 09/29/21 1655  ?BP:  104/84 96/69 (!) 113/98  ?Pulse: (!) 131 (!) 132 92 (!) 106  ?Resp:    20  ?Temp:  98 ?F (36.7 ?C)  98.2 ?F (36.8 ?C)  ?TempSrc:  Oral Oral Oral  ?SpO2: 95% 92% 93% 100%  ?Weight:      ?Height:      ? ?General: Appear in moderate  distress; no visible Abnormal Neck Mass Or lumps, Conjunctiva normal ?Cardiovascular: S1 and S2  Present, no Murmur, ?Respiratory: increased respiratory effort, Bilateral Air entry present and bilateral Crackles, Occasional wheezes ?Abdomen: Bowel Sound present, Non tender ?Extremities: no Pedal edema ?Neurology: alert and oriented to time, place, and person ?Gait not checked due to patient safety concerns  ? ?Data Reviewed: ?I have Reviewed nursing notes, Vitals, and Lab results since pt's last encounter. Pertinent lab results CBC BMI BMP ?I have ordered test including CBC, CMP ?I have ordered imaging studies x-ray chest.  Echocardiogram ?I have independently visualized and interpreted EKG which showed EKG: Typical atrial flutter, rate controlled. ?I have discussed pt's care plan and test results with nephrology.  ? ?Family Communication: Brother at bedside ? ?Disposition: ?Status is: Inpatient ?Remains inpatient appropriate because: Ongoing issues and sepsis physiology requiring aggressive therapy. ? ?Author: ?Berle Mull, MD ?09/29/2021 7:10 PM ? ?For on call review [email or web address]. ?

## 2021-09-29 NOTE — Hospital Course (Signed)
Aaron Klein is a 76 y.o. male with history of glotticcancerinremission GERD, low back pain, hyperlipidemia presents to the ER at med center with complaints of having feeling weakness for the last few days.  ?Found to have gram-negative sepsis with Klebsiella bacteremia most likely secondary to UTI as well as acute kidney injury and bicytopenia as well as acute on chronic hypoxic respiratory failure in the setting of aspiration pneumonia. ?EKG also shows new onset atrial flutter likely in response to respiratory distress and elevated troponin. ?Nephrology, urology, hematology and palliative care consulted. ?

## 2021-09-29 NOTE — TOC Initial Note (Signed)
Transition of Care (TOC) - Initial/Assessment Note  ? ? ?Patient Details  ?Name: Aaron Klein ?MRN: [id number] ?Date of Birth: [birth year] ? ?Transition of Care [facility]) CM/SW Contact:    ?Ninfa Meeker, RN ?Phone Number: ?09/29/2021, 11:06 AM ? ?Clinical Narrative:                 ?TOC Screening Note: ? ?Transition of Care Department [facility]) has reviewed patient and no TOC needs have been identified at this time. We will continue to monitor patient advancement through Interdisciplinary progressions. If new patient transition needs arise, please place a consult.  ? ? ?  ?  ? ? ?Patient Goals and CMS Choice ?  ?  ?  ? ?Expected Discharge Plan and Services ?  ?  ?  ?  ?  ?                ?  ?  ?  ?  ?  ?  ?  ?  ?  ?  ? ?Prior Living Arrangements/Services ?  ?  ?  ?       ?  ?  ?  ?  ? ?Activities of Daily Living ?Home Assistive Devices/Equipment: None ?ADL Screening (condition at time of admission) ?Patient's cognitive ability adequate to safely complete daily activities?: Yes ?Is the patient deaf or have difficulty hearing?: No ?Does the patient have difficulty seeing, even when wearing glasses/contacts?: No ?Does the patient have difficulty concentrating, remembering, or making decisions?: No ?Patient able to express need for assistance with ADLs?: Yes ?Does the patient have difficulty dressing or bathing?: No ?Independently performs ADLs?: Yes (appropriate for developmental age) ?Does the patient have difficulty walking or climbing stairs?: No ?Weakness of Legs: Both ?Weakness of Arms/Hands: Both ? ?Permission Sought/Granted ?  ?  ?   ?   ?   ?   ? ?Emotional Assessment ?  ?  ?  ?  ?  ?  ? ?Admission diagnosis:  Hypoxemia [R09.02] ?Hyperkalemia [E87.5] ?AKI (acute kidney injury) ([place]) [N17.9] ?Sepsis ([place]) [A41.9] ?Community acquired pneumonia, unspecified laterality [J18.9] ?Patient Active Problem List  ? Diagnosis Date Noted  ? AKI (acute kidney injury) ([place]) 10/07/2021  ? Thrombocytopenia ([place]) 10/22/2021  ?  Elevated LFTs 10/19/2021  ? Community acquired pneumonia   ? Sepsis ([place]) 10/21/2021  ? Kidney failure 12/05/2019  ? Renal failure 12/05/2019  ? 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 ?PCP:  Aletha Halim., PA-C ?Pharmacy:   ?[id number] [place], [place] - [address] ?[address]?Phone: [phone]: [phone]? ?[place]?Phone: [phone]: [phone]? ? ? ? ?Social Determinants of Health (SDOH) Interventions ?  ? ?Readmission Risk Interventions ?   ? View : No data to display.  ?  ?  ?  ? ? ? ?

## 2021-09-29 NOTE — Progress Notes (Signed)
?  Echocardiogram ?2D Echocardiogram has been performed. ? ?[name] ?09/29/2021, 2:30 PM ?

## 2021-09-29 NOTE — Plan of Care (Signed)
°  Problem: Education: °Goal: Knowledge of General Education information will improve °Description: Including pain rating scale, medication(s)/side effects and non-pharmacologic comfort measures °Outcome: Progressing °  °Problem: Clinical Measurements: °Goal: Cardiovascular complication will be avoided °Outcome: Progressing °  °Problem: Activity: °Goal: Risk for activity intolerance will decrease °Outcome: Progressing °  °

## 2021-09-29 NOTE — Consult Note (Addendum)
[facility]  HISTORY AND PHYSICAL  Aaron Klein is an 76 y.o. male.    Chief Complaint: weakness  HPI: PT is a 30M with a PMH sig for arthritis, glottic ca (in remission), scoliosis, and h/o obstructive uropathy who presented to [facility] for weakness.  He was found to have sepsis, AKI, severe thrombocytopenia, hyponatremia, and hyperkalemia.  CT scan showed a R 6-50mm stone with R sided hydro.  Blood cultures were + for Klebsiella which was also in his urine.  On antibiotics. He went to the OR with Dr Mena Goes 09/28/21 and had a R retrograde pyelogram with stent placement.  Has a Foley.  Looks like celebrex was on his OP med list.  Pressures are soft.  Cr was initially 4.43 on admission, has come down to 3.86. K is still between 5.5-6.0, getting some lokelma.  Had Afib/flutter last night which was controlled with IV metop. In this setting we are asked to see.    Pt has increased O2 requirement from 3L-6L today.  He is able to sleep flat when I walk in the room.  Brother and SO are with him.   PMH: Past Medical History:  Diagnosis Date   Arthritis    Cancer (HCC)    larynx   ED (erectile dysfunction)    GERD (gastroesophageal reflux disease)    History of hiatal hernia    Hyperlipidemia    Irregular heart beat    Scoliosis    PSH: Past Surgical History:  Procedure Laterality Date   COLONOSCOPY  10/10/2004   Arlyce Dice - TA  1610-RU   COLONOSCOPY  11/26/2019   CYSTOSCOPY W/ URETERAL STENT PLACEMENT Right 09/28/2021   Procedure: CYSTOSCOPY WITH RETROGRADE PYELOGRAM/URETERAL STENT PLACEMENT;  Surgeon: Jerilee Field, MD;  Location: [facility] OR;  Service: Urology;  Laterality: Right;   HERNIA REPAIR  1991   inguinal    MICROLARYNGOSCOPY N/A 04/18/2018   Procedure: SUSPENSION MICRO-DIRECT LARYNGOSTOPY WITH BIOPSY OF VOCAL CORD LESIONS;  Surgeon: Graylin Shiver, MD;  Location: [facility] OR;  Service: ENT;  Laterality: N/A;   TONSILLECTOMY       Past Medical History:  Diagnosis Date    Arthritis    Cancer (HCC)    larynx   ED (erectile dysfunction)    GERD (gastroesophageal reflux disease)    History of hiatal hernia    Hyperlipidemia    Irregular heart beat    Scoliosis     Medications:  Scheduled:  Chlorhexidine Gluconate Cloth  6 each Topical Daily   finasteride  5 mg Oral Daily   metroNIDAZOLE  500 mg Oral BID   midodrine  10 mg Oral TID WC   pantoprazole  40 mg Oral Daily   sodium bicarbonate  1,300 mg Oral BID   sodium zirconium cyclosilicate  10 g Oral BID   tamsulosin  0.4 mg Oral Daily    Medications Prior to Admission  Medication Sig Dispense Refill   Ascorbic Acid (VITAMIN C) 1000 MG tablet Take 1,000 mg by mouth every morning.     aspirin EC 81 MG tablet Take 81 mg by mouth every morning.     celecoxib (CELEBREX) 200 MG capsule Take 200 mg by mouth daily.      Cholecalciferol (VITAMIN D3) 1000 units CAPS Take 1,000 Units by mouth every morning.     finasteride (PROSCAR) 5 MG tablet Take 5 mg by mouth at bedtime.     gabapentin (NEURONTIN) 100 MG capsule Take 100 mg by mouth 3 (three)  times daily.     HYDROcodone-acetaminophen (NORCO) 10-325 MG per tablet Take 1 tablet by mouth daily as needed (pain).     Lecithin 1200 MG CAPS Take 2,400 mg by mouth every morning.     Multiple Vitamin (MULTIVITAMIN WITH MINERALS) TABS tablet Take 1 tablet by mouth every morning.     Omega-3 Fatty Acids (FISH OIL) 1000 MG CAPS Take 2,000 mg by mouth every morning.     pantoprazole (PROTONIX) 40 MG tablet Take 40 mg by mouth every morning.     tamsulosin (FLOMAX) 0.4 MG CAPS capsule Take 1 capsule (0.4 mg total) by mouth daily. (Patient taking differently: Take 0.8 mg by mouth at bedtime.) 30 capsule 0   traMADol (ULTRAM) 50 MG tablet Take 50 mg by mouth 2 (two) times daily as needed (pain).     Vitamin E 670 MG (1000 UT) CAPS Take 1,000 Units by mouth every morning.      ALLERGIES:   Allergies  Allergen Reactions   Atorvastatin Other (See Comments)     Muscle aches    FAM HX: Family History  Problem Relation Age of Onset   Lung cancer Father    Congestive Heart Failure Father    Colon polyps Father    Colon polyps Brother    Colon cancer Neg Hx    Rectal cancer Neg Hx    Stomach cancer Neg Hx    Esophageal cancer Neg Hx     Social History:   reports that he has been smoking cigarettes. He has been smoking an average of .25 packs per day. He has never used smokeless tobacco. He reports that he does not drink alcohol and does not use drugs.  ROS: ROS: all other systems reviewed and are negative except as per HPI  Blood pressure 96/69, pulse 92, temperature 98 F (36.7 C), temperature source Oral, resp. rate 20, height 5\' 9"  (1.753 m), weight 78.6 kg, SpO2 93 %. PHYSICAL EXAM: Physical Exam GEN NAD, lying in bed but appears unwell HEENT EOMI PERRL temporal wasting NECK no jVD PULM coarse anteriorly CV RRR ABD soft GU: + Foley in place with serosanguinous urine EXT no LE edema NEURO AAO x 3 nonfocal    Results for orders placed or performed during the hospital encounter of 10/13/2021 (from the past 48 hour(s))  Comprehensive metabolic panel     Status: Abnormal   Collection Time: 03-Oct-2021  6:04 PM  Result Value Ref Range   Sodium 127 (L) 135 - 145 mmol/L   Potassium 5.9 (H) 3.5 - 5.1 mmol/L   Chloride 90 (L) 98 - 111 mmol/L   CO2 19 (L) 22 - 32 mmol/L   Glucose, Bld 77 70 - 99 mg/dL    Comment: Glucose reference range applies only to samples taken after fasting for at least 8 hours.   BUN 61 (H) 8 - 23 mg/dL   Creatinine, Ser 2.53 (H) 0.61 - 1.24 mg/dL   Calcium 66.4 (H) 8.9 - 10.3 mg/dL   Total Protein 7.6 6.5 - 8.1 g/dL   Albumin 3.9 3.5 - 5.0 g/dL   AST 403 (H) 15 - 41 U/L   ALT 64 (H) 0 - 44 U/L   Alkaline Phosphatase 122 38 - 126 U/L   Total Bilirubin 1.5 (H) 0.3 - 1.2 mg/dL   GFR, Estimated 13 (L) >60 mL/min    Comment: (NOTE) Calculated using the CKD-EPI Creatinine Equation (2021)    Anion gap 18 (H) 5 -  15  Comment: Performed at [facility], [address], [place], [place] [address]  Lactic acid, plasma     Status: Abnormal   Collection Time: 09/27/21  6:04 PM  Result Value Ref Range   Lactic Acid, Venous 6.7 (HH) 0.5 - 1.9 mmol/L    Comment: CRITICAL RESULT CALLED TO, READ BACK BY AND VERIFIED WITH: LESLIE SHULAR 09/27/21 AT 1853 HS Performed at [facility], [address] [place], [place] [address]   CBC with Differential     Status: Abnormal   Collection Time: 09/27/21  6:04 PM  Result Value Ref Range   WBC 8.0 4.0 - 10.5 K/uL   RBC 5.08 4.22 - 5.81 MIL/uL   Hemoglobin 14.4 13.0 - 17.0 g/dL   HCT 09.8 11.9 - 14.7 %   MCV 82.9 80.0 - 100.0 fL   MCH 28.3 26.0 - 34.0 pg   MCHC 34.2 30.0 - 36.0 g/dL   RDW 82.9 56.2 - 13.0 %   Platelets 63 (L) 150 - 400 K/uL    Comment: Immature Platelet Fraction may be clinically indicated, consider ordering this additional test [id number] PLATELET COUNT CONFIRMED BY SMEAR    nRBC 0.5 (H) 0.0 - 0.2 %   Neutrophils Relative % 52 %   Neutro Abs 5.4 1.7 - 7.7 K/uL   Band Neutrophils 16 %   Lymphocytes Relative 8 %   Lymphs Abs 0.6 (L) 0.7 - 4.0 K/uL   Monocytes Relative 4 %   Monocytes Absolute 0.3 0.1 - 1.0 K/uL   Eosinophils Relative 0 %   Eosinophils Absolute 0.0 0.0 - 0.5 K/uL   Basophils Relative 0 %   Basophils Absolute 0.0 0.0 - 0.1 K/uL   RBC Morphology MORPHOLOGY UNREMARKABLE    Smear Review PLATELETS APPEAR DECREASED    Metamyelocytes Relative 6 %   Myelocytes 14 %   Abs Immature Granulocytes 1.60 (H) 0.00 - 0.07 K/uL    Comment: Performed at [facility], [address], [place], [place] [address]  Protime-INR     Status: Abnormal   Collection Time: 09/27/21  6:04 PM  Result Value Ref Range   Prothrombin Time 15.5 (H) 11.4 - 15.2 seconds   INR 1.2 0.8 - 1.2    Comment: (NOTE) INR goal varies based on device and disease states. Performed at [facility], [address] [place], [place] [address]   Culture, blood (Routine x 2)     Status: Abnormal (Preliminary result)   Collection Time: 09/27/21  6:04 PM   Specimen: BLOOD  Result Value Ref Range   Specimen Description      BLOOD LEFT ARM Performed at [facility], [address] [place] [address] [place], [place] [address]    Special Requests      BOTTLES DRAWN AEROBIC AND ANAEROBIC Blood Culture adequate volume Performed at [facility], [address], [place], [place] [address]    Culture  Setup Time      GRAM NEGATIVE RODS IN BOTH AEROBIC AND ANAEROBIC BOTTLES Performed at [facility], [address], [place], [place] [address]    Culture KLEBSIELLA PNEUMONIAE (A)    Report Status PENDING   Culture, blood (Routine x 2)     Status: Abnormal (Preliminary result)   Collection Time: 09/27/21  6:04 PM   Specimen: BLOOD  Result Value Ref Range   Specimen Description      BLOOD RIGHT ARM Performed at [facility], [address] [place], [place] [address]  Special Requests      BOTTLES DRAWN AEROBIC AND ANAEROBIC Blood Culture adequate volume Performed at [facility], [address] [place], [place] [address]    Culture  Setup Time      GRAM NEGATIVE RODS IN BOTH AEROBIC AND ANAEROBIC BOTTLES CRITICAL RESULT CALLED TO, READ BACK BY AND VERIFIED WITH: PHARMD MASON WISE ON 09/28/21 @ 11:32 BY DRT    Culture (A)     KLEBSIELLA PNEUMONIAE SUSCEPTIBILITIES TO FOLLOW Performed at [facility], [address], [place], [place] [address]    Report Status PENDING   Resp Panel by RT-PCR (Flu A&B, Covid) Nasopharyngeal Swab     Status: None   Collection Time: October 12, 2021  6:04 PM   Specimen: Nasopharyngeal Swab; Nasopharyngeal(NP) swabs in vial transport medium  Result Value Ref Range   SARS Coronavirus 2 by RT PCR NEGATIVE NEGATIVE    Comment: (NOTE) SARS-CoV-2 target nucleic acids are NOT  DETECTED.  The SARS-CoV-2 RNA is generally detectable in upper respiratory specimens during the acute phase of infection. The lowest concentration of SARS-CoV-2 viral copies this assay can detect is 138 copies/mL. A negative result does not preclude SARS-Cov-2 infection and should not be used as the sole basis for treatment or other patient management decisions. A negative result may occur with  improper specimen collection/handling, submission of specimen other than nasopharyngeal swab, presence of viral mutation(s) within the areas targeted by this assay, and inadequate number of viral copies(<138 copies/mL). A negative result must be combined with clinical observations, patient history, and epidemiological information. The expected result is Negative.  Fact Sheet for Patients:  BloggerCourse.com  Fact Sheet for Healthcare Providers:  SeriousBroker.it  This test is no t yet approved or cleared by the [place] FDA and  has been authorized for detection and/or diagnosis of SARS-CoV-2 by FDA under an Emergency Use Authorization (EUA). This EUA will remain  in effect (meaning this test can be used) for the duration of the COVID-19 declaration under Section 564(b)(1) of the Act, 21 U.S.C.section 360bbb-3(b)(1), unless the authorization is terminated  or revoked sooner.       Influenza A by PCR NEGATIVE NEGATIVE   Influenza B by PCR NEGATIVE NEGATIVE    Comment: (NOTE) The Xpert Xpress SARS-CoV-2/FLU/RSV plus assay is intended as an aid in the diagnosis of influenza from Nasopharyngeal swab specimens and should not be used as a sole basis for treatment. Nasal washings and aspirates are unacceptable for Xpert Xpress SARS-CoV-2/FLU/RSV testing.  Fact Sheet for Patients: BloggerCourse.com  Fact Sheet for Healthcare Providers: SeriousBroker.it  This test is not yet approved or  cleared by the [place] FDA and has been authorized for detection and/or diagnosis of SARS-CoV-2 by FDA under an Emergency Use Authorization (EUA). This EUA will remain in effect (meaning this test can be used) for the duration of the COVID-19 declaration under Section 564(b)(1) of the Act, 21 U.S.C. section 360bbb-3(b)(1), unless the authorization is terminated or revoked.  Performed at [facility], [address] [place], [place] [address]   Brain natriuretic peptide     Status: Abnormal   Collection Time: 10/12/2021  6:04 PM  Result Value Ref Range   B Natriuretic Peptide 272.7 (H) 0.0 - 100.0 pg/mL    Comment: Performed at [facility], [address], [place], [place] [address]  CK     Status: Abnormal   Collection Time: 10-12-21  6:04 PM  Result Value Ref Range   Total CK 400 (  H) 49 - 397 U/L    Comment: Performed at [facility], [address] [place], [place] [address]  Blood Culture ID Panel (Reflexed)     Status: Abnormal   Collection Time: 09/27/21  6:04 PM  Result Value Ref Range   Enterococcus faecalis NOT DETECTED NOT DETECTED   Enterococcus Faecium NOT DETECTED NOT DETECTED   Listeria monocytogenes NOT DETECTED NOT DETECTED   Staphylococcus species NOT DETECTED NOT DETECTED   Staphylococcus aureus (BCID) NOT DETECTED NOT DETECTED   Staphylococcus epidermidis NOT DETECTED NOT DETECTED   Staphylococcus lugdunensis NOT DETECTED NOT DETECTED   Streptococcus species NOT DETECTED NOT DETECTED   Streptococcus agalactiae NOT DETECTED NOT DETECTED   Streptococcus pneumoniae NOT DETECTED NOT DETECTED   Streptococcus pyogenes NOT DETECTED NOT DETECTED   A.calcoaceticus-baumannii NOT DETECTED NOT DETECTED   Bacteroides fragilis NOT DETECTED NOT DETECTED   Enterobacterales DETECTED (A) NOT DETECTED    Comment: Enterobacterales represent a large order of gram negative bacteria, not a single organism. CRITICAL  RESULT CALLED TO, READ BACK BY AND VERIFIED WITH: PHARMD MASON WISE ON 09/28/21 @ 11:33 BY DRT    Enterobacter cloacae complex NOT DETECTED NOT DETECTED   Escherichia coli NOT DETECTED NOT DETECTED   Klebsiella aerogenes NOT DETECTED NOT DETECTED   Klebsiella oxytoca NOT DETECTED NOT DETECTED   Klebsiella pneumoniae DETECTED (A) NOT DETECTED    Comment: CRITICAL RESULT CALLED TO, READ BACK BY AND VERIFIED WITH: PHARMD MASON WISE ON 09/28/21 @ 11:33 BY DRT    Proteus species NOT DETECTED NOT DETECTED   Salmonella species NOT DETECTED NOT DETECTED   Serratia marcescens NOT DETECTED NOT DETECTED   Haemophilus influenzae NOT DETECTED NOT DETECTED   Neisseria meningitidis NOT DETECTED NOT DETECTED   Pseudomonas aeruginosa NOT DETECTED NOT DETECTED   Stenotrophomonas maltophilia NOT DETECTED NOT DETECTED   Candida albicans NOT DETECTED NOT DETECTED   Candida auris NOT DETECTED NOT DETECTED   Candida glabrata NOT DETECTED NOT DETECTED   Candida krusei NOT DETECTED NOT DETECTED   Candida parapsilosis NOT DETECTED NOT DETECTED   Candida tropicalis NOT DETECTED NOT DETECTED   Cryptococcus neoformans/gattii NOT DETECTED NOT DETECTED   CTX-M ESBL NOT DETECTED NOT DETECTED   Carbapenem resistance IMP NOT DETECTED NOT DETECTED   Carbapenem resistance KPC NOT DETECTED NOT DETECTED   Carbapenem resistance NDM NOT DETECTED NOT DETECTED   Carbapenem resist OXA 48 LIKE NOT DETECTED NOT DETECTED   Carbapenem resistance VIM NOT DETECTED NOT DETECTED    Comment: Performed at [facility], [address], [place], [place] [address]  I-Stat arterial blood gas, ED     Status: Abnormal   Collection Time: 09/27/21  7:11 PM  Result Value Ref Range   pH, Arterial 7.369 7.35 - 7.45   pCO2 arterial 33.2 32 - 48 mmHg   pO2, Arterial 63 (L) 83 - 108 mmHg   Bicarbonate 19.1 (L) 20.0 - 28.0 mmol/L   TCO2 20 (L) 22 - 32 mmol/L   O2 Saturation 91 %   Acid-base deficit 5.0 (H) 0.0 - 2.0 mmol/L   Sodium 128 (L)  135 - 145 mmol/L   Potassium 5.7 (H) 3.5 - 5.1 mmol/L   Calcium, Ion 1.20 1.15 - 1.40 mmol/L   HCT 34.0 (L) 39.0 - 52.0 %   Hemoglobin 11.6 (L) 13.0 - 17.0 g/dL   Patient temperature 08.6 F    Collection site RADIAL, ALLEN'S TEST ACCEPTABLE    Drawn by RT  Sample type ARTERIAL   Lactic acid, plasma     Status: Abnormal   Collection Time: Oct 14, 2021  7:56 PM  Result Value Ref Range   Lactic Acid, Venous 4.9 (HH) 0.5 - 1.9 mmol/L    Comment: CRITICAL RESULT CALLED TO, READ BACK BY AND VERIFIED WITH: B HONEYCUTT,RN 2047 10-14-21 DBRADLEY Performed at [facility], [address], [place], [place] [address]   Urinalysis, Routine w reflex microscopic Urine, Catheterized     Status: Abnormal   Collection Time: 10-14-21  9:13 PM  Result Value Ref Range   Color, Urine YELLOW YELLOW   APPearance CLOUDY (A) CLEAR   Specific Gravity, Urine 1.021 1.005 - 1.030   pH 6.0 5.0 - 8.0   Glucose, UA NEGATIVE NEGATIVE mg/dL   Hgb urine dipstick LARGE (A) NEGATIVE   Bilirubin Urine NEGATIVE NEGATIVE   Ketones, ur NEGATIVE NEGATIVE mg/dL   Protein, ur >409 (A) NEGATIVE mg/dL   Nitrite NEGATIVE NEGATIVE   Leukocytes,Ua SMALL (A) NEGATIVE   RBC / HPF >50 (H) 0 - 5 RBC/hpf   WBC, UA 21-50 0 - 5 WBC/hpf   Bacteria, UA MANY (A) NONE SEEN   Squamous Epithelial / LPF 0-5 0 - 5   WBC Clumps PRESENT    Mucus PRESENT     Comment: Performed at [facility], [address], [place], [place] [address]  Basic metabolic panel     Status: Abnormal   Collection Time: 09/28/21  3:42 AM  Result Value Ref Range   Sodium 129 (L) 135 - 145 mmol/L   Potassium 6.1 (H) 3.5 - 5.1 mmol/L   Chloride 97 (L) 98 - 111 mmol/L   CO2 17 (L) 22 - 32 mmol/L   Glucose, Bld 62 (L) 70 - 99 mg/dL    Comment: Glucose reference range applies only to samples taken after fasting for at least 8 hours.   BUN 62 (H) 8 - 23 mg/dL   Creatinine, Ser 4.78 (H) 0.61 - 1.24 mg/dL   Calcium 8.9 8.9 -  29.5 mg/dL   GFR, Estimated 14 (L) >60 mL/min    Comment: (NOTE) Calculated using the CKD-EPI Creatinine Equation (2021)    Anion gap 15 5 - 15    Comment: Performed at [facility], [address] [place], [place] [address]  Hepatic function panel     Status: Abnormal   Collection Time: 09/28/21  3:42 AM  Result Value Ref Range   Total Protein 6.0 (L) 6.5 - 8.1 g/dL   Albumin 2.5 (L) 3.5 - 5.0 g/dL   AST 865 (H) 15 - 41 U/L   ALT 58 (H) 0 - 44 U/L   Alkaline Phosphatase 93 38 - 126 U/L   Total Bilirubin 1.2 0.3 - 1.2 mg/dL   Bilirubin, Direct 0.5 (H) 0.0 - 0.2 mg/dL   Indirect Bilirubin 0.7 0.3 - 0.9 mg/dL    Comment: Performed at [facility], [address] [place], [place] [address]  CBC WITH DIFFERENTIAL     Status: Abnormal   Collection Time: 09/28/21  3:42 AM  Result Value Ref Range   WBC 6.2 4.0 - 10.5 K/uL   RBC 4.18 (L) 4.22 - 5.81 MIL/uL   Hemoglobin 12.1 (L) 13.0 - 17.0 g/dL   HCT 62.9 (L) 52.8 - 41.3 %   MCV 83.3 80.0 - 100.0 fL   MCH 28.9 26.0 - 34.0 pg   MCHC 34.8 30.0 - 36.0 g/dL   RDW 24.4 01.0 -  15.5 %   Platelets 46 (L) 150 - 400 K/uL    Comment: Immature Platelet Fraction may be clinically indicated, consider ordering this additional test [id number] REPEATED TO VERIFY PLATELET COUNT CONFIRMED BY SMEAR    nRBC 0.3 (H) 0.0 - 0.2 %   Neutrophils Relative % 75 %   Neutro Abs 4.6 1.7 - 7.7 K/uL   Lymphocytes Relative 6 %   Lymphs Abs 0.4 (L) 0.7 - 4.0 K/uL   Monocytes Relative 7 %   Monocytes Absolute 0.4 0.1 - 1.0 K/uL   Eosinophils Relative 0 %   Eosinophils Absolute 0.0 0.0 - 0.5 K/uL   Basophils Relative 1 %   Basophils Absolute 0.1 0.0 - 0.1 K/uL   WBC Morphology MILD LEFT SHIFT (1-5% METAS, OCC MYELO, OCC BANDS)    RBC Morphology MORPHOLOGY UNREMARKABLE    Smear Review PLATELETS APPEAR DECREASED    Immature Granulocytes 11 %   Abs Immature Granulocytes 0.68 (H) 0.00 - 0.07 K/uL    Comment: Performed at [facility],  [address], [place], [place] [address]  CK     Status: None   Collection Time: 2021/10/02  3:42 AM  Result Value Ref Range   Total CK 247 49 - 397 U/L    Comment: Performed at [facility] [place] [facility], [address], [place], [place] [address]  Lactate dehydrogenase     Status: Abnormal   Collection Time: 10/02/2021  3:42 AM  Result Value Ref Range   LDH 1,146 (H) 98 - 192 U/L    Comment: Performed at [facility], [address] [place], [place] [address]  Technologist smear review     Status: None   Collection Time: 2021-10-02  3:42 AM  Result Value Ref Range   WBC MORPHOLOGY MILD LEFT SHIFT (1-5% METAS, OCC MYELO, OCC BANDS)    RBC MORPHOLOGY MORPHOLOGY UNREMARKABLE    Tech Review MORPHOLOGY UNREMARKABLE     Comment: PLATELETS APPEAR DECREASED Performed at [facility], [address], [place], [place] [address]   Troponin I (High Sensitivity)     Status: Abnormal   Collection Time: 10-02-21  3:42 AM  Result Value Ref Range   Troponin I (High Sensitivity) 298 (HH) <18 ng/L    Comment: CRITICAL RESULT CALLED TO, READ BACK BY AND VERIFIED WITH: S NIGHT,RN Oct 02, 2021 0437 WILDERK (NOTE) Elevated high sensitivity troponin I (hsTnI) values and significant  changes across serial measurements may suggest ACS but many other  chronic and acute conditions are known to elevate hsTnI results.  Refer to the Links section for chest pain algorithms and additional  guidance. Performed at [facility], [address] [place], [place] [address]   APTT     Status: None   Collection Time: Oct 02, 2021  3:42 AM  Result Value Ref Range   aPTT 32 24 - 36 seconds    Comment: Performed at [facility], [address], [place], [place] [address]  Procalcitonin     Status: None   Collection Time: 2021-10-02  3:42 AM  Result Value Ref Range   Procalcitonin >150.00 ng/mL    Comment:        Interpretation: PCT >= 10 ng/mL: Important systemic inflammatory response, almost exclusively due to  severe bacterial sepsis or septic shock. (NOTE)       Sepsis PCT Algorithm           Lower Respiratory Tract  Infection PCT Algorithm    ----------------------------     ----------------------------         PCT < 0.25 ng/mL                PCT < 0.10 ng/mL          Strongly encourage             Strongly discourage   discontinuation of antibiotics    initiation of antibiotics    ----------------------------     -----------------------------       PCT 0.25 - 0.50 ng/mL            PCT 0.10 - 0.25 ng/mL               OR       >80% decrease in PCT            Discourage initiation of                                            antibiotics      Encourage discontinuation           of antibiotics    ----------------------------     -----------------------------         PCT >= 0.50 ng/mL              PCT 0.26 - 0.50 ng/mL                AND       <80% decrease in PCT             Encourage initiation of                                             antibiotics       Encourage continuation           of antibiotics    ----------------------------     -----------------------------        PCT >= 0.50 ng/mL                  PCT > 0.50 ng/mL               AND         increase in PCT                  Strongly encourage                                      initiation of antibiotics    Strongly encourage escalation           of antibiotics                                     -----------------------------                                           PCT <= 0.25 ng/mL  OR                                        > 80% decrease in PCT                                      Discontinue / Do not initiate                                             antibiotics  Performed at [facility], [address], [place], [place] [address]   Hepatitis panel, acute     Status: None   Collection Time: 09/28/21  3:42 AM  Result Value  Ref Range   Hepatitis B Surface Ag NON REACTIVE NON REACTIVE   HCV Ab NON REACTIVE NON REACTIVE    Comment: (NOTE) Nonreactive HCV antibody screen is consistent with no HCV infections,  unless recent infection is suspected or other evidence exists to indicate HCV infection.     Hep A IgM NON REACTIVE NON REACTIVE   Hep B C IgM NON REACTIVE NON REACTIVE    Comment: Performed at [facility], [address] [place] [address] [place], [place] [address]  Type and screen [facility]     Status: None   Collection Time: 09/28/21  3:43 AM  Result Value Ref Range   ABO/RH(D) B POS    Antibody Screen NEG    Sample Expiration      10/01/2021,2359 Performed at [facility], [address], [place], [place] [address]   ABO/Rh     Status: None   Collection Time: 09/28/21  4:31 AM  Result Value Ref Range   ABO/RH(D)      B POS Performed at [facility] [facility], [address], [place], [place] [address]   Lactic acid, plasma     Status: Abnormal   Collection Time: 09/28/21  4:31 AM  Result Value Ref Range   Lactic Acid, Venous 3.7 (HH) 0.5 - 1.9 mmol/L    Comment: CRITICAL RESULT CALLED TO, READ BACK BY AND VERIFIED WITH: Bethena Midget, RN [phone] 09/28/21 A GASKINS Performed at [facility], [address], [place], [place] [address]   Urine Culture     Status: Abnormal (Preliminary result)   Collection Time: 09/28/21  4:36 AM   Specimen: Urine, Catheterized  Result Value Ref Range   Specimen Description URINE, CATHETERIZED    Special Requests NONE    Culture (A)     60,000 COLONIES/mL KLEBSIELLA PNEUMONIAE SUSCEPTIBILITIES TO FOLLOW Performed at [facility], [address] [place], [place] [address]    Report Status PENDING   Lactic acid, plasma     Status: Abnormal   Collection Time: 09/28/21  6:19 AM  Result Value Ref Range   Lactic Acid, Venous 3.9 (HH) 0.5 - 1.9 mmol/L    Comment: CRITICAL VALUE NOTED.  VALUE IS CONSISTENT WITH PREVIOUSLY REPORTED AND CALLED  VALUE. Performed at [facility], [address], [place], [place] [address]   Troponin I (High Sensitivity)     Status: Abnormal   Collection Time: 09/28/21  6:19 AM  Result Value Ref Range  Troponin I (High Sensitivity) 331 (HH) <18 ng/L    Comment: CRITICAL VALUE NOTED.  VALUE IS CONSISTENT WITH PREVIOUSLY REPORTED AND CALLED VALUE. (NOTE) Elevated high sensitivity troponin I (hsTnI) values and significant  changes across serial measurements may suggest ACS but many other  chronic and acute conditions are known to elevate hsTnI results.  Refer to the Links section for chest pain algorithms and additional  guidance. Performed at [facility], [address] [place], [place] [address]   Basic metabolic panel     Status: Abnormal   Collection Time: 09/28/21 10:08 AM  Result Value Ref Range   Sodium 131 (L) 135 - 145 mmol/L   Potassium 5.5 (H) 3.5 - 5.1 mmol/L   Chloride 98 98 - 111 mmol/L   CO2 18 (L) 22 - 32 mmol/L   Glucose, Bld 65 (L) 70 - 99 mg/dL    Comment: Glucose reference range applies only to samples taken after fasting for at least 8 hours.   BUN 63 (H) 8 - 23 mg/dL   Creatinine, Ser 4.78 (H) 0.61 - 1.24 mg/dL   Calcium 9.3 8.9 - 29.5 mg/dL   GFR, Estimated 14 (L) >60 mL/min    Comment: (NOTE) Calculated using the CKD-EPI Creatinine Equation (2021)    Anion gap 15 5 - 15    Comment: Performed at [facility], [address] [place], [place] [address]  DIC Panel ONCE - STAT     Status: Abnormal   Collection Time: 09/28/21 10:08 AM  Result Value Ref Range   Prothrombin Time 16.7 (H) 11.4 - 15.2 seconds   INR 1.4 (H) 0.8 - 1.2    Comment: (NOTE) INR goal varies based on device and disease states.    aPTT 32 24 - 36 seconds   Fibrinogen 775 (H) 210 - 475 mg/dL    Comment: (NOTE) Fibrinogen results may be underestimated in patients receiving thrombolytic therapy.    D-Dimer, Quant >20.00 (H) 0.00 - 0.50 ug/mL-FEU    Comment: (NOTE) At the  manufacturer cut-off value of 0.5 g/mL FEU, this assay has a negative predictive value of 95-100%.This assay is intended for use in conjunction with a clinical pretest probability (PTP) assessment model to exclude pulmonary embolism (PE) and deep venous thrombosis (DVT) in outpatients suspected of PE or DVT. Results should be correlated with clinical presentation.    Platelets 37 (L) 150 - 400 K/uL    Comment: Immature Platelet Fraction may be clinically indicated, consider ordering this additional test [id number] REPEATED TO VERIFY    Smear Review NO SCHISTOCYTES SEEN     Comment: Performed at [facility], [address], [place], [place] [address]  Reticulocytes     Status: Abnormal   Collection Time: 09/28/21 10:08 AM  Result Value Ref Range   Retic Ct Pct 0.7 0.4 - 3.1 %   RBC. 4.00 (L) 4.22 - 5.81 MIL/uL   Retic Count, Absolute 28.8 19.0 - 186.0 K/uL   Immature Retic Fract 23.9 (H) 2.3 - 15.9 %    Comment: Performed at [facility], [address], [place], [place] [address]  Haptoglobin     Status: None   Collection Time: 09/28/21 10:08 AM  Result Value Ref Range   Haptoglobin 267 34 - 355 mg/dL    Comment: (NOTE) Performed At: [facility] [address] [place], [place] [id number] Jolene Schimke MD [id number]   CBC with Differential/Platelet     Status: Abnormal  Collection Time: 10/14/21 11:50 PM  Result Value Ref Range   WBC 5.5 4.0 - 10.5 K/uL   RBC 3.88 (L) 4.22 - 5.81 MIL/uL   Hemoglobin 11.5 (L) 13.0 - 17.0 g/dL   HCT 78.2 (L) 95.6 - 21.3 %   MCV 84.3 80.0 - 100.0 fL   MCH 29.6 26.0 - 34.0 pg   MCHC 35.2 30.0 - 36.0 g/dL   RDW 08.6 57.8 - 46.9 %   Platelets 26 (LL) 150 - 400 K/uL    Comment: Immature Platelet Fraction may be clinically indicated, consider ordering this additional test [id number] REPEATED TO VERIFY PLATELET COUNT CONFIRMED BY SMEAR THIS CRITICAL RESULT HAS VERIFIED AND BEEN CALLED TO RN CHANELL FLETCHER BY PHONG  NGUYEN ON 04 07 2023 AT 0040, AND HAS BEEN READ BACK.     nRBC 0.4 (H) 0.0 - 0.2 %   Neutrophils Relative % 92 %   Neutro Abs 5.1 1.7 - 7.7 K/uL   Lymphocytes Relative 4 %   Lymphs Abs 0.2 (L) 0.7 - 4.0 K/uL   Monocytes Relative 4 %   Monocytes Absolute 0.2 0.1 - 1.0 K/uL   Eosinophils Relative 0 %   Eosinophils Absolute 0.0 0.0 - 0.5 K/uL   Basophils Relative 0 %   Basophils Absolute 0.0 0.0 - 0.1 K/uL   WBC Morphology MILD LEFT SHIFT (1-5% METAS, OCC MYELO, OCC BANDS)     Comment: VACUOLATED NEUTROPHILS   RBC Morphology MORPHOLOGY UNREMARKABLE    Smear Review PLATELETS APPEAR DECREASED    nRBC 2 (H) 0 /100 WBC   Abs Immature Granulocytes 0.00 0.00 - 0.07 K/uL   Dohle Bodies PRESENT     Comment: Performed at [facility], [address], [place], [place] [address]  Comprehensive metabolic panel     Status: Abnormal   Collection Time: 10/14/21 11:50 PM  Result Value Ref Range   Sodium 131 (L) 135 - 145 mmol/L   Potassium 6.0 (H) 3.5 - 5.1 mmol/L   Chloride 100 98 - 111 mmol/L   CO2 18 (L) 22 - 32 mmol/L   Glucose, Bld 59 (L) 70 - 99 mg/dL    Comment: Glucose reference range applies only to samples taken after fasting for at least 8 hours.   BUN 74 (H) 8 - 23 mg/dL   Creatinine, Ser 1.02 (H) 0.61 - 1.24 mg/dL   Calcium 8.9 8.9 - 72.5 mg/dL   Total Protein 6.0 (L) 6.5 - 8.1 g/dL   Albumin 2.3 (L) 3.5 - 5.0 g/dL   AST 366 (H) 15 - 41 U/L   ALT 51 (H) 0 - 44 U/L   Alkaline Phosphatase 97 38 - 126 U/L   Total Bilirubin 0.6 0.3 - 1.2 mg/dL   GFR, Estimated 15 (L) >60 mL/min    Comment: (NOTE) Calculated using the CKD-EPI Creatinine Equation (2021)    Anion gap 13 5 - 15    Comment: Performed at [facility], [address], [place], [place] [address]  Magnesium     Status: None   Collection Time: Oct 14, 2021 11:50 PM  Result Value Ref Range   Magnesium 2.1 1.7 - 2.4 mg/dL    Comment: Performed at [facility], [address], [place], [place] [address]   Comprehensive metabolic panel     Status: Abnormal   Collection Time: 09/29/21  8:26 AM  Result Value Ref Range   Sodium 132 (L) 135 - 145 mmol/L   Potassium 5.6 (H) 3.5 -  5.1 mmol/L   Chloride 102 98 - 111 mmol/L   CO2 16 (L) 22 - 32 mmol/L   Glucose, Bld 77 70 - 99 mg/dL    Comment: Glucose reference range applies only to samples taken after fasting for at least 8 hours.   BUN 81 (H) 8 - 23 mg/dL   Creatinine, Ser 1.61 (H) 0.61 - 1.24 mg/dL   Calcium 9.0 8.9 - 09.6 mg/dL   Total Protein 5.9 (L) 6.5 - 8.1 g/dL   Albumin 2.3 (L) 3.5 - 5.0 g/dL   AST 94 (H) 15 - 41 U/L   ALT 48 (H) 0 - 44 U/L   Alkaline Phosphatase 94 38 - 126 U/L   Total Bilirubin 0.9 0.3 - 1.2 mg/dL   GFR, Estimated 15 (L) >60 mL/min    Comment: (NOTE) Calculated using the CKD-EPI Creatinine Equation (2021)    Anion gap 14 5 - 15    Comment: Performed at [facility], [address], [place], [place] [address]  CBC     Status: Abnormal   Collection Time: 09/29/21  8:26 AM  Result Value Ref Range   WBC 5.9 4.0 - 10.5 K/uL   RBC 3.99 (L) 4.22 - 5.81 MIL/uL   Hemoglobin 11.4 (L) 13.0 - 17.0 g/dL   HCT 98.1 (L) 19.1 - 47.8 %   MCV 85.7 80.0 - 100.0 fL   MCH 28.6 26.0 - 34.0 pg   MCHC 33.3 30.0 - 36.0 g/dL   RDW 29.5 62.1 - 30.8 %   Platelets 23 (LL) 150 - 400 K/uL    Comment: Immature Platelet Fraction may be clinically indicated, consider ordering this additional test [id number] CONSISTENT WITH PREVIOUS RESULT REPEATED TO VERIFY THIS CRITICAL RESULT HAS VERIFIED AND BEEN CALLED TO LAURA COX RN BY BERTRAM TAYLOR ON 04 07 2023 AT 0855, AND HAS BEEN READ BACK.     nRBC 0.5 (H) 0.0 - 0.2 %    Comment: Performed at [facility], [address] [place], [place] [address]  Blood gas, arterial     Status: Abnormal   Collection Time: 09/29/21 10:54 AM  Result Value Ref Range   pH, Arterial 7.25 (L) 7.35 - 7.45   pCO2 arterial 38 32 - 48 mmHg   pO2, Arterial 73 (L) 83 - 108 mmHg   Bicarbonate 16.7  (L) 20.0 - 28.0 mmol/L   Acid-base deficit 9.8 (H) 0.0 - 2.0 mmol/L   O2 Saturation 94.5 %   Patient temperature 37.0    Collection site LEFT RADIAL    Drawn by [id number]    Allens test (pass/fail) PASS PASS    Comment: Performed at [facility], [address] [place], [place] [address]    DG CHEST PORT 1 VIEW  Result Date: 09/29/2021 CLINICAL DATA:  76 year old male with a history of shortness of breath EXAM: PORTABLE CHEST 1 VIEW COMPARISON:  09/27/2021 FINDINGS: Cardiomediastinal silhouette unchanged. Double density projecting over the lower mediastinum is unchanged. Low lung volumes persist. Increased patchy opacities in the right upper lung right lower lung, left mid lung overlying the left heart border. No pneumothorax. No large pleural effusion. IMPRESSION: Increasing patchy airspace opacities, concerning for multifocal pneumonia. Followup PA and lateral chest X-ray is recommended in 3-4 weeks following trial of therapy to assure resolution. Hiatal hernia Electronically Signed   By: Gilmer Mor D.O.   On: 09/29/2021 12:18   DG Chest Portable 1 View  Result Date: 09/27/2021 CLINICAL DATA:  Weakness.  Fever yesterday. EXAM: PORTABLE CHEST 1 VIEW COMPARISON:  Radiograph 12/06/2019 FINDINGS: Lung volumes are low limiting assessment. Ill-defined and streaky opacity in the right upper lung zone. Heart size grossly stable. There is a large hiatal hernia that is suboptimally assessed on this portable exam. No pleural effusion, pneumothorax, or pulmonary edema. Scoliosis. IMPRESSION: 1. Low lung volumes with streaky opacity in the right upper lung zone, suspicious for pneumonia. 2. Large hiatal hernia, suboptimally assessed on this portable exam. Electronically Signed   By: Narda Rutherford M.D.   On: 10-14-2021 18:25   DG C-Arm 1-60 Min-No Report  Result Date: 09/28/2021 Fluoroscopy was utilized by the requesting physician.  No radiographic interpretation.   CT RENAL STONE STUDY  Result  Date: 09/28/2021 CLINICAL DATA:  Bladder neck obstruction. EXAM: CT ABDOMEN AND PELVIS WITHOUT CONTRAST TECHNIQUE: Multidetector CT imaging of the abdomen and pelvis was performed following the standard protocol without IV contrast. RADIATION DOSE REDUCTION: This exam was performed according to the departmental dose-optimization program which includes automated exposure control, adjustment of the mA and/or kV according to patient size and/or use of iterative reconstruction technique. COMPARISON:  CT without contrast 12/05/2019 FINDINGS: Lower chest: There is a small layering right pleural effusion with adjacent atelectasis or consolidation in the posterior basal right lower lobe. Lung bases are otherwise clear. Sizable herniation of the posteromedial left hemidiaphragm is again noted with inverted intrathoracic stomach. There is mild cardiomegaly with calcific CAD. Hepatobiliary: 18 cm length slightly steatotic liver. There is a calcified granuloma in the right lobe inferiorly. No other focal abnormality is seen without contrast. There are few stones in the gallbladder but no wall thickening or biliary dilatation. Pancreas: Unremarkable without contrast. Spleen: Unremarkable without contrast. Adrenals/Urinary Tract: No focal abnormality of the adrenal glands, unenhanced renal cortex. There is perinephric stranding on the right-greater-than-left, which was greater previously. No intrarenal stone is seen either side but on the right, there is a proximal ureteral stone measuring 6 x 4 x 5 mm associated with mild hydronephrosis and superimposing over the proximal right L4 transverse process. Both of the ureters are otherwise clear. The bladder is catheterized contracted, appears mildly thickened in general. Apparently there is a specific question as to possible bladder neck obstruction. This is not well evaluated without contrast. If there is a bladder outlet problem it is probably due to the enlarged prostate which  measures 5.1 cm transversely. 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 There is no small bowel dilatation or inflammatory change. The appendix is normal. There are colonic diverticula without evidence of acute colitis or diverticulitis. Vascular/Lymphatic: Heavy aortoiliac atherosclerosis. 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 No adenopathy is seen. Reproductive: Enlarged prostate 5.1 cm transverse, similar to prior study with prominent calcifications posteriorly. Other: There is mild fluid in the right pararenal space and right paracolic gutter, trace fluid in the posterior pelvis. No free air, hemorrhage or abscess. Musculoskeletal: There is moderate to severe lumbar levorotary scoliosis with advanced lumbar degenerative changes, similar to prior study. IMPRESSION: 1. Bladder neck not well evaluated without contrast. The prostate is prominent measuring 5.1 cm transverse and there is probably a chronic bladder outlet issue related to this, with the bladder currently catheterized and at least mildly thickened in general, probably due to hypertrophy. 2. 6 x 4 x 5 mm proximal right ureteral stone with mild hydronephrosis. Mild  fluid in the right pararenal space and right paracolic gutter is concerning for urine leakage. 3. No intrarenal stones either side. 4. Diverticulosis without diverticulitis. 5. Small right pleural effusion with adjacent consolidation or atelectasis in the right lower lobe. 6. 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 7. Cholelithiasis. 8. 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 Follow-up re-evaluation recommended in 5 years. Reference: J Am Coll Radiol 2013;10:789-794. Electronically Signed   By: Almira Bar M.D.   On: Oct 26, 2021  04:43    Assessment/Plan   AKI: last known Cr 1.01 2021 in Epic.  Presenting with AKI in the setting of obstructing stone and sepsis.  Making some urine but not a whole lot - keep Foley - urology following s/p stone removal - in setting of increasing O2 requirements- stop continuous IVFs, one 25 g dose of IV albumin  - add midodrine 10 TID to help augment BP and improve renal perfusion - treat K with lokelma BID - avoid nephrotoxins, IV contrast as able, fleets enemas, mag citrate - no indications for HD now since improving and making some urine but need to watch closely  2.  Severe sepsis 2/2 Klebsiella bacteremia - getting ceftriaxone per primary  3.  Hyperkalemia: - lokelma BID  4.  Hyponatremia: - likely d/t impaired free water excretion with AKI - adding on AM cortisol esp since has hyperkalemia too  5.  Thrombocytopenia: - likely d/t sepsis, no hemolytic process identified, heme consulted  6.  Acute hypoxic RF: - aspiration component, O2 requirement increasing after IVFs - stop continuous IVFs, albumin 25 g dose x 1 - ceftriaxone and Flagyl per primary  7.  Dispo: inpt  Bufford Buttner 09/29/2021, 4:03 PM

## 2021-09-30 ENCOUNTER — Inpatient Hospital Stay (HOSPITAL_COMMUNITY): Payer: Medicare HMO

## 2021-09-30 DIAGNOSIS — I5023 Acute on chronic systolic (congestive) heart failure: Secondary | ICD-10-CM | POA: Diagnosis present

## 2021-09-30 DIAGNOSIS — N170 Acute kidney failure with tubular necrosis: Secondary | ICD-10-CM | POA: Diagnosis not present

## 2021-09-30 DIAGNOSIS — A419 Sepsis, unspecified organism: Secondary | ICD-10-CM | POA: Diagnosis not present

## 2021-09-30 DIAGNOSIS — E872 Acidosis, unspecified: Secondary | ICD-10-CM | POA: Diagnosis present

## 2021-09-30 DIAGNOSIS — J9601 Acute respiratory failure with hypoxia: Secondary | ICD-10-CM | POA: Diagnosis present

## 2021-09-30 DIAGNOSIS — M545 Low back pain, unspecified: Secondary | ICD-10-CM | POA: Diagnosis present

## 2021-09-30 DIAGNOSIS — E871 Hypo-osmolality and hyponatremia: Secondary | ICD-10-CM | POA: Diagnosis present

## 2021-09-30 DIAGNOSIS — R6520 Severe sepsis without septic shock: Secondary | ICD-10-CM | POA: Diagnosis not present

## 2021-09-30 DIAGNOSIS — B961 Klebsiella pneumoniae [K. pneumoniae] as the cause of diseases classified elsewhere: Secondary | ICD-10-CM | POA: Diagnosis present

## 2021-09-30 DIAGNOSIS — R7881 Bacteremia: Secondary | ICD-10-CM | POA: Diagnosis present

## 2021-09-30 DIAGNOSIS — E875 Hyperkalemia: Secondary | ICD-10-CM | POA: Diagnosis present

## 2021-09-30 DIAGNOSIS — Z7189 Other specified counseling: Secondary | ICD-10-CM

## 2021-09-30 DIAGNOSIS — Z515 Encounter for palliative care: Secondary | ICD-10-CM

## 2021-09-30 LAB — CULTURE, BLOOD (ROUTINE X 2)
Special Requests: ADEQUATE
Special Requests: ADEQUATE

## 2021-09-30 LAB — CBC WITH DIFFERENTIAL/PLATELET
Abs Immature Granulocytes: 0.18 10*3/uL — ABNORMAL HIGH (ref 0.00–0.07)
Basophils Absolute: 0 10*3/uL (ref 0.0–0.1)
Basophils Relative: 0 %
Eosinophils Absolute: 0 10*3/uL (ref 0.0–0.5)
Eosinophils Relative: 0 %
HCT: 30.2 % — ABNORMAL LOW (ref 39.0–52.0)
Hemoglobin: 10.5 g/dL — ABNORMAL LOW (ref 13.0–17.0)
Immature Granulocytes: 4 %
Lymphocytes Relative: 5 %
Lymphs Abs: 0.3 10*3/uL — ABNORMAL LOW (ref 0.7–4.0)
MCH: 29.7 pg (ref 26.0–34.0)
MCHC: 34.8 g/dL (ref 30.0–36.0)
MCV: 85.6 fL (ref 80.0–100.0)
Monocytes Absolute: 0.5 10*3/uL (ref 0.1–1.0)
Monocytes Relative: 10 %
Neutro Abs: 3.7 10*3/uL (ref 1.7–7.7)
Neutrophils Relative %: 81 %
Platelets: 20 10*3/uL — CL (ref 150–400)
RBC: 3.53 MIL/uL — ABNORMAL LOW (ref 4.22–5.81)
RDW: 14.7 % (ref 11.5–15.5)
Smear Review: INCREASED
WBC: 4.7 10*3/uL (ref 4.0–10.5)
nRBC: 1.1 % — ABNORMAL HIGH (ref 0.0–0.2)

## 2021-09-30 LAB — COMPREHENSIVE METABOLIC PANEL
ALT: 41 U/L (ref 0–44)
AST: 67 U/L — ABNORMAL HIGH (ref 15–41)
Albumin: 2.4 g/dL — ABNORMAL LOW (ref 3.5–5.0)
Alkaline Phosphatase: 85 U/L (ref 38–126)
Anion gap: 13 (ref 5–15)
BUN: 98 mg/dL — ABNORMAL HIGH (ref 8–23)
CO2: 19 mmol/L — ABNORMAL LOW (ref 22–32)
Calcium: 9 mg/dL (ref 8.9–10.3)
Chloride: 101 mmol/L (ref 98–111)
Creatinine, Ser: 3.83 mg/dL — ABNORMAL HIGH (ref 0.61–1.24)
GFR, Estimated: 15 mL/min — ABNORMAL LOW (ref >60)
Glucose, Bld: 79 mg/dL (ref 70–99)
Potassium: 5.3 mmol/L — ABNORMAL HIGH (ref 3.5–5.1)
Sodium: 133 mmol/L — ABNORMAL LOW (ref 135–145)
Total Bilirubin: 1 mg/dL (ref 0.3–1.2)
Total Protein: 5.9 g/dL — ABNORMAL LOW (ref 6.5–8.1)

## 2021-09-30 LAB — CORTISOL-AM, BLOOD: Cortisol - AM: 36.8 ug/dL — ABNORMAL HIGH (ref 6.7–22.6)

## 2021-09-30 LAB — URINE CULTURE: Culture: 60000 — AB

## 2021-09-30 LAB — MAGNESIUM: Magnesium: 2.3 mg/dL (ref 1.7–2.4)

## 2021-09-30 MED ORDER — LEVALBUTEROL HCL 0.63 MG/3ML IN NEBU
0.6300 mg | INHALATION_SOLUTION | Freq: Four times a day (QID) | RESPIRATORY_TRACT | Status: DC | PRN
  Administered 2021-09-30 – 2021-10-01 (×4): 0.63 mg via RESPIRATORY_TRACT
  Filled 2021-09-30 (×4): qty 3

## 2021-09-30 MED ORDER — ALBUMIN HUMAN 25 % IV SOLN
25.0000 g | Freq: Once | INTRAVENOUS | Status: AC
  Administered 2021-09-30: 25 g via INTRAVENOUS
  Filled 2021-09-30: qty 100

## 2021-09-30 MED ORDER — IPRATROPIUM BROMIDE 0.02 % IN SOLN
0.5000 mg | Freq: Four times a day (QID) | RESPIRATORY_TRACT | Status: DC
  Administered 2021-09-30 – 2021-10-01 (×6): 0.5 mg via RESPIRATORY_TRACT
  Filled 2021-09-30 (×6): qty 2.5

## 2021-09-30 MED ORDER — NEPRO/CARBSTEADY PO LIQD
237.0000 mL | Freq: Three times a day (TID) | ORAL | Status: DC
  Administered 2021-09-30 – 2021-10-01 (×4): 237 mL via ORAL

## 2021-09-30 MED ORDER — SODIUM ZIRCONIUM CYCLOSILICATE 10 G PO PACK
10.0000 g | PACK | Freq: Three times a day (TID) | ORAL | Status: DC
  Administered 2021-09-30 (×2): 10 g via ORAL
  Filled 2021-09-30 (×3): qty 1

## 2021-09-30 MED ORDER — OXYCODONE HCL 5 MG PO TABS
5.0000 mg | ORAL_TABLET | ORAL | Status: DC | PRN
  Administered 2021-09-30 – 2021-10-01 (×6): 5 mg via ORAL
  Filled 2021-09-30 (×5): qty 1

## 2021-09-30 NOTE — Assessment & Plan Note (Addendum)
with hyperkalemia, hyponatremia and hypocalcemia. ?Mostly combination of ATN with obstructive uropathy. ?Metabolic acidosis ?Patient has a Foley catheter as well as stent placement which should treat acute kidney will continue. ?But suspect that the patient is suffering from dense ATN in the setting of sepsis. ?BUN significantly worsening despite improvement in serum creatinine. ?Urine output more than a liter in last 24 hours. ?Renal dosing. ?Hyperkalemia improving.   ?On bicarb therapy ?

## 2021-09-30 NOTE — Assessment & Plan Note (Signed)
Currently rate controlled although intermittently goes with RVR. ?At present limited room for further rate control medication. ?Definitely not a candidate for anticoagulation given his severe thrombocytopenia. ?Currently receiving midodrine.  Will monitor. ?

## 2021-09-30 NOTE — Assessment & Plan Note (Signed)
Likely in the setting of gram-negative sepsis. ?Work-up so far negative for any DIC or TTP or HUS like panel. ?Monitor for now. ?Appreciate hematology consultation. ?

## 2021-09-30 NOTE — Assessment & Plan Note (Signed)
Likely in the setting of sepsis and congestion. ?Monitor for now. ?

## 2021-09-30 NOTE — Assessment & Plan Note (Signed)
Echocardiogram this admission shows 40 to 45% EF with global hypokinesis this is likely due to severe sepsis. ?RV also moderately enlarged. ?Monitor closely.  Renders poor prognosis. ?

## 2021-09-30 NOTE — Progress Notes (Signed)
?Progress Note ?Patient: Aaron Klein VCB:[id number] DOB: 11-30-1945 DOA: 10/09/2021  ?DOS: the patient was seen and examined on 09/30/2021 ? ?Brief hospital course: ?Aaron Klein is a 76 y.o. male with history of glotticcancerinremission GERD, low back pain, hyperlipidemia presents to the ER at med center with complaints of having feeling weakness for the last few days.  ?Found to have gram-negative sepsis with Klebsiella bacteremia most likely secondary to UTI as well as acute kidney injury and bicytopenia as well as acute on chronic hypoxic respiratory failure in the setting of aspiration pneumonia. ?EKG also shows new onset atrial flutter likely in response to respiratory distress and elevated troponin. ?Nephrology, urology, hematology and palliative care consulted. ? ?Assessment and Plan: ?* Severe sepsis ([place]) ?secondary to aspiration pneumonia and UTI, present on admission ?Klebsiella bacteremia ?Acute hypoxic respiratory failure, present on admission ?Presents with complaints of fatigue. ?Met SIRS criteria on admission with fever, leukocytosis, hypotension and tachycardia. ?Started on IV antibiotics. ?Blood cultures came back positive for Klebsiella. ?CT scan shows evidence of hydronephrosis. ?Urology was consulted underwent stent placement. ?Urine culture also growing gram-negative rods. ?Chest x-ray shows multifocal pneumonia. ?Requiring 2 L of oxygen on admission currently on 9 L of oxygen. ?ABG shows no evidence of hypercarbia but shows severe hypoxia. ?We will use BiPAP as needed. ?Monitor. ? ?AKI (acute kidney injury) ([place]) ?with hyperkalemia, hyponatremia and hypocalcemia. ?Mostly combination of ATN with obstructive uropathy. ?Metabolic acidosis ?Patient has a Foley catheter as well as stent placement which should treat acute kidney will continue. ?But suspect that the patient is suffering from dense ATN in the setting of sepsis.  Urine output 800 mL in last 24 hours. ?Renal dosing. ?Hyperkalemia  currently being treated with Lokelma. ?On bicarb therapy ? ?Typical atrial flutter ([place]) ?Currently rate controlled although intermittently goes with RVR. ?At present limited room for further rate control medication. ?Definitely not a candidate for anticoagulation given his severe thrombocytopenia. ?Currently receiving midodrine.  Will monitor. ? ?Thrombocytopenia ([place]) ?Likely in the setting of gram-negative sepsis. ?Work-up so far negative for any DIC or TTP or HUS like panel. ?Monitor for now. ?Appreciate hematology consultation. ? ?Goals of care, counseling/discussion ?Appreciate palliative care assistance. ?Multiple discussion with the family with regards to patient's current critical condition in the setting of multiorgan failure and patient's only primary complaint of severe low back pain.  Patient has been back-and-forth with limited code and full code. ?Now after conversation with palliative care team patient is back to partial code. ?Pain control is attempted with the use of oral oxycodone and lidocaine patch.  Limitation will be encephalopathy as well as hypotension. ? ? ?Systolic dysfunction with acute on chronic heart failure ([place]) ?Echocardiogram this admission shows 40 to 45% EF with global hypokinesis this is likely due to severe sepsis. ?RV also moderately enlarged. ?Monitor closely.  Renders poor prognosis. ? ?Elevated LFTs ?Likely in the setting of sepsis and congestion. ?Monitor for now. ? ?Subjective: Continues to complain of low back pain.  No nausea no vomiting no fever no chills.  Frustrated with lack of pain control.  Attempts to guide the conversation with regards to his medical condition not successful. ? ?Physical Exam: ?Vitals:  ? 09/30/21 0653 09/30/21 0700 09/30/21 1703 09/30/21 1756  ?BP: (!) 115/99   119/85  ?Pulse: 82 63  (!) 104  ?Resp: 16   18  ?Temp: 98 ?F (36.7 ?C)     ?TempSrc: Oral     ?SpO2: 96% 95% 96% 95%  ?Weight:      ?  Height:      ? ?General: Appear in marked distress;  no visible Abnormal Neck Mass Or lumps, Conjunctiva normal ?Cardiovascular: S1 and S2 Present, aortic systolic  Murmur, ?Respiratory: increased respiratory effort, Bilateral Air entry present and  bilateral  Crackles, Occasional  wheezes ?Abdomen: Bowel Sound present, Non tender  ?Extremities: no Pedal edema ?Neurology: alert and oriented to time, place, and person ?Gait not checked due to patient safety concerns  ? ?Data Reviewed: ?I have Reviewed nursing notes, Vitals, and Lab results since pt's last encounter. Pertinent lab results CBC and renal function panel ?I have ordered test including CBC and renal function panel ?I have ordered imaging studies x-ray pelvis. ?I have discussed pt's care plan and test results with nephrology and palliative care.  ? ?Family Communication: Brother at bedside. ? ?Disposition: ?Status is: Inpatient ?Remains inpatient appropriate because: Ongoing multiorgan failure and severe sepsis.  Ongoing discussion of goals of care. ?Author: ?Berle Mull, MD ?09/30/2021 8:35 PM ? ?For on call review [email or web address]. ?

## 2021-09-30 NOTE — Assessment & Plan Note (Addendum)
secondary to aspiration pneumonia and UTI, present on admission ?Klebsiella bacteremia ?Acute hypoxic respiratory failure, present on admission ?Presents with complaints of fatigue. ?Met SIRS criteria on admission with fever, leukocytosis, hypotension and tachycardia. ?Started on IV antibiotics. ?Blood cultures came back positive for Klebsiella. ?CT scan shows evidence of hydronephrosis. ?Urology was consulted underwent stent placement. ?Urine culture also growing gram-negative rods. ?Chest x-ray shows multifocal pneumonia. ?Requiring 2 L of oxygen on admission currently on 9 L of oxygen. ?ABG shows no evidence of hypercarbia but shows severe hypoxia. ?We will use BiPAP as needed. ?

## 2021-09-30 NOTE — Progress Notes (Signed)
BIPAP not needed at this time. Patient on [place] HFNC 7lpm. RT will monitor as needed. ?

## 2021-09-30 NOTE — Consult Note (Signed)
?Palliative Medicine Inpatient Consult Note ? ?Consulting Provider: Lavina Hamman, MD ? ?Reason for consult:   ?Aaron Klein Palliative Medicine Consult  ?Reason for Consult? goals of care conversations  ? ?HPI:  ?Per intake H&P --> Aaron Klein is a 76 y.o. 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 Found to have gram-negative sepsis with Klebsiella bacteremia most likely secondary to UTI as well as acute kidney injury and bicytopenia as well as acute on chronic hypoxic respiratory failure in the setting of aspiration pneumonia.EKG also shows new onset atrial flutter likely in response to respiratory distress and elevated troponin. Nephrology, urology, hematology and palliative care consulted. ? ?Palliative care asked to get involved to further address goals of care in the setting of acute on chronic illness.  ? ?Clinical Assessment/Goals of Care: ? ?*Please note that this is a verbal dictation therefore any spelling or grammatical errors are due to the "[place] [employer]" system interpretation. ? ?I have reviewed medical records including EPIC notes, labs and imaging, received report from bedside RN, assessed the patient who is lying in bed, sleeping.  ?  ?I met with Aaron Klein (brother) and patients girlfriend Aaron Klein to further discuss diagnosis prognosis, Aaron Klein, Aaron Klein wishes, disposition and options. ?  ?I introduced Palliative Medicine as specialized medical care for people living with serious illness. It focuses on providing relief from the symptoms and stress of a serious illness. The goal is to improve quality of life for both the patient and the family. ? ?Medical History Review and Understanding: ? ?Discussed Aaron Klein history of chronic lower back pain in the setting of scoliosis, prior Glottic cancer, atrial flutter, and GERD. ? ?Reviewed  patients hospitalization which has been problematic in the setting of multifocal pneumonia, urinary infections, kidney injury and thrombocytopenia from sepsis.  ? ?Discussed that patient was ambivalent to get medical care contributing to worsening of symptoms they believe.  ? ?Social History: ? ?Aaron Klein is from [place], [place]. He has been married twice though is divorced. He has a long time (sixteen years) girlfriend, Aaron Klein. He has one son who lived in [place], [place] with whom he has a good relationship. He worked for [employer] for many years. He loves fishing, hunting, and bowling. He and his brother go bowling twice weekly.  ? ?Functional and Nutritional State: ? ?Aaron Klein was fully functional prior to admission completing all bADL's and iADL's on his own. ? ?Appetite was very good prior to admission.  ? ?Advance Directives: ? ?A detailed discussion was had today regarding advanced directives. Patients brother shares that he does have advance directives though he does not know where they are presently.  ? ?Code Status: ? ?Per Aaron Klein had always wishes to not be resuscitated. He recently changed his mind though in the last day to be a full code. I educated Aaron Klein that the outcomes for patients with 2(+) co-morbidities are quite poor for resuscitation.  ? ?Provided "Hard Choices for Loving People" booklet.  ? ?Discussion: ? ?Reviewed patients multiple organ dysfunction from sepsis. I shared my concern that patient may not turn the corner despite the medical teams best efforts. Provided best case worst case scenarios. I shared with family that unfortunately, given the shock Aaron Klein's has endured it's important to consider further what he would want should be continue to decline. ? ?Aaron Klein was sleeping therefore we identified  that I would come back later in the day for additional conversations with him.  ? ?Discussed the importance of continued conversation with family and their  medical providers  regarding overall plan of care and treatment options, ensuring decisions are within the context of the patients values and GOCs. ?______________________________________ ?Addendum: ? ?I met with Aaron Klein this later afternoon as he was more awake. We were able to discuss in greater detail the concerns in terms of his sepsis. He initially said, " I don't know what the hell is going on." He then re-oriented we discussed that he came into the hospital in the setting of pneumonia and a urinary infection from a uretal stone. We reviewed that he had a stent placed for the stone. Reviewed that he remains quite ill as his kidneys, heart, lungs and blood have all been affected by this severe infection.  ? ?I shared that the medical team(s) ae quite concerned about Aaron Klein present condition right now as he is in a tenuous state. ? ?A formal review of code status was held. Aaron Klein has agreed that he would not want chest compressions or intubation if they were not going to be of value though he would desire to have a trial of Bipap, ACLS medications, and cardiac shocks if his rhythm were reversible. Patients two brothers and girlfriend were present and in agreement with this. ? ?In the meanwhile patients goals are to "get better" so they he can resume his prior level of function. We discussed that we can certainly hope for that but also realize he may not improve as he hopes.  ? ?Decision Maker: ?Aaron Klein (brother): [phone] ? ?SUMMARY OF RECOMMENDATIONS   ?Partial Code - No Chest Compressions or intubation would want shocks, ACLS medications and Bipap ? ?MOST and Advance Directives provided for review and completion  ? ?Watchful waiting for improvements ? ?Ongoing support ? ?Code Status/Advance Care Planning: ?Partial Code - No Chest Compressions or intubation would want shocks, ACLS medications and Bipap ?  ?Palliative Prophylaxis:  ?Aspiration, Bowel Regimen, Delirium Protocol, Frequent Pain Assessment, Oral Care,  Palliative Wound Care, and Turn Reposition ? ?Additional Recommendations (Limitations, Scope, Preferences): ?Treat what is treatable ? ?Psycho-social/Spiritual:  ?Desire for further Chaplaincy support: Yes ?Additional Recommendations: Education on acute sepsis and the impact on organ function ?  ?Prognosis: Worrisome in the setting of cardiac, pulmonary, kidney, and hematologic impact of sepsis. ? ?Discharge Planning: Unclear ? ?Vitals:  ? 09/30/21 0653 09/30/21 0700  ?BP: (!) 115/99   ?Pulse: 82 63  ?Resp: 16   ?Temp: 98 ?F (36.7 ?C)   ?SpO2: 96% 95%  ? ? ?Intake/Output Summary (Last 24 hours) at 09/30/2021 1219 ?Last data filed at 09/30/2021 0600 ?Gross per 24 hour  ?Intake 800 ml  ?Output 800 ml  ?Net 0 ml  ? ?Last Weight  Most recent update: 09/29/2021  4:01 AM  ? ? Weight  ?78.6 kg (173 lb 3.2 oz)  ?      ? ?  ? ?Gen:  Elderly caucasian M in NAD ?HEENT: moist mucous membranes ?CV: Regular rate and irregular rhythm ?PULM: 6LPM [place], tachypneic ?ABD: soft/nontender ?EXT: No edema ?Neuro: Alert and oriented x3 ? ?PPS: 60% ? ? ?This conversation/these recommendations were discussed with patient primary care team, Dr. Posey Pronto ? ?Total Time: 93 ? ?MDM High ?______________________________________________________ ?Tacey Ruiz ?[facility] Team ?Team Cell Phone: [phone] ?Please utilize secure chat with additional questions, if there is no response within 30 minutes please call the above  phone number ? ?Palliative Medicine Team providers are available by phone from 7am to 7pm daily and can be reached through the team cell phone.  ?Should this patient require assistance outside of these hours, please call the patient's attending physician. ? ? ?

## 2021-09-30 NOTE — Assessment & Plan Note (Addendum)
Appreciate palliative care assistance. ?Multiple discussion with the family with regards to patient's current critical condition in the setting of multiorgan failure and patient's only primary complaint of severe low back pain.  Patient has been back-and-forth with limited code and full code. ?Now after conversation with palliative care team patient is back to partial code. ?Pain control is attempted with the use of oral oxycodone and lidocaine patch.  Limitation will be encephalopathy as well as hypotension. ?

## 2021-09-30 NOTE — Progress Notes (Signed)
?[facility] ?Progress Note  ? ? ?Assessment/ Plan:   ? AKI: last known Cr 1.01 2021 in [place].  Presenting with AKI in the setting of obstructing stone and sepsis.  Making some urine but not a whole lot ?- keep Foley ?- urology following s/p stone removal ?- in setting of increasing O2 requirements- stop continuous IVFs, one 25 g dose of IV albumin- one more today  ?- added midodrine 10 TID to help augment BP and improve renal perfusion ?- treat K with lokelma BID, increase to TID ?- avoid nephrotoxins, IV contrast as able, fleets enemas, mag citrate ?- no indications for HD now since improving and making some urine but need to watch closely--> UOP 800 mL yesterday ?  ?2.  Severe sepsis 2/2 Klebsiella bacteremia ?- getting ceftriaxone per primary ?  ?3.  Hyperkalemia: ?- lokelma BID--> TID ?  ?4.  Hyponatremia: ?- likely d/t impaired free water excretion with AKI ?- adding on AM cortisol--> appropriately elevated ?  ?5.  Thrombocytopenia: ?- likely d/t sepsis, no hemolytic process identified, heme consulted ?  ?6.  Acute hypoxic RF: ?- aspiration component, O2 requirement increasing after IVFs ?- stop continuous IVFs, albumin 25 g dose x 1 ?- ceftriaxone and Flagyl per primary ?  ?7.  Dispo: inpt.  He has MSOF and I agree with palliative care c/s.  Discussed briefly with brother and SO. ? ?Subjective:   ? ?Seen in room- looks miserable- he has back pain and is really uncomfortable. Brother and SO at the bedside again.    ? ?Objective:   ?BP (!) 115/99 (BP Location: Right Arm)   Pulse 63   Temp 98 ?F (36.7 ?C) (Oral)   Resp 16   Ht '5\' 9"'$  (1.753 m)   Wt 78.6 kg   SpO2 95%   BMI 25.58 kg/m?  ? ?Intake/Output Summary (Last 24 hours) at 09/30/2021 1208 ?Last data filed at 09/30/2021 0600 ?Gross per 24 hour  ?Intake 800 ml  ?Output 800 ml  ?Net 0 ml  ? ?Weight change:  ? ?Physical Exam: ?TKZ:SWFUX on R side, moaning ?CVS: tachycardic ?Resp: some crackles ?Abd: soft ?GU: Foley in place ?Ext: no LE  edema ? ?Imaging: ?DG Pelvis Portable ? ?Result Date: 09/30/2021 ?CLINICAL DATA:  Low back pain EXAM: PORTABLE PELVIS 1-2 VIEWS COMPARISON:  Abdominal CT from 2 years ago FINDINGS: There is no evidence of pelvic fracture or diastasis. No pelvic bone lesions are seen. Both hips are located and show no significant degenerative change. Partially covered lumbar scoliosis and degeneration. Enteric contrast and right ureteral stone. IMPRESSION: Negative pelvis and hips. Electronically Signed   By: Jorje Guild M.D.   On: 09/30/2021 09:32  ? ?DG CHEST PORT 1 VIEW ? ?Result Date: 09/29/2021 ?CLINICAL DATA:  76 year old male with a history of shortness of breath EXAM: PORTABLE CHEST 1 VIEW COMPARISON:  10/14/2021 FINDINGS: Cardiomediastinal silhouette unchanged. Double density projecting over the lower mediastinum is unchanged. Low lung volumes persist. Increased patchy opacities in the right upper lung right lower lung, left mid lung overlying the left heart border. No pneumothorax. No large pleural effusion. IMPRESSION: Increasing patchy airspace opacities, concerning for multifocal pneumonia. Followup PA and lateral chest X-ray is recommended in 3-4 weeks following trial of therapy to assure resolution. Hiatal hernia Electronically Signed   By: Corrie Mckusick D.O.   On: 09/29/2021 12:18  ? ?DG C-Arm 1-60 Min-No Report ? ?Result Date: 10/06/2021 ?Fluoroscopy was utilized by the requesting physician.  No radiographic interpretation.  ? ?  ECHOCARDIOGRAM COMPLETE ? ?Result Date: 09/29/2021 ?   ECHOCARDIOGRAM REPORT   Patient Name:   NAZAIRE CORDIAL Danielsen Date of Exam: 09/29/2021 Medical Rec #:  [id number]      Height:       69.0 in Accession #:    [id number]     Weight:       173.2 lb Date of Birth:  1946-05-05     BSA:          1.943 m? Patient Age:    76 years       BP:           104/84 mmHg Patient Gender: M              HR:           102 bpm. Exam Location:  Inpatient Procedure: 2D Echo, Cardiac Doppler, Color Doppler and Intracardiac             Opacification Agent Indications:    R06.02 SOB  History:        Patient has no prior history of Echocardiogram examinations.                 Abnormal ECG, Arrythmias:Atrial Fibrillation,                 Signs/Symptoms:Bacteremia; Risk Factors:Hypertension and Sleep                 Apnea. Cancer.  Sonographer:    Roseanna Rainbow RDCS Referring Phys: [id number] [facility] M PATEL  Sonographer Comments: Technically difficult study due to poor echo windows and no apical window. No true apical, images medial or from subcostal window. Parasternal images very low IMPRESSIONS  1. Left ventricular ejection fraction, by estimation, is 40 to 45%. The left ventricle has mildly decreased function. The left ventricle demonstrates global hypokinesis. There is mild concentric left ventricular hypertrophy. Left ventricular diastolic function could not be evaluated.  2. Right ventricular systolic function was not well visualized. The right ventricular size is moderately enlarged. There is moderately elevated pulmonary artery systolic pressure.  3. The mitral valve is abnormal. Trivial mitral valve regurgitation. No evidence of mitral stenosis. Moderate mitral annular calcification.  4. Tricuspid valve regurgitation is mild to moderate.  5. The aortic valve is grossly normal. There is mild calcification of the aortic valve. There is mild thickening of the aortic valve. Aortic valve regurgitation is trivial. Aortic valve sclerosis is present, with no evidence of aortic valve stenosis.  6. Aortic dilatation noted. There is borderline dilatation of the ascending aorta, measuring 38 mm.  7. The inferior vena cava is dilated in size with <50% respiratory variability, suggesting right atrial pressure of 15 mmHg. Comparison(s): No prior Echocardiogram. Conclusion(s)/Recommendation(s): Limited windows, even with use of echo contrast. LVEF mildly reduced with global hypokinesis (perhaps slightly more prominent in the septum). RV moderately enlarged  with elevated RVSP of 49 mmHg. FINDINGS  Left Ventricle: Left ventricular ejection fraction, by estimation, is 40 to 45%. The left ventricle has mildly decreased function. The left ventricle demonstrates global hypokinesis. Definity contrast agent was given IV to delineate the left ventricular  endocardial borders. The left ventricular internal cavity size was normal in size. There is mild concentric left ventricular hypertrophy. Left ventricular diastolic function could not be evaluated due to atrial fibrillation. Left ventricular diastolic function could not be evaluated. Right Ventricle: The right ventricular size is moderately enlarged. Right vetricular wall thickness was not well visualized. Right ventricular systolic function was not well  visualized. There is moderately elevated pulmonary artery systolic pressure. The  tricuspid regurgitant velocity is 2.91 m/s, and with an assumed right atrial pressure of 15 mmHg, the estimated right ventricular systolic pressure is 95.1 mmHg. Left Atrium: Left atrial size was normal in size. Right Atrium: Right atrial size was not well visualized. Pericardium: Trivial pericardial effusion is present. Mitral Valve: The mitral valve is abnormal. There is mild thickening of the mitral valve leaflet(s). There is mild calcification of the mitral valve leaflet(s). Moderate mitral annular calcification. Trivial mitral valve regurgitation. No evidence of mitral valve stenosis. Tricuspid Valve: The tricuspid valve is normal in structure. Tricuspid valve regurgitation is mild to moderate. No evidence of tricuspid stenosis. Aortic Valve: The aortic valve is grossly normal. There is mild calcification of the aortic valve. There is mild thickening of the aortic valve. Aortic valve regurgitation is trivial. Aortic valve sclerosis is present, with no evidence of aortic valve stenosis. Pulmonic Valve: The pulmonic valve was not well visualized. Pulmonic valve regurgitation is mild. No  evidence of pulmonic stenosis. Aorta: Aortic dilatation noted. There is borderline dilatation of the ascending aorta, measuring 38 mm. Venous: The inferior vena cava is dilated in size with less than 50% respiratory va

## 2021-10-01 DIAGNOSIS — R6520 Severe sepsis without septic shock: Secondary | ICD-10-CM | POA: Diagnosis not present

## 2021-10-01 DIAGNOSIS — N200 Calculus of kidney: Secondary | ICD-10-CM

## 2021-10-01 DIAGNOSIS — N1330 Unspecified hydronephrosis: Secondary | ICD-10-CM | POA: Diagnosis present

## 2021-10-01 DIAGNOSIS — A419 Sepsis, unspecified organism: Secondary | ICD-10-CM | POA: Diagnosis not present

## 2021-10-01 LAB — CBC WITH DIFFERENTIAL/PLATELET
Abs Immature Granulocytes: 0.2 10*3/uL — ABNORMAL HIGH (ref 0.00–0.07)
Basophils Absolute: 0 10*3/uL (ref 0.0–0.1)
Basophils Relative: 0 %
Eosinophils Absolute: 0 10*3/uL (ref 0.0–0.5)
Eosinophils Relative: 1 %
HCT: 29.4 % — ABNORMAL LOW (ref 39.0–52.0)
Hemoglobin: 10.1 g/dL — ABNORMAL LOW (ref 13.0–17.0)
Immature Granulocytes: 5 %
Lymphocytes Relative: 8 %
Lymphs Abs: 0.3 10*3/uL — ABNORMAL LOW (ref 0.7–4.0)
MCH: 29.4 pg (ref 26.0–34.0)
MCHC: 34.4 g/dL (ref 30.0–36.0)
MCV: 85.5 fL (ref 80.0–100.0)
Monocytes Absolute: 0.4 10*3/uL (ref 0.1–1.0)
Monocytes Relative: 10 %
Neutro Abs: 2.9 10*3/uL (ref 1.7–7.7)
Neutrophils Relative %: 76 %
Platelets: 20 10*3/uL — CL (ref 150–400)
RBC: 3.44 MIL/uL — ABNORMAL LOW (ref 4.22–5.81)
RDW: 14.7 % (ref 11.5–15.5)
Smear Review: DECREASED
WBC: 3.8 10*3/uL — ABNORMAL LOW (ref 4.0–10.5)
nRBC: 1.9 % — ABNORMAL HIGH (ref 0.0–0.2)

## 2021-10-01 LAB — COMPREHENSIVE METABOLIC PANEL
ALT: 35 U/L (ref 0–44)
AST: 59 U/L — ABNORMAL HIGH (ref 15–41)
Albumin: 2.5 g/dL — ABNORMAL LOW (ref 3.5–5.0)
Alkaline Phosphatase: 123 U/L (ref 38–126)
Anion gap: 13 (ref 5–15)
BUN: 105 mg/dL — ABNORMAL HIGH (ref 8–23)
CO2: 19 mmol/L — ABNORMAL LOW (ref 22–32)
Calcium: 8.9 mg/dL (ref 8.9–10.3)
Chloride: 102 mmol/L (ref 98–111)
Creatinine, Ser: 3.41 mg/dL — ABNORMAL HIGH (ref 0.61–1.24)
GFR, Estimated: 18 mL/min — ABNORMAL LOW (ref >60)
Glucose, Bld: 89 mg/dL (ref 70–99)
Potassium: 4.2 mmol/L (ref 3.5–5.1)
Sodium: 134 mmol/L — ABNORMAL LOW (ref 135–145)
Total Bilirubin: 0.8 mg/dL (ref 0.3–1.2)
Total Protein: 5.5 g/dL — ABNORMAL LOW (ref 6.5–8.1)

## 2021-10-01 LAB — MAGNESIUM: Magnesium: 2.3 mg/dL (ref 1.7–2.4)

## 2021-10-01 MED ORDER — CYCLOBENZAPRINE HCL 5 MG PO TABS
5.0000 mg | ORAL_TABLET | Freq: Three times a day (TID) | ORAL | Status: DC
  Administered 2021-10-01 (×2): 5 mg via ORAL
  Filled 2021-10-01 (×2): qty 1

## 2021-10-01 MED ORDER — SODIUM ZIRCONIUM CYCLOSILICATE 10 G PO PACK: 10.0000 g | PACK | Freq: Three times a day (TID) | ORAL | Status: DC

## 2021-10-01 MED ORDER — DICLOFENAC SODIUM 1 % EX GEL
4.0000 g | Freq: Three times a day (TID) | CUTANEOUS | Status: DC
Start: 2021-10-01 — End: 2021-10-03
  Administered 2021-10-01 (×2): 4 g via TOPICAL
  Filled 2021-10-01 (×2): qty 100

## 2021-10-01 MED ORDER — OXYCODONE HCL 5 MG PO TABS: 5.0000 mg | ORAL_TABLET | Freq: Three times a day (TID) | ORAL | Status: DC

## 2021-10-01 MED ORDER — ACETAMINOPHEN 500 MG PO TABS: 1000.0000 mg | ORAL_TABLET | Freq: Three times a day (TID) | ORAL | Status: DC

## 2021-10-01 MED ORDER — SODIUM ZIRCONIUM CYCLOSILICATE 10 G PO PACK
10.0000 g | PACK | Freq: Every day | ORAL | Status: DC
  Filled 2021-10-01: qty 1

## 2021-10-01 MED ORDER — IPRATROPIUM BROMIDE 0.02 % IN SOLN
0.5000 mg | Freq: Three times a day (TID) | RESPIRATORY_TRACT | Status: DC
  Administered 2021-10-02: 0.5 mg via RESPIRATORY_TRACT
  Filled 2021-10-01: qty 2.5

## 2021-10-01 MED ORDER — CYCLOBENZAPRINE HCL 10 MG PO TABS: 5.0000 mg | ORAL_TABLET | Freq: Three times a day (TID) | ORAL | Status: DC

## 2021-10-01 MED ORDER — ACETAMINOPHEN 500 MG PO TABS
1000.0000 mg | ORAL_TABLET | Freq: Three times a day (TID) | ORAL | Status: DC
Start: 2021-10-01 — End: 2021-10-02
  Administered 2021-10-01 (×2): 1000 mg via ORAL
  Filled 2021-10-01 (×2): qty 2

## 2021-10-01 MED ORDER — FENTANYL CITRATE PF 50 MCG/ML IJ SOSY
25.0000 ug | PREFILLED_SYRINGE | INTRAMUSCULAR | Status: DC | PRN
  Filled 2021-10-01: qty 1

## 2021-10-01 MED ORDER — ALBUMIN HUMAN 25 % IV SOLN
25.0000 g | Freq: Once | INTRAVENOUS | Status: AC
  Administered 2021-10-01: 25 g via INTRAVENOUS
  Filled 2021-10-01: qty 100

## 2021-10-01 NOTE — Progress Notes (Addendum)
? ?Palliative Medicine Inpatient Follow Up Note ? ? ?HPI: ?Aaron Klein is a 76 y.o. 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 Found to have gram-negative sepsis with Klebsiella bacteremia most likely secondary to UTI as well as acute kidney injury and bicytopenia as well as acute on chronic hypoxic respiratory failure in the setting of aspiration pneumonia.EKG also shows new onset atrial flutter likely in response to respiratory distress and elevated troponin. Nephrology, urology, hematology and palliative care consulted. ?  ?Palliative care asked to get involved to further address goals of care in the setting of acute on chronic illness.  ?  ?Today's Discussion (10/01/2021): ? ?*Please note that this is a verbal dictation therefore any spelling or grammatical errors are due to the "[place] [employer]" system interpretation. ? ?Chart reviewed inclusive of vital signs, progress notes, laboratory results, and diagnostic images.  ? ?I met with Aaron Klein at bedside this morning. He was a great deal more comfortable and shares is back pain has eased up quite a bit. He shares his breathing, "feels better". Emmons is looking forward to going home. I shared there remain to be many active health ailments that will need further treatment prior to his going home. Reviewed the importance of mobility.  ? ?A MOST form was completed indicating his wishes as per our conversation yesterday. We reconfirmed this this morning.  ? ?Questions and concerns addressed  ? ?Palliative Support Provided ?____________________________ ?Addendum: ? ?On evening check patient noted to have severe lower back pain. Reviewed limitations in the setting of patients blood pressures and tenuous respiratory state. Discussed a plan for better management inclusive of flexeril.  ? ?Objective Assessment: ?Vital  Signs ?Vitals:  ? 10/01/21 0414 10/01/21 0915  ?BP: 107/66   ?Pulse: 97   ?Resp:    ?Temp: 98.5 ?F (36.9 ?C)   ?SpO2: 94% 93%  ? ? ?Intake/Output Summary (Last 24 hours) at 10/01/2021 1036 ?Last data filed at 10/01/2021 0300 ?Gross per 24 hour  ?Intake 450 ml  ?Output 1125 ml  ?Net -675 ml  ? ?Last Weight  Most recent update: 09/29/2021  4:01 AM  ? ? Weight  ?78.6 kg (173 lb 3.2 oz)  ?      ? ?  ? ?Gen:  Elderly caucasian M in NAD ?HEENT: moist mucous membranes ?CV: Regular rate and irregular rhythm ?PULM: 7LPM HFNC, breathing non-labored ?ABD: soft/nontender ?EXT: No edema ?Neuro: Alert and oriented x3 ? ?SUMMARY OF RECOMMENDATIONS   ?Partial Code - No Chest Compressions or intubation would want shocks, ACLS medications and Bipap ?  ?MOST for completed and placed in Chart ?  ?Watchful waiting for improvements ?  ?Ongoing support ? ?Symptoms: ?Chronic Lower Back Pain: ?- Flexeril $RemoveBef'5mg'FnnbDFQjLn$  PO TID ?- Tylenol 1g TID ?- Oxycodone $RemoveBefo'5mg'ySLLNKWUYYa$  PO Q4H PRN moderate-severe ?- Fentanyl 85mcg Q3H for breakthrough pain ?- Lidocaine Patch On 12 hours/ Off 12 hours ?--> Additional considerations are low dose steroids and/or neuropathic agents though presently this pain does not radiate to distal digits or cause tingling/numbness. ?--> Limited by patients tenuous respiratory state and pressures which are on the soft side.  ? ?MDM - High ?______________________________________________________________________________________ ?Tacey Ruiz ?[facility] Team ?Team Cell Phone: [phone] ?Please utilize secure chat with additional questions, if there is no response within 30 minutes please call the above phone number ? ?Palliative Medicine Team providers  are available by phone from 7am to 7pm daily and can be reached through the team cell phone.  ?Should this patient require assistance outside of these hours, please call the patient's attending physician. ? ? ? ? ?

## 2021-10-01 NOTE — Evaluation (Signed)
Occupational Therapy Evaluation ?Patient Details ?Name: Aaron Klein ?MRN: [id number] ?DOB: 1946-03-18 ?Today's Date: 10/01/2021 ? ? ?History of Present Illness Pt adm 4/5 with severe sepsis, acute hypoxic respiratory failure and AKI due to aspiration PNA and UTI. Pt found to have uretal stone and stent placed. PMH - chronic back pain, glottic CA, a flutter  ? ?Clinical Impression ?  ?Pt reports independence at baseline with ADLs and functional mobility, lives alone but has family within 5 min drive that can assist at d/c. Pt min-max A for ADLs this session, supervision for bed mobility, and min guard for stand-pivot transfer back to bed. VSS on 7L O2 during session, pt fatigued from prior PT session and wanting to rest, further mobility/ADLs deferred. Pt presenting with impairments listed below, will follow acutely. Recommend HHOT at d/c. ?   ? ?Recommendations for follow up therapy are one component of a multi-disciplinary discharge planning process, led by the attending physician.  Recommendations may be updated based on patient status, additional functional criteria and insurance authorization.  ? ?Follow Up Recommendations ? Home health OT  ?  ?Assistance Recommended at Discharge Intermittent Supervision/Assistance  ?Patient can return home with the following A little help with walking and/or transfers;A lot of help with bathing/dressing/bathroom;Assistance with cooking/housework;Assist for transportation;Help with stairs or ramp for entrance ? ?  ?Functional Status Assessment ? Patient has had a recent decline in their functional status and demonstrates the ability to make significant improvements in function in a reasonable and predictable amount of time.  ?Equipment Recommendations ? BSC/3in1  ?  ?Recommendations for Other Services   ? ? ?  ?Precautions / Restrictions Precautions ?Precautions: Fall ?Restrictions ?Weight Bearing Restrictions: No  ? ?  ? ?Mobility Bed Mobility ?Overal bed mobility: Needs  Assistance ?Bed Mobility: Sit to Supine ?  ?  ?  ?Sit to supine: Supervision ?  ?  ?  ? ?Transfers ?Overall transfer level: Needs assistance ?Equipment used: Rolling walker (2 wheels) ?Transfers: Sit to/from Stand ?Sit to Stand: Min guard ?  ?  ?  ?  ?  ?  ?  ? ?  ?Balance Overall balance assessment: Needs assistance ?Sitting-balance support: No upper extremity supported, Feet supported ?Sitting balance-Leahy Scale: Good ?  ?  ?Standing balance support: Bilateral upper extremity supported, During functional activity, Reliant on assistive device for balance ?Standing balance-Leahy Scale: Poor ?  ?  ?  ?  ?  ?  ?  ?  ?  ?  ?  ?  ?   ? ?ADL either performed or assessed with clinical judgement  ? ?ADL Overall ADL's : Needs assistance/impaired ?Eating/Feeding: Set up;Sitting ?  ?Grooming: Minimal assistance;Sitting;Standing ?  ?Upper Body Bathing: Sitting;Minimal assistance ?  ?Lower Body Bathing: Maximal assistance;Moderate assistance;Sitting/lateral leans;Sit to/from stand ?  ?Upper Body Dressing : Minimal assistance;Sitting ?  ?Lower Body Dressing: Maximal assistance;Moderate assistance;Sitting/lateral leans;Sit to/from stand ?  ?Toilet Transfer: Min guard;Stand-pivot;BSC/3in1 ?Toilet Transfer Details (indicate cue type and reason): simulated to bed ?Toileting- Clothing Manipulation and Hygiene: Minimal assistance ?  ?  ?  ?Functional mobility during ADLs: Rolling walker (2 wheels);Min guard ?   ? ? ? ?Vision   ?Vision Assessment?: No apparent visual deficits  ?   ?Perception   ?  ?Praxis   ?  ? ?Pertinent Vitals/Pain Pain Assessment ?Pain Assessment: Faces ?Pain Score: 6  ?Pain Location: generalized ?Pain Descriptors / Indicators: Grimacing, Guarding, Moaning ?Pain Intervention(s): Limited activity within patient's tolerance, Monitored during session, Repositioned  ? ? ? ?  Hand Dominance Right ?  ?Extremity/Trunk Assessment Upper Extremity Assessment ?Upper Extremity Assessment: Generalized weakness ?  ?Lower  Extremity Assessment ?Lower Extremity Assessment: Defer to PT evaluation ?  ?Cervical / Trunk Assessment ?Cervical / Trunk Assessment: Other exceptions (scoliosis) ?Cervical / Trunk Exceptions: severe scoliosis ?  ?Communication Communication ?Communication: No difficulties ?  ?Cognition Arousal/Alertness: Awake/alert ?Behavior During Therapy: Impulsive, Anxious ?Overall Cognitive Status: Impaired/Different from baseline ?Area of Impairment: Attention, Safety/judgement, Problem solving ?  ?  ?  ?  ?  ?  ?  ?  ?  ?Current Attention Level: Sustained ?  ?  ?Safety/Judgement: Decreased awareness of safety, Decreased awareness of deficits ?  ?Problem Solving: Requires verbal cues ?  ?  ?  ?General Comments  VSS on 7L O2 ? ?  ?Exercises   ?  ?Shoulder Instructions    ? ? ?Home Living Family/patient expects to be discharged to:: Private residence ?Living Arrangements: Alone ?Available Help at Discharge: Family;Friend(s);Available 24 hours/day ?Type of Home: House ?Home Access: Stairs to enter (Simultaneous filing. User may not have seen previous data.) ?Entrance Stairs-Number of Steps: 2 ?Entrance Stairs-Rails: None ?Home Layout: One level ?  ?  ?Bathroom Shower/Tub: Walk-in shower ?  ?  ?  ?  ?Home Equipment: Conservation officer, nature (2 wheels);Shower seat ?  ?Additional Comments: does not wear O2 at baseline ?  ? ?  ?Prior Functioning/Environment Prior Level of Function : Independent/Modified Independent ?  ?  ?  ?  ?  ?  ?Mobility Comments: no AD use ?ADLs Comments: does IADLs ?  ? ?  ?  ?OT Problem List: Decreased strength;Decreased range of motion;Decreased activity tolerance;Impaired balance (sitting and/or standing);Decreased knowledge of use of DME or AE;Pain;Cardiopulmonary status limiting activity ?  ?   ?OT Treatment/Interventions: Self-care/ADL training;Therapeutic exercise;DME and/or AE instruction;Energy conservation;Patient/family education;Balance training;Therapeutic activities  ?  ?OT Goals(Current goals can be  found in the care plan section) Acute Rehab OT Goals ?Patient Stated Goal: to go home ?OT Goal Formulation: With patient ?Time For Goal Achievement: 10/15/21 ?Potential to Achieve Goals: Fair ?ADL Goals ?Pt Will Perform Upper Body Dressing: with min guard assist;sitting;standing ?Pt Will Perform Lower Body Dressing: with min assist;sit to/from stand;sitting/lateral leans ?Pt Will Transfer to Toilet: with supervision;ambulating;regular height toilet ?Pt Will Perform Tub/Shower Transfer: Shower transfer;Tub transfer;3 in 1;rolling walker;with min assist  ?OT Frequency: Min 3X/week ?  ? ?Co-evaluation   ?  ?  ?  ?  ? ?  ?AM-PAC OT "6 Clicks" Daily Activity     ?Outcome Measure Help from another person eating meals?: None ?Help from another person taking care of personal grooming?: A Little ?Help from another person toileting, which includes using toliet, bedpan, or urinal?: A Little ?Help from another person bathing (including washing, rinsing, drying)?: A Lot ?Help from another person to put on and taking off regular upper body clothing?: A Little ?Help from another person to put on and taking off regular lower body clothing?: A Lot ?6 Click Score: 17 ?  ?End of Session Equipment Utilized During Treatment: Oxygen ?Nurse Communication: Mobility status ? ?Activity Tolerance: Patient limited by pain;Patient limited by fatigue ?Patient left: in bed;with call bell/phone within reach;with bed alarm set;with family/visitor present ? ?OT Visit Diagnosis: Unsteadiness on feet (R26.81);Muscle weakness (generalized) (M62.81);Other abnormalities of gait and mobility (R26.89);Pain ?Pain - part of body:  (generalized)  ?              ?Time: 2423-5361 ?OT Time Calculation (min): 17 min ?Charges:  OT General  Charges ?$OT Visit: 1 Visit ?OT Evaluation ?$OT Eval Low Complexity: 1 Low ? ?Lynnda Child, OTD, OTR/L ?Acute Rehab ?[phone] ? ?Kaylyn Lim ?10/01/2021, 2:30 PM ?

## 2021-10-01 NOTE — Progress Notes (Signed)
BIPAP not indicated at this time. No respiratory distress noted. RT will monitor as needed. ?

## 2021-10-01 NOTE — Evaluation (Signed)
Physical Therapy Evaluation ?Patient Details ?Name: Aaron Klein ?MRN: [id number] ?DOB: Apr 19, 1946 ?Today's Date: 10/01/2021 ? ?History of Present Illness ? Pt adm 4/5 with severe sepsis, acute hypoxic respiratory failure and AKI due to aspiration PNA and UTI. Pt found to have uretal stone and stent placed. PMH - chronic back pain, glottic CA, a flutter  ?Clinical Impression ? Pt presents to PT with significant decline in mobility due to medical issues which have resulted in very poor activity tolerance, pain, weakness, and poor balance. Significant other states she plans to stay with the pt at dc and that brothers can help as well. I think his ability to return home will be dependent on his medical status. Generally pt appears very uncomfortable and hospice should be considered.    ?   ? ?Recommendations for follow up therapy are one component of a multi-disciplinary discharge planning process, led by the attending physician.  Recommendations may be updated based on patient status, additional functional criteria and insurance authorization. ? ?Follow Up Recommendations Home health PT ? ?  ?Assistance Recommended at Discharge Frequent or constant Supervision/Assistance  ?Patient can return home with the following ? A little help with walking and/or transfers;Help with stairs or ramp for entrance;Assist for transportation;Direct supervision/assist for medications management ? ?  ?Equipment Recommendations Wheelchair (measurements PT);Wheelchair cushion (measurements PT) (Pt's brother says pt can borrow his walke)  ?Recommendations for Other Services ?    ?  ?Functional Status Assessment Patient has had a recent decline in their functional status and/or demonstrates limited ability to make significant improvements in function in a reasonable and predictable amount of time  ? ?  ?Precautions / Restrictions    ? ?  ? ?Mobility ? Bed Mobility ?Overal bed mobility: Needs Assistance ?Bed Mobility: Supine to Sit ?  ?   ?Supine to sit: Min assist ?  ?  ?General bed mobility comments: Assist to elevate trunk into sitting ?  ? ?Transfers ?Overall transfer level: Needs assistance ?Equipment used: Rolling walker (2 wheels) ?Transfers: Sit to/from Stand ?Sit to Stand: Min assist ?  ?  ?  ?  ?  ?General transfer comment: Assist to bring hips up and for balance ?  ? ?Ambulation/Gait ?Ambulation/Gait assistance: Min assist ?Gait Distance (Feet): 100 Feet ?Assistive device: Rolling walker (2 wheels) ?Gait Pattern/deviations: Step-through pattern, Decreased stride length, Trunk flexed ?Gait velocity: decr but too fast for abilities at times ?Gait velocity interpretation: 1.31 - 2.62 ft/sec, indicative of limited community ambulator ?  ?General Gait Details: Assist for balance and frequent verbal cues to step closer to walker. Pt rushing at times and needed cues to slow down and use walker correctly and allow me time to manage his other equipment (IV, heart monitor etc). Pt with significant forward flexion due to kyphosis and scoliosis ? ?Stairs ?  ?  ?  ?  ?  ? ?Wheelchair Mobility ?  ? ?Modified Rankin (Stroke Patients Only) ?  ? ?  ? ?Balance Overall balance assessment: Needs assistance ?Sitting-balance support: No upper extremity supported, Feet supported ?Sitting balance-Leahy Scale: Good ?  ?  ?Standing balance support: Bilateral upper extremity supported, During functional activity, Reliant on assistive device for balance ?Standing balance-Leahy Scale: Poor ?Standing balance comment: walker and min guard for static standing ?  ?  ?  ?  ?  ?  ?  ?  ?  ?  ?  ?   ? ? ? ?Pertinent Vitals/Pain Pain Assessment ?Pain Assessment: Faces ?Faces Pain Scale: Hurts  even more ?Pain Location: all over ?Pain Descriptors / Indicators: Grimacing, Guarding, Moaning ?Pain Intervention(s): Limited activity within patient's tolerance, Repositioned  ? ? ?Home Living Family/patient expects to be discharged to:: Private residence (Simultaneous filing. User  may not have seen previous data.) ?Living Arrangements: Alone (Girlfriend plans to stay with him  Simultaneous filing. User may not have seen previous data.) ?Available Help at Discharge: Family;Friend(s);Available 24 hours/day (Simultaneous filing. User may not have seen previous data.) ?Type of Home: House (Simultaneous filing. User may not have seen previous data.) ?Home Access: Stairs to enter (Simultaneous filing. User may not have seen previous data.) ?Entrance Stairs-Rails: None ?Entrance Stairs-Number of Steps: 2 (Simultaneous filing. User may not have seen previous data.) ?  ?Home Layout: One level (Simultaneous filing. User may not have seen previous data.) ?Home Equipment: Conservation officer, nature (2 wheels);Shower seat ?   ?  ?Prior Function Prior Level of Function : Independent/Modified Independent (Simultaneous filing. User may not have seen previous data.) ?  ?  ?  ?  ?  ?  ?Mobility Comments: No assistive device (Simultaneous filing. User may not have seen previous data.) ?ADLs Comments: does IADLs ?  ? ? ?Hand Dominance  ? Dominant Hand: Right (Simultaneous filing. User may not have seen previous data.) ? ?  ?Extremity/Trunk Assessment  ? Upper Extremity Assessment ?Upper Extremity Assessment: Defer to OT evaluation ?  ? ?Lower Extremity Assessment ?Lower Extremity Assessment: Generalized weakness ?  ? ?Cervical / Trunk Assessment ?Cervical / Trunk Assessment: Kyphotic;Other exceptions ?Cervical / Trunk Exceptions: severe scoliosis  ?Communication  ? Communication: No difficulties (Simultaneous filing. User may not have seen previous data.)  ?Cognition Arousal/Alertness: Awake/alert ?Behavior During Therapy: Impulsive, Anxious ?Overall Cognitive Status: Impaired/Different from baseline ?Area of Impairment: Attention, Safety/judgement, Problem solving ?  ?  ?  ?  ?  ?  ?  ?  ?  ?Current Attention Level: Sustained ?  ?  ?Safety/Judgement: Decreased awareness of safety, Decreased awareness of deficits ?   ?Problem Solving: Requires verbal cues ?  ?  ?  ? ?  ?General Comments General comments (skin integrity, edema, etc.): Pt on 7L O2 at rest with SpO2 >92%. Amb on 8L with SpO2 >88% ? ?  ?Exercises    ? ?Assessment/Plan  ?  ?PT Assessment Patient needs continued PT services  ?PT Problem List Decreased strength;Decreased activity tolerance;Decreased balance;Decreased mobility;Decreased cognition;Decreased knowledge of use of DME;Decreased safety awareness;Pain ? ?   ?  ?PT Treatment Interventions DME instruction;Gait training;Functional mobility training;Therapeutic activities;Therapeutic exercise;Balance training;Patient/family education   ? ?PT Goals (Current goals can be found in the Care Plan section)  ?Acute Rehab PT Goals ?Patient Stated Goal: go home ?PT Goal Formulation: With patient/family ?Time For Goal Achievement: 10/15/21 ?Potential to Achieve Goals: Fair ? ?  ?Frequency Min 3X/week ?  ? ? ?Co-evaluation   ?  ?  ?  ?  ? ? ?  ?AM-PAC PT "6 Clicks" Mobility  ?Outcome Measure Help needed turning from your back to your side while in a flat bed without using bedrails?: A Little ?Help needed moving from lying on your back to sitting on the side of a flat bed without using bedrails?: A Little ?Help needed moving to and from a bed to a chair (including a wheelchair)?: A Little ?Help needed standing up from a chair using your arms (e.g., wheelchair or bedside chair)?: A Little ?Help needed to walk in hospital room?: A Little ?Help needed climbing 3-5 steps with a railing? : A Lot ?  6 Click Score: 17 ? ?  ?End of Session Equipment Utilized During Treatment: Oxygen ?Activity Tolerance: Patient limited by fatigue ?Patient left: in chair;with call bell/phone within reach;with chair alarm set;with family/visitor present ?Nurse Communication: Mobility status;Other (comment) (Redness over thoracic spine) ?PT Visit Diagnosis: Other abnormalities of gait and mobility (R26.89);Unsteadiness on feet (R26.81);Muscle weakness  (generalized) (M62.81);Pain ?Pain - part of body:  (Generalized) ?  ? ?Time: 7017-7939 ?PT Time Calculation (min) (ACUTE ONLY): 34 min ? ? ?Charges:   PT Evaluation ?$PT Eval Moderate Complexity: 1 Mod ?PT Treatments ?$

## 2021-10-01 NOTE — Assessment & Plan Note (Signed)
Right kidney hydronephrosis. ?Urology was consulted.  Patient underwent cystoscopy with right ureteral stent placement on 4/6. ?Foley catheter was inserted. ?Per urology Foley catheter removed on 4/9. ?

## 2021-10-01 NOTE — Progress Notes (Signed)
?Progress Note ?Patient: Aaron Klein ZGY:[id number] DOB: Apr 08, 1946 DOA: 10/22/2021  ?DOS: the patient was seen and examined on 10/01/2021 ? ?Brief hospital course: ?Aaron Klein is a 76 y.o. male with history of glotticcancerinremission GERD, low back pain, hyperlipidemia presents to the ER at med center with complaints of having feeling weakness for the last few days.  ?Found to have gram-negative sepsis with Klebsiella bacteremia most likely secondary to UTI as well as acute kidney injury and bicytopenia as well as acute on chronic hypoxic respiratory failure in the setting of aspiration pneumonia. ?EKG also shows new onset atrial flutter likely in response to respiratory distress and elevated troponin. ?Nephrology, urology, hematology and palliative care consulted. ? ?Assessment and Plan: ?* Severe sepsis ([place]) ?secondary to aspiration pneumonia and UTI, present on admission ?Klebsiella bacteremia ?Acute hypoxic respiratory failure, present on admission ?Presents with complaints of fatigue. ?Met SIRS criteria on admission with fever, leukocytosis, hypotension and tachycardia. ?Started on IV antibiotics. ?Blood cultures came back positive for Klebsiella. ?CT scan shows evidence of hydronephrosis. ?Urology was consulted underwent stent placement. ?Urine culture also growing gram-negative rods. ?Chest x-ray shows multifocal pneumonia. ?Requiring 2 L of oxygen on admission currently on 9 L of oxygen. ?ABG shows no evidence of hypercarbia but shows severe hypoxia. ?We will use BiPAP as needed. ? ?AKI (acute kidney injury) ([place]) ?with hyperkalemia, hyponatremia and hypocalcemia. ?Mostly combination of ATN with obstructive uropathy. ?Metabolic acidosis ?Patient has a Foley catheter as well as stent placement which should treat acute kidney will continue. ?But suspect that the patient is suffering from dense ATN in the setting of sepsis. ?BUN significantly worsening despite improvement in serum creatinine. ?Urine  output more than a liter in last 24 hours. ?Renal dosing. ?Hyperkalemia improving.   ?On bicarb therapy ? ?Typical atrial flutter ([place]) ?Currently rate controlled although intermittently goes with RVR. ?At present limited room for further rate control medication. ?Definitely not a candidate for anticoagulation given his severe thrombocytopenia. ?Currently receiving midodrine.  Will monitor. ? ?Thrombocytopenia ([place]) ?Likely in the setting of gram-negative sepsis. ?Work-up so far negative for any DIC or TTP or HUS like panel. ?Monitor for now. ?Appreciate hematology consultation. ? ?Right kidney stone ?Right kidney hydronephrosis. ?Urology was consulted.  Patient underwent cystoscopy with right ureteral stent placement on 4/6. ?Foley catheter was inserted. ?Per urology Foley catheter removed on 4/9. ? ?Goals of care, counseling/discussion ?Appreciate palliative care assistance. ?Multiple discussion with the family with regards to patient's current critical condition in the setting of multiorgan failure and patient's only primary complaint of severe low back pain.  Patient has been back-and-forth with limited code and full code. ?Now after conversation with palliative care team patient is back to partial code. ?Pain control is attempted with the use of oral oxycodone and lidocaine patch.  Limitation will be encephalopathy as well as hypotension. ? 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 ?RV also moderately enlarged. ?Monitor closely.  Renders poor prognosis. ? ?Elevated LFTs ?Likely in the setting of sepsis and congestion. ?Monitor for now. ? ?Subjective: No nausea no vomiting no fever no chills. ? ?Physical Exam: ?Vitals:  ? 10/01/21 0150 10/01/21 0414 10/01/21 0915 10/01/21 1448  ?BP:  107/66    ?Pulse:  97    ?Resp:      ?Temp:  98.5 ?F (36.9 ?C)    ?TempSrc:  Oral    ?SpO2: 92% 94%  93% 95%  ?Weight:      ?Height:      ? ?General: Appear in mild distress; no visible Abnormal Neck Mass Or lumps, Conjunctiva normal ?Cardiovascular: S1 and S2 Present, no Murmur, ?Respiratory: increased respiratory effort, Bilateral Air entry present and bilateral  Crackles, no wheezes ?Abdomen: Bowel Sound present, Non tender  ?Extremities: no Pedal edema ?Neurology: alert and oriented to time, place, and person ?Gait not checked due to patient safety concerns  ? ?Data Reviewed: ?I have Reviewed nursing notes, Vitals, and Lab results since pt's last encounter. Pertinent lab results CBC and CMP ?I have ordered test including CBC and BMP ?I have reviewed the last note from nephrology, palliative care,   ?Discussed the case with urology ? ?Family Communication: Family at bedside ? ?Disposition: ?Status is: Inpatient ?Remains inpatient appropriate because: Ongoing requirement of IV antibiotics as well as close monitoring of renal function ? ?Author: ?Berle Mull, MD ?10/01/2021 6:56 PM ? ?For on call review [email or web address]. ?

## 2021-10-01 NOTE — Progress Notes (Signed)
?[facility] ?Progress Note  ? ? ?Assessment/ Plan:   ? AKI: last known Cr 1.01 2021 in [place].  Presenting with AKI in the setting of obstructing stone and sepsis.  Making some urine but not a whole lot ?- keep Foley ?- urology following s/p stone removal ?- in setting of increasing O2 requirements- stop continuous IVFs, one 25 g dose of IV albumin- one more today  ?- added midodrine 10 TID to help augment BP and improve renal perfusion ?- treat K with lokelma BID, increase to TID--> improved so will back off  ?- avoid nephrotoxins, IV contrast as able, fleets enemas, mag citrate ?- no indications for HD now since improving and making some urine but need to watch closely--> increasing UOP ?  ?2.  Severe sepsis 2/2 Klebsiella bacteremia ?- getting ceftriaxone per primary ?  ?3.  Hyperkalemia: ?- lokelma BID--> TID--> decrease to daily 10/01/21 ?  ?4.  Hyponatremia: ?- likely d/t impaired free water excretion with AKI ?- adding on AM cortisol--> appropriately elevated ?  ?5.  Thrombocytopenia: ?- likely d/t sepsis, no hemolytic process identified, heme consulted ?  ?6.  Acute hypoxic RF: ?- aspiration component, O2 requirement increasing after IVFs ?- stop continuous IVFs, albumin 25 g dose x 1- will do again today ?- ceftriaxone and Flagyl per primary ?  ?7.  Dispo: inpt.  He has MSOF and I agree with palliative care c/s.  Greatly appreciate their assistance ? ?Subjective:   ? ?Much more comfortable than yesterday.    ? ?Objective:   ?BP 107/66 (BP Location: Left Arm)   Pulse 97   Temp 98.5 ?F (36.9 ?C) (Oral)   Resp 18   Ht '5\' 9"'$  (1.753 m)   Wt 78.6 kg   SpO2 93%   BMI 25.58 kg/m?  ? ?Intake/Output Summary (Last 24 hours) at 10/01/2021 1138 ?Last data filed at 10/01/2021 0300 ?Gross per 24 hour  ?Intake 450 ml  ?Output 1125 ml  ?Net -675 ml  ? ?Weight change:  ? ?Physical Exam: ?GLO:VFIEP on back, sleeping and easily aroudable ?CVS: tachycardic ?Resp: some crackles ?Abd: soft ?GU: Foley in  place ?Ext: no LE edema ? ?Imaging: ?DG Pelvis Portable ? ?Result Date: 09/30/2021 ?CLINICAL DATA:  Low back pain EXAM: PORTABLE PELVIS 1-2 VIEWS COMPARISON:  Abdominal CT from 2 years ago FINDINGS: There is no evidence of pelvic fracture or diastasis. No pelvic bone lesions are seen. Both hips are located and show no significant degenerative change. Partially covered lumbar scoliosis and degeneration. Enteric contrast and right ureteral stone. IMPRESSION: Negative pelvis and hips. Electronically Signed   By: Jorje Guild M.D.   On: 09/30/2021 09:32  ? ?DG CHEST PORT 1 VIEW ? ?Result Date: 09/29/2021 ?CLINICAL DATA:  76 year old male with a history of shortness of breath EXAM: PORTABLE CHEST 1 VIEW COMPARISON:  10/01/2021 FINDINGS: Cardiomediastinal silhouette unchanged. Double density projecting over the lower mediastinum is unchanged. Low lung volumes persist. Increased patchy opacities in the right upper lung right lower lung, left mid lung overlying the left heart border. No pneumothorax. No large pleural effusion. IMPRESSION: Increasing patchy airspace opacities, concerning for multifocal pneumonia. Followup PA and lateral chest X-ray is recommended in 3-4 weeks following trial of therapy to assure resolution. Hiatal hernia Electronically Signed   By: Corrie Mckusick D.O.   On: 09/29/2021 12:18  ? ?ECHOCARDIOGRAM COMPLETE ? ?Result Date: 09/29/2021 ?   ECHOCARDIOGRAM REPORT   Patient Name:   Aaron Klein Date of Exam: 09/29/2021  Medical Rec #:  [id number]      Height:       69.0 in Accession #:    [id number]     Weight:       173.2 lb Date of Birth:  May 08, 1946     BSA:          1.943 m? Patient Age:    1 years       BP:           104/84 mmHg Patient Gender: M              HR:           102 bpm. Exam Location:  Inpatient Procedure: 2D Echo, Cardiac Doppler, Color Doppler and Intracardiac            Opacification Agent Indications:    R06.02 SOB  History:        Patient has no prior history of Echocardiogram  examinations.                 Abnormal ECG, Arrythmias:Atrial Fibrillation,                 Signs/Symptoms:Bacteremia; Risk Factors:Hypertension and Sleep                 Apnea. Cancer.  Sonographer:    Roseanna Rainbow RDCS Referring Phys: [id number] [facility] M PATEL  Sonographer Comments: Technically difficult study due to poor echo windows and no apical window. No true apical, images medial or from subcostal window. Parasternal images very low IMPRESSIONS  1. Left ventricular ejection fraction, by estimation, is 40 to 45%. The left ventricle has mildly decreased function. The left ventricle demonstrates global hypokinesis. There is mild concentric left ventricular hypertrophy. Left ventricular diastolic function could not be evaluated.  2. Right ventricular systolic function was not well visualized. The right ventricular size is moderately enlarged. There is moderately elevated pulmonary artery systolic pressure.  3. The mitral valve is abnormal. Trivial mitral valve regurgitation. No evidence of mitral stenosis. Moderate mitral annular calcification.  4. Tricuspid valve regurgitation is mild to moderate.  5. The aortic valve is grossly normal. There is mild calcification of the aortic valve. There is mild thickening of the aortic valve. Aortic valve regurgitation is trivial. Aortic valve sclerosis is present, with no evidence of aortic valve stenosis.  6. Aortic dilatation noted. There is borderline dilatation of the ascending aorta, measuring 38 mm.  7. The inferior vena cava is dilated in size with <50% respiratory variability, suggesting right atrial pressure of 15 mmHg. Comparison(s): No prior Echocardiogram. Conclusion(s)/Recommendation(s): Limited windows, even with use of echo contrast. LVEF mildly reduced with global hypokinesis (perhaps slightly more prominent in the septum). RV moderately enlarged with elevated RVSP of 49 mmHg. FINDINGS  Left Ventricle: Left ventricular ejection fraction, by estimation, is 40 to  45%. The left ventricle has mildly decreased function. The left ventricle demonstrates global hypokinesis. Definity contrast agent was given IV to delineate the left ventricular  endocardial borders. The left ventricular internal cavity size was normal in size. There is mild concentric left ventricular hypertrophy. Left ventricular diastolic function could not be evaluated due to atrial fibrillation. Left ventricular diastolic function could not be evaluated. Right Ventricle: The right ventricular size is moderately enlarged. Right vetricular wall thickness was not well visualized. Right ventricular systolic function was not well visualized. There is moderately elevated pulmonary artery systolic pressure. The  tricuspid regurgitant velocity is 2.91 m/s, and with an assumed right atrial pressure  of 15 mmHg, the estimated right ventricular systolic pressure is 65.0 mmHg. Left Atrium: Left atrial size was normal in size. Right Atrium: Right atrial size was not well visualized. Pericardium: Trivial pericardial effusion is present. Mitral Valve: The mitral valve is abnormal. There is mild thickening of the mitral valve leaflet(s). There is mild calcification of the mitral valve leaflet(s). Moderate mitral annular calcification. Trivial mitral valve regurgitation. No evidence of mitral valve stenosis. Tricuspid Valve: The tricuspid valve is normal in structure. Tricuspid valve regurgitation is mild to moderate. No evidence of tricuspid stenosis. Aortic Valve: The aortic valve is grossly normal. There is mild calcification of the aortic valve. There is mild thickening of the aortic valve. Aortic valve regurgitation is trivial. Aortic valve sclerosis is present, with no evidence of aortic valve stenosis. Pulmonic Valve: The pulmonic valve was not well visualized. Pulmonic valve regurgitation is mild. No evidence of pulmonic stenosis. Aorta: Aortic dilatation noted. There is borderline dilatation of the ascending aorta,  measuring 38 mm. Venous: The inferior vena cava is dilated in size with less than 50% respiratory variability, suggesting right atrial pressure of 15 mmHg. IAS/Shunts: The interatrial septum was not well visualized.  LEFT VENTRICL

## 2021-10-02 ENCOUNTER — Inpatient Hospital Stay (HOSPITAL_COMMUNITY): Payer: Medicare HMO

## 2021-10-02 DIAGNOSIS — Z515 Encounter for palliative care: Secondary | ICD-10-CM

## 2021-10-02 DIAGNOSIS — R6520 Severe sepsis without septic shock: Secondary | ICD-10-CM | POA: Diagnosis not present

## 2021-10-02 DIAGNOSIS — C320 Malignant neoplasm of glottis: Secondary | ICD-10-CM

## 2021-10-02 DIAGNOSIS — A419 Sepsis, unspecified organism: Secondary | ICD-10-CM | POA: Diagnosis not present

## 2021-10-02 DIAGNOSIS — Z7189 Other specified counseling: Secondary | ICD-10-CM | POA: Diagnosis not present

## 2021-10-02 DIAGNOSIS — J690 Pneumonitis due to inhalation of food and vomit: Secondary | ICD-10-CM

## 2021-10-02 DIAGNOSIS — J9601 Acute respiratory failure with hypoxia: Secondary | ICD-10-CM

## 2021-10-02 DIAGNOSIS — I469 Cardiac arrest, cause unspecified: Secondary | ICD-10-CM

## 2021-10-02 LAB — CBC WITH DIFFERENTIAL/PLATELET
Abs Immature Granulocytes: 0.79 10*3/uL — ABNORMAL HIGH (ref 0.00–0.07)
Basophils Absolute: 0 10*3/uL (ref 0.0–0.1)
Basophils Relative: 1 %
Eosinophils Absolute: 0 10*3/uL (ref 0.0–0.5)
Eosinophils Relative: 0 %
HCT: 30.6 % — ABNORMAL LOW (ref 39.0–52.0)
Hemoglobin: 10.4 g/dL — ABNORMAL LOW (ref 13.0–17.0)
Immature Granulocytes: 11 %
Lymphocytes Relative: 28 %
Lymphs Abs: 2 10*3/uL (ref 0.7–4.0)
MCH: 29.5 pg (ref 26.0–34.0)
MCHC: 34 g/dL (ref 30.0–36.0)
MCV: 86.9 fL (ref 80.0–100.0)
Monocytes Absolute: 0.4 10*3/uL (ref 0.1–1.0)
Monocytes Relative: 5 %
Neutro Abs: 4.1 10*3/uL (ref 1.7–7.7)
Neutrophils Relative %: 55 %
Platelets: 25 10*3/uL — CL (ref 150–400)
RBC: 3.52 MIL/uL — ABNORMAL LOW (ref 4.22–5.81)
RDW: 15 % (ref 11.5–15.5)
Smear Review: DECREASED
WBC: 7.4 10*3/uL (ref 4.0–10.5)
nRBC: 3.4 % — ABNORMAL HIGH (ref 0.0–0.2)

## 2021-10-02 LAB — CBC
HCT: 25.8 % — ABNORMAL LOW (ref 39.0–52.0)
Hemoglobin: 8.7 g/dL — ABNORMAL LOW (ref 13.0–17.0)
MCH: 28.9 pg (ref 26.0–34.0)
MCHC: 33.7 g/dL (ref 30.0–36.0)
MCV: 85.7 fL (ref 80.0–100.0)
Platelets: 20 10*3/uL — CL (ref 150–400)
RBC: 3.01 MIL/uL — ABNORMAL LOW (ref 4.22–5.81)
RDW: 15 % (ref 11.5–15.5)
WBC: 5 10*3/uL (ref 4.0–10.5)
nRBC: 2.8 % — ABNORMAL HIGH (ref 0.0–0.2)

## 2021-10-02 LAB — COMPREHENSIVE METABOLIC PANEL
ALT: 34 U/L (ref 0–44)
ALT: 33 U/L (ref 0–44)
AST: 69 U/L — ABNORMAL HIGH (ref 15–41)
AST: 69 U/L — ABNORMAL HIGH (ref 15–41)
Albumin: 2.6 g/dL — ABNORMAL LOW (ref 3.5–5.0)
Albumin: 2.4 g/dL — ABNORMAL LOW (ref 3.5–5.0)
Alkaline Phosphatase: 144 U/L — ABNORMAL HIGH (ref 38–126)
Alkaline Phosphatase: 132 U/L — ABNORMAL HIGH (ref 38–126)
Anion gap: 17 — ABNORMAL HIGH (ref 5–15)
Anion gap: 14 (ref 5–15)
BUN: 111 mg/dL — ABNORMAL HIGH (ref 8–23)
BUN: 115 mg/dL — ABNORMAL HIGH (ref 8–23)
CO2: 18 mmol/L — ABNORMAL LOW (ref 22–32)
CO2: 19 mmol/L — ABNORMAL LOW (ref 22–32)
Calcium: 9 mg/dL (ref 8.9–10.3)
Calcium: 8.8 mg/dL — ABNORMAL LOW (ref 8.9–10.3)
Chloride: 99 mmol/L (ref 98–111)
Chloride: 102 mmol/L (ref 98–111)
Creatinine, Ser: 3.32 mg/dL — ABNORMAL HIGH (ref 0.61–1.24)
Creatinine, Ser: 3.23 mg/dL — ABNORMAL HIGH (ref 0.61–1.24)
GFR, Estimated: 18 mL/min — ABNORMAL LOW (ref >60)
GFR, Estimated: 19 mL/min — ABNORMAL LOW (ref >60)
Glucose, Bld: 144 mg/dL — ABNORMAL HIGH (ref 70–99)
Glucose, Bld: 121 mg/dL — ABNORMAL HIGH (ref 70–99)
Potassium: 3.9 mmol/L (ref 3.5–5.1)
Potassium: 3.9 mmol/L (ref 3.5–5.1)
Sodium: 134 mmol/L — ABNORMAL LOW (ref 135–145)
Sodium: 135 mmol/L (ref 135–145)
Total Bilirubin: 0.6 mg/dL (ref 0.3–1.2)
Total Bilirubin: 0.6 mg/dL (ref 0.3–1.2)
Total Protein: 5.5 g/dL — ABNORMAL LOW (ref 6.5–8.1)
Total Protein: 5.1 g/dL — ABNORMAL LOW (ref 6.5–8.1)

## 2021-10-02 LAB — POCT I-STAT 7, (LYTES, BLD GAS, ICA,H+H)
Acid-base deficit: 5 mmol/L — ABNORMAL HIGH (ref 0.0–2.0)
Acid-base deficit: 3 mmol/L — ABNORMAL HIGH (ref 0.0–2.0)
Bicarbonate: 21.4 mmol/L (ref 20.0–28.0)
Bicarbonate: 22.3 mmol/L (ref 20.0–28.0)
Calcium, Ion: 1.2 mmol/L (ref 1.15–1.40)
Calcium, Ion: 1.23 mmol/L (ref 1.15–1.40)
HCT: 27 % — ABNORMAL LOW (ref 39.0–52.0)
HCT: 28 % — ABNORMAL LOW (ref 39.0–52.0)
Hemoglobin: 9.2 g/dL — ABNORMAL LOW (ref 13.0–17.0)
Hemoglobin: 9.5 g/dL — ABNORMAL LOW (ref 13.0–17.0)
O2 Saturation: 99 %
O2 Saturation: 96 %
Patient temperature: 98.6
Potassium: 3.3 mmol/L — ABNORMAL LOW (ref 3.5–5.1)
Potassium: 4 mmol/L (ref 3.5–5.1)
Sodium: 131 mmol/L — ABNORMAL LOW (ref 135–145)
Sodium: 134 mmol/L — ABNORMAL LOW (ref 135–145)
TCO2: 23 mmol/L (ref 22–32)
TCO2: 24 mmol/L (ref 22–32)
pCO2 arterial: 46.4 mmHg (ref 32–48)
pCO2 arterial: 40.5 mmHg (ref 32–48)
pH, Arterial: 7.273 — ABNORMAL LOW (ref 7.35–7.45)
pH, Arterial: 7.348 — ABNORMAL LOW (ref 7.35–7.45)
pO2, Arterial: 151 mmHg — ABNORMAL HIGH (ref 83–108)
pO2, Arterial: 88 mmHg (ref 83–108)

## 2021-10-02 LAB — MAGNESIUM
Magnesium: 2.4 mg/dL (ref 1.7–2.4)
Magnesium: 2.2 mg/dL (ref 1.7–2.4)

## 2021-10-02 LAB — GLUCOSE, CAPILLARY: Glucose-Capillary: 141 mg/dL — ABNORMAL HIGH (ref 70–99)

## 2021-10-02 LAB — LACTIC ACID, PLASMA
Lactic Acid, Venous: 2.7 mmol/L (ref 0.5–1.9)
Lactic Acid, Venous: 2.7 mmol/L (ref 0.5–1.9)

## 2021-10-02 LAB — TROPONIN I (HIGH SENSITIVITY)
Troponin I (High Sensitivity): 126 ng/L (ref <18)
Troponin I (High Sensitivity): 131 ng/L (ref <18)

## 2021-10-02 LAB — MRSA NEXT GEN BY PCR, NASAL: MRSA by PCR Next Gen: NOT DETECTED

## 2021-10-02 MED ORDER — FENTANYL CITRATE PF 50 MCG/ML IJ SOSY
25.0000 ug | PREFILLED_SYRINGE | INTRAMUSCULAR | Status: DC | PRN
  Administered 2021-10-02 (×2): 50 ug via INTRAVENOUS
  Filled 2021-10-02 (×2): qty 1

## 2021-10-02 MED ORDER — MORPHINE BOLUS VIA INFUSION
5.0000 mg | INTRAVENOUS | Status: DC | PRN
  Administered 2021-10-02 – 2021-10-03 (×2): 5 mg via INTRAVENOUS
  Filled 2021-10-02: qty 5

## 2021-10-02 MED ORDER — FENTANYL CITRATE PF 50 MCG/ML IJ SOSY
25.0000 ug | PREFILLED_SYRINGE | INTRAMUSCULAR | Status: DC | PRN
  Administered 2021-10-02: 25 ug via INTRAVENOUS

## 2021-10-02 MED ORDER — FENTANYL 2500MCG IN NS 250ML (10MCG/ML) PREMIX INFUSION
0.0000 ug/h | INTRAVENOUS | Status: DC
  Administered 2021-10-02: 25 ug/h via INTRAVENOUS
  Filled 2021-10-02: qty 250

## 2021-10-02 MED ORDER — HALOPERIDOL LACTATE 5 MG/ML IJ SOLN: 2.5000 mg | INTRAMUSCULAR | Status: DC | PRN

## 2021-10-02 MED ORDER — MIDAZOLAM HCL 2 MG/2ML IJ SOLN: 2.0000 mg | INTRAMUSCULAR | Status: DC | PRN

## 2021-10-02 MED ORDER — MORPHINE SULFATE (PF) 2 MG/ML IV SOLN: 2.0000 mg | INTRAVENOUS | Status: DC | PRN

## 2021-10-02 MED ORDER — FENTANYL BOLUS VIA INFUSION
50.0000 ug | INTRAVENOUS | Status: DC | PRN
  Filled 2021-10-02: qty 100

## 2021-10-02 MED ORDER — DOCUSATE SODIUM 50 MG/5ML PO LIQD: 100.0000 mg | Freq: Two times a day (BID) | ORAL | Status: DC

## 2021-10-02 MED ORDER — NOREPINEPHRINE 4 MG/250ML-% IV SOLN
2.0000 ug/min | INTRAVENOUS | Status: DC
  Administered 2021-10-02: 7 ug/min via INTRAVENOUS
  Filled 2021-10-02: qty 250

## 2021-10-02 MED ORDER — LIDOCAINE 5 % EX PTCH
1.0000 | MEDICATED_PATCH | CUTANEOUS | Status: DC
  Administered 2021-10-02: 1 via TRANSDERMAL
  Filled 2021-10-02: qty 1

## 2021-10-02 MED ORDER — CHLORHEXIDINE GLUCONATE 0.12% ORAL RINSE (MEDLINE KIT)
15.0000 mL | Freq: Two times a day (BID) | OROMUCOSAL | Status: DC
  Administered 2021-10-02 (×2): 15 mL via OROMUCOSAL

## 2021-10-02 MED ORDER — GLYCOPYRROLATE 1 MG PO TABS
1.0000 mg | ORAL_TABLET | ORAL | Status: DC | PRN
  Filled 2021-10-02: qty 1

## 2021-10-02 MED ORDER — GLYCOPYRROLATE 0.2 MG/ML IJ SOLN
0.2000 mg | INTRAMUSCULAR | Status: DC | PRN
Start: 2021-10-02 — End: 2021-10-03

## 2021-10-02 MED ORDER — SODIUM CHLORIDE 0.9 % IV SOLN
250.0000 mL | INTRAVENOUS | Status: DC
  Administered 2021-10-02: 250 mL via INTRAVENOUS

## 2021-10-02 MED ORDER — POLYETHYLENE GLYCOL 3350 17 G PO PACK: 17.0000 g | PACK | Freq: Every day | ORAL | Status: DC

## 2021-10-02 MED ORDER — GLYCOPYRROLATE 0.2 MG/ML IJ SOLN: 0.2000 mg | INTRAMUSCULAR | Status: DC | PRN

## 2021-10-02 MED ORDER — POLYVINYL ALCOHOL 1.4 % OP SOLN
1.0000 [drp] | Freq: Four times a day (QID) | OPHTHALMIC | Status: DC | PRN
  Filled 2021-10-02: qty 15

## 2021-10-02 MED ORDER — MIDAZOLAM HCL 2 MG/2ML IJ SOLN
2.0000 mg | INTRAMUSCULAR | Status: DC | PRN
  Administered 2021-10-02: 2 mg via INTRAVENOUS
  Filled 2021-10-02: qty 2

## 2021-10-02 MED ORDER — DEXMEDETOMIDINE HCL IN NACL 400 MCG/100ML IV SOLN
INTRAVENOUS | Status: AC
  Filled 2021-10-02: qty 100

## 2021-10-02 MED ORDER — DEXTROSE 5 % IV SOLN: INTRAVENOUS | Status: DC

## 2021-10-02 MED ORDER — MORPHINE 100MG IN NS 100ML (1MG/ML) PREMIX INFUSION
0.0000 mg/h | INTRAVENOUS | Status: DC
  Administered 2021-10-02: 5 mg/h via INTRAVENOUS
  Administered 2021-10-03: 10 mg/h via INTRAVENOUS
  Filled 2021-10-02 (×2): qty 100

## 2021-10-02 MED ORDER — IPRATROPIUM-ALBUTEROL 0.5-2.5 (3) MG/3ML IN SOLN: 3.0000 mL | Freq: Four times a day (QID) | RESPIRATORY_TRACT | Status: DC

## 2021-10-02 MED ORDER — DIPHENHYDRAMINE HCL 50 MG/ML IJ SOLN: 25.0000 mg | INTRAMUSCULAR | Status: DC | PRN

## 2021-10-02 MED ORDER — DEXMEDETOMIDINE HCL IN NACL 400 MCG/100ML IV SOLN
0.0000 ug/kg/h | INTRAVENOUS | Status: DC
  Administered 2021-10-02: 0.4 ug/kg/h via INTRAVENOUS

## 2021-10-02 MED ORDER — ORAL CARE MOUTH RINSE
15.0000 mL | OROMUCOSAL | Status: DC
  Administered 2021-10-02 (×5): 15 mL via OROMUCOSAL

## 2021-10-02 NOTE — Code Documentation (Signed)
Code blue was called at 1233. When the IMTS team arrived chest compressions were underway for 5 minutes, patient had received 1x dose of epinephrine.  ? ?Floor nursing team stated that they received a message that patient's leads were off. When checking on patient he was unresponsive and was noted to not have a pulse. As previously stated chest compressions were initiated. He received 1x dose of epinephrine. ROSC was quickly achieved at 1240. On the monitor he was noticed to be in atrial fibrillation with heart rate in 130s. His blood pressure was in the 728A systolic. Patient was transported to [facility].  ?

## 2021-10-02 NOTE — Progress Notes (Signed)
Patient extubated and DNR orders placed.  [facility] referral initiated, referral number [phone] ?

## 2021-10-02 NOTE — Significant Event (Signed)
Nurse heard monitor alarming in patient room and proceeded to check on patient. Upon entry patient was naked having removed gown, telemetry leads, pulse ox, and oxygen tubing. Patient unresponsive. Called duress alarm for additional staff assistance while checking for pulse. No pulse found; 1 or 2 gasps of air. Code called at 00:33. CPR initiated. One dose '1mg'$  epinephrine given. Patient intubated with assisted breathing. BP check with systolic in 244Q. Noted Afib, as was previously, on telemetry via defibrillator monitor. ROSC achieved at 12:40. Called 2H for transfer for higher level of care. Report given and patient rolled at conclusion of code at 12:52. Called and briefly spoke with significant other. She asked nurse to call patient's brother. Attempted to call mobile phone but straight to voicemail. Called significant other back to notify her. She stated she had another number and would try his home phone. Received call from patient's brother several minutes later. Gave quick explanation/update and gave information to call ICU. Family sounded very concerned.   ? ?

## 2021-10-02 NOTE — Progress Notes (Addendum)
?Daily Progress Note  ? ?Patient Name: Aaron Klein       Date: 10/02/2021 ?DOB: May 19, 1946  Age: 76 y.o. MRN#: [id number] ?Attending Physician: Julian Hy, DO ?Primary Care Physician: Aletha Halim., PA-C ?Admit Date: 10/11/2021 ?Length of Stay: 4 days ? ?Reason for Consultation/Follow-up: Establishing goals of care and Withdrawal of life-sustaining treatment ? ?HPI/Patient Profile:  laryngeal cancer in remission, GERD, chronic low back pain, HLD, present in [facility] ED on 4/5 w/ weakness. Found to have UTI, developed hypoxic respiratory failure requiring 9l O2 per [place] likely aspiration pneumonia. The patient elected partial code: No CPR, NO intubation; ok with defib, BiPap, ACLS medications. ? ?PMT was consulted for [place] conversations.  ? ?Last night a nurse went to check on the patient because leads were off, patient found unresponsive and pulseless so a CODE BLUE was called and the patient received CPR, epi x 1, intubation. Transferred to [facility] ICU. Per PCCM note last night the patient wanted to be extubated but found likely w/ flail chest from CPR/broken ribs and agreeable to remain intubated with sedation overnight. Request for PMT to readdress GOC in the morning. ? ?Subjective:  ? ?Subjective: ?Chart Reviewed. Updates received. Patient Assessed. Created space and opportunity for patient  and family to explore thoughts and feelings regarding current medical situation. ? ?Today's Discussion: Today I met with the patient at the bedside, patient's RN was present as well. Assisted RN in turning, cleaning the patient after bedpan use. He remains intubated and therefore speech is not possible. However, he is awake, alert and able to shake his head yes/no to questions. I shared with him I understand he did not want a breathing tube to which he shook "no". I shared he has indicated to the staff he wants the breathing tube removed, to which he shook his head "yes." I explained that removing the breathing tube, with the  broken ribs from CPR and apparnet flail chest could mean he would pass away from difficulty breathing and would he still want the breathing tube removed knowing he could die; he shook his head "yes." I asked if he was having any pain, he shook "yes" and lifted his hand to point to his chest/ribs. I asked him if he wants his son to make his decisions if he cannot make them for himself and he again shook "yes."  I asked him if we remove the breathing tube and he is not doing well and having trouble breathing would he be okay with [place] changing him to comfort care where we use medications to control his comfort and allow him to pass naturally.  He shook his head "yes". ? ?At that point I asked him if I could bring his son in the room to allow him to know this information, which he shook his head "yes." I brought his son in the room and repeated the conversation we just had with the patient providing the same answers.  The patient's son shared that he is able to carry out his father's wishes as he knows them which would be no breathing tube and no CPR.  He shared that while he does not want his father to pass away he does not want him to suffer and he wants to respect his wishes.  I allowed them to have private time with each other. ? ?I discussed the situation with PCCM Dr. Carlis Abbott and explained the conversations that we had.  She seems in agreement.  She indicates that the family  seems to be unified in their agreement with this.  She shared that the patient's brother is having a hard time with the situation because they are close.  They are waiting for 1 additional family member to arrive shortly at which point they would want to proceed.  I shared that the patient's son did tell me they are ready to remove the breathing tube whenever the medical team is ready.  Dr. Carlis Abbott indicated she would see them and order the compassionate extubation.  She plans to start a fentanyl drip for comfort. ? ?I provided emotional general  support through therapeutic listening, empathy, and other techniques.  Answered all questions and addressed all concerns to the best my ability. ? ?ROS Limited due to intubation ?Review of Systems  ?Cardiovascular:  Positive for chest pain (s/p CPR).  ? ?Objective:  ? ?Vital Signs:  ?BP 123/84   Pulse (!) 123   Temp 97.8 ?F (36.6 ?C) (Axillary)   Resp (!) 23   Ht '5\' 9"'  (1.753 m)   Wt 86.4 kg   SpO2 96%   BMI 28.13 kg/m?  ? ?Physical Exam: ?Physical Exam ?Vitals and nursing note reviewed.  ?Constitutional:   ?   General: He is not in acute distress. ?   Appearance: He is ill-appearing.  ?HENT:  ?   Head: Normocephalic and atraumatic.  ?Cardiovascular:  ?   Rate and Rhythm: Tachycardia present.  ?Pulmonary:  ?   Effort: No respiratory distress.  ?Abdominal:  ?   General: Abdomen is flat.  ?   Palpations: Abdomen is soft.  ?Skin: ?   General: Skin is warm and dry.  ?Neurological:  ?   Mental Status: He is alert.  ?Psychiatric:     ?   Mood and Affect: Mood normal.     ?   Behavior: Behavior normal.  ? ? ?Palliative Assessment/Data: 10% (intubated) ? ? ?Assessment & Plan:  ? ?Impression: ?Present on Admission: ? Severe sepsis ([place]) ? Malignant neoplasm of glottis ([place]) ? AKI (acute kidney injury) ([place]) ? Thrombocytopenia ([place]) ? Elevated LFTs ? Aspiration pneumonia ([place]) ? Typical atrial flutter ([place]) ? Bacteremia due to Klebsiella pneumoniae ? Acute respiratory failure with hypoxia ([place]) ? Hyperkalemia ? Hyponatremia ? Low back pain ? Metabolic acidosis ? 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 Hydronephrosis of right kidney ? ?76 year old male with multiple chronic issues admitted for UTI sepsis.  The patient was a partial code (no CPR, no intubation).  However, he coded on the MedSurg floor and CPR with intubation was initiated.  He was transferred to ICU.  At this point he is wanting breathing tube removed and is excepting that this may result in his death.  He is okay with transition  to comfort care if symptoms are not able to be controlled status post intubation.  He wants his decision-maker to be his son should he get to the point that he cannot make his own decisions.  His son is fully supportive of his father's wishes.  At this point it appears the plan is to proceed with compassionate extubation to high flow nasal cannula to allow him the best chance of survival, excepting that with broken ribs and flail chest he likely will not be able to breathe deep enough to support his body and will likely decline and passed away. ? ?SUMMARY OF RECOMMENDATIONS   ?Compassionate extubation per PCCM ?DNR ?Continued patient and family support ?Transition to comfort care if respiratory distress despite high  flow nasal cannula ? ?Symptom Management:  ?Per PCCM ?PMT is available to assist as needed ? ?Code Status: DNR ? ?Prognosis: Hours - Days ? ?Discharge Planning: Anticipated Hospital Death ? ?Discussed with: Medical team, nursing team, patient, patient's family ? ?Thank you for allowing [place] to participate in the care of [facility] ?PMT will continue to support holistically. ? ?Billing based on MDM: High ? ?Problems Addressed: One acute or chronic illness or injury that poses a threat to life or bodily function ? ?Amount and/or Complexity of Data: Category 1:Review of prior external note(s) from each unique source and Review of the result(s) of each unique test (reviewed currnet labs including CBC, CMP, Lactic, chest XRay, CT Head; reviewed last family medicine AWV noted dated 06/28/21) ? ?Risks: Decision not to resuscitate or to de-escalate care because of poor prognosis ? ? ?Walden Field, NP ?Palliative Medicine Team ? ?Team Phone # [phone] (Nights/Weekends) ? 02/21/2021, 8:17 AM  ? ?

## 2021-10-02 NOTE — Progress Notes (Signed)
Seen at bedside after 1-way extubation. Has had persistently increased respiratory effort requiring fent gtt and PRN BZDs. ?D/w family that current resp sx not well controlled on current med dosing, talked about alleviating sx of SOB at EOL. Emotional support provided.  ? ?Full comfort care orders placed  ?Change to a Morphine gtt + PRN ?While awaiting morphine, increased ceiling on fent gtt + bolus off of bag.  ? ? ? ?Eliseo Gum MSN, AGACNP-BC ?[facility] ?Amion for pager  ?10/02/2021, 3:28 PM ? ?

## 2021-10-02 NOTE — Progress Notes (Signed)
? ?NAME:  Aaron Klein, MRN:  [id number], DOB:  Oct 25, 1945, LOS: 4 ?ADMISSION DATE:  10/22/2021, CONSULTATION DATE:  4/10 ?REFERRING MD:  Dr. Posey Pronto, CHIEF COMPLAINT:  s/p cardiac arrest  ? ?History of Present Illness:  ?Patient is a 76 yo M w/ pertinent PMH of laryngeal cancer in remission, GERD, chronic low back pain, HLD, present in [facility] ED on 4/5 w/ weakness. ? ?Patient weakness noticed for a few days prior to admission. Went to [facility] med center 4/5. Noted to have fever 99 F. Tachycardic. Soft BP. CXR showing possible pneumonia. Covid/flu negative. Labs showing AKI, hyponatremia, hyperkalemia, AG acidosis, hypercalcemia, elevated LFTs, elevated bnp, severe thrombocytopenia. Started on abx for possible pneumonia. LA intiially 6.7 which improved with IV fluids. ? ?Further workup showed patient to have sepsis due to Klebsiella bacteremia likely from UTI. Patient also developed hypoxic respiratory failure requiring 9L [place] from aspiration pneumonia. EKG w/ a flutter. Palliative care following and was made partial code: dni/no compressions. ? ?On 4/10, Nurse went to check on patient because lead's were off. Found patient unresponsive and pulseless. Code blue called at [place]. CPR started and received 1 epi. Rosc 1240. Post arrest rhythm appeared to be afib with rate 130s. Sbp 140s. Transported to [facility] ICU. PCCM consulted. ? ?Pertinent  Medical History  ? ?Past Medical History:  ?Diagnosis Date  ? Arthritis   ? Cancer [facility])   ? larynx  ? ED (erectile dysfunction)   ? GERD (gastroesophageal reflux disease)   ? History of hiatal hernia   ? Hyperlipidemia   ? Irregular heart beat   ? Scoliosis   ? ? ? ?Significant Hospital Events: ?Including procedures, antibiotic start and stop dates in addition to other pertinent events   ?4/5:admitted to [facility] with sepsis ?4/10: cardiac arrest on floors; was partial code: no compresison/intubation; intubated and cpr for 8 minutes until ROSC unknown cause; transferred to ICU; pccm  consulted ? ?Interim History / Subjective:  ?This morning he wants to be extubated. ? ?Objective   ?Blood pressure 99/69, pulse 83, temperature (!) 97.5 ?F (36.4 ?C), temperature source Axillary, resp. rate (!) 0, height '5\' 9"'$  (1.753 m), weight 86.4 kg, SpO2 96 %. ?   ?Vent Mode: PRVC ?FiO2 (%):  [48 %-100 %] 50 % ?Set Rate:  [14 bmp-20 bmp] 20 bmp ?Vt Set:  [560 mL] 560 mL ?PEEP:  [5 cmH20] 5 cmH20  ? ?Intake/Output Summary (Last 24 hours) at 10/02/2021 0747 ?Last data filed at 10/02/2021 0500 ?Gross per 24 hour  ?Intake 343.34 ml  ?Output 500 ml  ?Net -156.66 ml  ? ? ?Filed Weights  ? 10/22/2021 0430 09/29/21 0355 10/02/21 0500  ?Weight: 76.3 kg 78.6 kg 86.4 kg  ? ? ?Examination: ?General: Critically ill-appearing man lying in bed in mild respiratory distress on SBT ?HEENT: 2/AT, eyes anicteric, endotracheal tube in place ?Neuro: Wake and alert, nodding to answer questions, moving all extremities, globally weak ?CV: 1 S2, tachycardic, irregular rhythm ?PULM: Chemical breath sounds bilaterally, low tidal volumes in the 250cc range on 5/5 ?GI: Soft, nondistended ?Extremities: No peripheral edema ?Skin: Warm, dry, no diffuse rashes ? ? ?BUN 115 ?Cr 3.23 ?LA 2.7 ?WBC 5 ?H/H 8.7/25.8 ?Platelets 20 ?Urine culture 4/6> K pneumoniae ?Blood cultures 4/5> K pneuoniae ? ?Resolved Hospital Problem list   ? ? ?Assessment & Plan:  ? ?S/p cardiac arrest: unknown cause, potentially respiratory arrest ?-Goals of care discussions today.  ? ?Anisocoria: family states he had retinal surgery a few months prior to  admission ?-No additional follow-up needed ? ?Acute hypoxic respiratory failure: intubated during cardiac arrest ?-Low tidal volume ventilation ?- Failed SBT today due to low volumes and tachypnea. ?- Goals of care discussions.  Patient would like to be extubated.  Ongoing discussions with family- son, brother, girlfriend at bedside today. ?-titrate FiO2 to maintain Sp O2>90% ?-VAP prevention protocol ? ?Severe sepsis ?Aspiration  pneumonia ?Klebsiella bacteremia and UTI ?P: ?-con't ceftriaxone and flagyl ? ?AKI ?Hyperkalemia, hyponatremia, hypocalcemia ?Metabolic acidosis ?Acute hypoxic respiratory failure ?R kidney stone causing obstructive hydronephrosis ?Acute metabolic acidosis due to renal failure ?P: ?-GU stent placed 4/6; appreciate Urology's management ?-foley removed 4/9 per urology ?-con't to monitor renal function ?--Strict I's/O ?-con't enteral bicarb when able to take PO; currently no NGT. ? ?Lactic acidosis likely due to cardiac arrest ?-recheck this morning ? ?Atrial flutter ?Acute on chronic systolic HF: EF 16-10% w/ global hypokinesis; mod enlarged RV ?P: ?-Telemetry monitoring ?- Unable to tolerate rate control medications rate ?Vasopressors ?- Hold anticoagulation due to severe thrombocytopenia ? ?Thrombocytopenia: workup negative for DIC, TTP, HUS. ?This is secondary to sepsis or antibiotics ?P: ?-hematology consulted by [facility]; prescient their recommendations ?- Continue to hold DVT prophylaxis and full anticoagulation for a flutter ?-Discontinue antibiotics as soon as feasible ? ?Elevated transaminase levels, likely due to sepsis ?P: ?-Monitor ? ?Chronic low back pain, history of scoliosis ?P: ?-precedex and prn fentanyl while intubated ?-oxy not able to be given since no OGT able to be placed ; will have to assess ability to swallow after ?-lidocaine patch ?-hold flexiril ? ?GoC ?-Son, brother, s/o at bedside. Son has indicated that his father does not want to remain intubated and they are waiting on 1 more family member to come see him. Planning tentatively for terminal extubation this afternoon. Will plan for extubation to [facility], but family is aware that I do not think it is likely that he will tolerate being off the vent long without respiratory distress. ? ? ?Best Practice (right click and "Reselect all SmartList Selections" daily)  ? ?Diet/type: NPO w/ meds via tube ?DVT prophylaxis: SCD ?GI prophylaxis: PPI ?Lines:  N/A ?Foley:  N/A ?Code Status:  limited; partial code (DNI/no compressions) ?Last date of multidisciplinary goals of care discussion [4/10 Dr. Lake Bells updated son over phone; patient alert and wants breathing tube out but we stated that he could possibly die if we take it out now he nodded that he was okay with keeping ETT for now until it is safe to do so. Will need further [place] discussion; palliative following] ? ?Labs   ?CBC: ?Recent Labs  ?Lab 10/22/2021 ?0342 09/30/2021 ?1008 10/05/2021 ?2350 09/29/21 ?9604 09/30/21 ?5409 10/01/21 ?8119 10/02/21 ?1478 10/02/21 ?0143 10/02/21 ?0253  ?WBC 6.2  --  5.5 5.9 4.7 3.8* 7.4  --  5.0  ?NEUTROABS 4.6  --  5.1  --  3.7 2.9 4.1  --   --   ?HGB 12.1*  --  11.5* 11.4* 10.5* 10.1* 10.4* 9.2* 8.7*  ?HCT 34.8*  --  32.7* 34.2* 30.2* 29.4* 30.6* 27.0* 25.8*  ?MCV 83.3  --  84.3 85.7 85.6 85.5 86.9  --  85.7  ?PLT 46*   < > 26* 23* 20* 20* 25*  --  20*  ? < > = values in this interval not displayed.  ? ? ? ?Basic Metabolic Panel: ?Recent Labs  ?Lab 09/24/2021 ?2350 09/29/21 ?0826 09/29/21 ?1633 09/30/21 ?2956 10/01/21 ?2130 10/02/21 ?8657 10/02/21 ?0143 10/02/21 ?0253  ?NA 131* 132*  --  133* 134* 134* 131* 135  ?K 6.0* 5.6*   < > 5.3* 4.2 3.9 3.3* 3.9  ?CL 100 102  --  101 102 99  --  102  ?CO2 18* 16*  --  19* 19* 18*  --  19*  ?GLUCOSE 59* 77  --  79 89 144*  --  121*  ?BUN 74* 81*  --  98* 105* 111*  --  115*  ?CREATININE 3.95* 3.86*  --  3.83* 3.41* 3.32*  --  3.23*  ?CALCIUM 8.9 9.0  --  9.0 8.9 9.0  --  8.8*  ?MG 2.1  --   --  2.3 2.3 2.4  --  2.2  ? < > = values in this interval not displayed.  ? ? ?GFR: ?Estimated Creatinine Clearance: 20.9 mL/min (A) (by C-G formula based on SCr of 3.23 mg/dL (H)). ?Recent Labs  ?Lab 10/06/2021 ?7564 10/17/2021 ?0431 09/30/2021 ?3329 09/29/2021 ?2350 09/30/21 ?5188 10/01/21 ?4166 10/02/21 ?0630 10/02/21 ?0253 10/02/21 ?1601  ?PROCALCITON >150.00  --   --   --   --   --   --   --   --   ?WBC 6.2  --   --    < > 4.7 3.8* 7.4 5.0  --   ?LATICACIDVEN  --   3.7* 3.9*  --   --   --   --  2.7* 2.7*  ? < > = values in this interval not displayed.  ? ? ? ?Liver Function Tests: ?Recent Labs  ?Lab 09/29/21 ?0826 09/30/21 ?0415 10/01/21 ?0314 10/02/21 ?0932 10/02/21 ?0253  ?AST

## 2021-10-02 NOTE — Consult Note (Addendum)
? ?NAME:  Aaron Klein, MRN:  [id number], DOB:  1945-12-13, LOS: 4 ?ADMISSION DATE:  10/19/2021, CONSULTATION DATE:  4/10 ?REFERRING MD:  Dr. Posey Pronto, CHIEF COMPLAINT:  s/p cardiac arrest  ? ?History of Present Illness:  ?Patient is a 76 yo M w/ pertinent PMH of laryngeal cancer in remission, GERD, chronic low back pain, HLD, present in [facility] ED on 4/5 w/ weakness. ? ?Patient weakness noticed for a few days prior to admission. Went to [facility] med center 4/5. Noted to have fever 99 F. Tachycardic. Soft BP. CXR showing possible pneumonia. Covid/flu negative. Labs showing AKI, hyponatremia, hyperkalemia, AG acidosis, hypercalcemia, elevated LFTs, elevated bnp, severe thrombocytopenia. Started on abx for possible pneumonia. LA intiially 6.7 which improved with IV fluids. ? ?Further workup showed patient to have sepsis due to Klebsiella bacteremia likely from UTI. Patient also developed hypoxic respiratory failure requiring 9L  from aspiration pneumonia. EKG w/ a flutter. Palliative care following and was made partial code: dni/no compressions. ? ?On 4/10, Nurse went to check on patient because lead's were off. Found patient unresponsive and pulseless. Code blue called at [place]. CPR started and received 1 epi. Rosc 1240. Post arrest rhythm appeared to be afib with rate 130s. Sbp 140s. Transported to [facility] ICU. PCCM consulted. ? ?Pertinent  Medical History  ? ?Past Medical History:  ?Diagnosis Date  ? Arthritis   ? Cancer [facility])   ? larynx  ? ED (erectile dysfunction)   ? GERD (gastroesophageal reflux disease)   ? History of hiatal hernia   ? Hyperlipidemia   ? Irregular heart beat   ? Scoliosis   ? ? ? ?Significant Hospital Events: ?Including procedures, antibiotic start and stop dates in addition to other pertinent events   ?4/5:admitted to [facility] with sepsis ?4/10: cardiac arrest on floors; was partial code: no compresison/intubation; intubated and cpr for 8 minutes until ROSC unknown cause; transferred to ICU; pccm  consulted ? ?Interim History / Subjective:  ?Patient intubated on mech vent; desats when trying to lower fio2 ?Patient is AO and following commands; L pupil 4 mm and R pupil 10m ? ?Objective   ?Blood pressure (!) 119/99, pulse (!) 120, temperature (!) 97.5 ?F (36.4 ?C), temperature source Axillary, resp. rate 20, height '5\' 9"'$  (1.753 m), weight 78.6 kg, SpO2 96 %. ?   ?Vent Mode: PRVC ?FiO2 (%):  [48 %-100 %] 100 % ?Set Rate:  [14 bmp] 14 bmp ?Vt Set:  [560 mL] 560 mL ?PEEP:  [5 cmH20] 5 cmH20  ? ?Intake/Output Summary (Last 24 hours) at 10/02/2021 0137 ?Last data filed at 10/01/2021 1500 ?Gross per 24 hour  ?Intake 200 ml  ?Output 850 ml  ?Net -650 ml  ? ?Filed Weights  ? 09/25/2021 0038 10/13/2021 0430 09/29/21 0355  ?Weight: 76.3 kg 76.3 kg 78.6 kg  ? ? ?Examination: ?General:  ill appearing elderly male on mech vent ?HEENT: MM pink/moist; ETT in place ?Neuro: Ao; MAE; L pupil 478mand R pupil 67m55mCV: s1s2, irregular tachy 110s, no m/r/g ?PULM:  dim clear BS bilaterally; on mech vent PRVC ?GI: soft, bsx4 active  ?Extremities: warm/dry, no edema  ?Skin: no rashes or lesions appreciated ? ? ?Resolved Hospital Problem list   ? ? ?Assessment & Plan:  ? ?S/p cardiac arrest: unknown cause ?P: ?-will admit to ICU w/ continuous telemetry monitoring ?-BP stable not requiring pressors ?-trend troponin and lactate ?-check ABG ?-check CMP, Mag, ionized calcium: replete electrolytes as needed ?-check CXR ?-check EKG ? ?Anisocoria: family states  he had retinal surgery a few months prior to admission ?P: ?-CT head ?-neuro checks ? ?Acute hypoxic respiratory failure: intubated during cardiac arrest ?P: ?-cont mech vent prvc 6-8 cc/kg ?-wean fio2 for sats >92% ?-VAP prevention in place ?-send trach culture ?-check CXR ?-sedation for RASS 0 to -1 ?-abg; adjust settings accordingly ?-daily sbt/sat  ? ?Severe sepsis ?Aspiration pneumonia and UTI ?Klebsiella bacteremia ?P: ?-continue rocephin and flagyl ?-follow bc ?-follow UC ?-check  tracheal aspirate ? ?AKI ?Hyperkalemia, hyponatremia, hypocalcemia ?Metabolic acidosis ?Acute hypoxic respiratory failure ?R kidney stone ?Hydronephrosis ?P: ?-urology consulted and stent placed 4/6 ?-foley removed 4/9 per urology ?-Trend BMP / urinary output ?-Replace electrolytes as indicated ?-Avoid nephrotoxic agents, ensure adequate renal perfusion ? ?Atrial Flutter ?Acute on chronic systolic HF: EF 51-88% w/ global hypokinesis; mod enlarged RV ?P: ?-telemetry monitoring ?-currently rate controlled ?-no AC due to severe thrombocytopenia ?-daily weight/strict I/o's ? ?Thrombocytopenia: workup negative for DIC, TTP, HUS ?P: ?-hematology consulted by [facility] ?-trend cbc ? ?Elevated LFTs ?P: ?-trend CMP ? ?Chronic low back pain ?P: ?-precedex and prn fentanyl while intubated ?-hold flexiril, oxy, lidocaine patch ? ?Best Practice (right click and "Reselect all SmartList Selections" daily)  ? ?Diet/type: NPO w/ meds via tube ?DVT prophylaxis: SCD ?GI prophylaxis: PPI ?Lines: N/A ?Foley:  N/A ?Code Status:  limited; partial code (DNI/no compressions) ?Last date of multidisciplinary goals of care discussion [4/10 Dr. Lake Bells updated son over phone; patient alert and wants breathing tube out but we stated that he could possibly die if we take it out now he nodded that he was okay with keeping ETT for now until it is safe to do so. Will need further [place] discussion; palliative following] ? ?Labs   ?CBC: ?Recent Labs  ?Lab 09/24/2021 ?0342 10/14/2021 ?1008 10/11/2021 ?2350 09/29/21 ?4166 09/30/21 ?0630 10/01/21 ?1601 10/02/21 ?0932  ?WBC 6.2  --  5.5 5.9 4.7 3.8* 7.4  ?NEUTROABS 4.6  --  5.1  --  3.7 2.9 4.1  ?HGB 12.1*  --  11.5* 11.4* 10.5* 10.1* 10.4*  ?HCT 34.8*  --  32.7* 34.2* 30.2* 29.4* 30.6*  ?MCV 83.3  --  84.3 85.7 85.6 85.5 86.9  ?PLT 46*   < > 26* 23* 20* 20* 25*  ? < > = values in this interval not displayed.  ? ? ?Basic Metabolic Panel: ?Recent Labs  ?Lab 10/02/2021 ?2350 09/29/21 ?0826 09/29/21 ?1633 09/29/21 ?2017  09/30/21 ?0415 10/01/21 ?3557 10/02/21 ?3220  ?NA 131* 132*  --   --  133* 134* 134*  ?K 6.0* 5.6* 5.6* 5.4* 5.3* 4.2 3.9  ?CL 100 102  --   --  101 102 99  ?CO2 18* 16*  --   --  19* 19* 18*  ?GLUCOSE 59* 77  --   --  79 89 144*  ?BUN 74* 81*  --   --  98* 105* 111*  ?CREATININE 3.95* 3.86*  --   --  3.83* 3.41* 3.32*  ?CALCIUM 8.9 9.0  --   --  9.0 8.9 9.0  ?MG 2.1  --   --   --  2.3 2.3 2.4  ? ?GFR: ?Estimated Creatinine Clearance: 18.6 mL/min (A) (by C-G formula based on SCr of 3.32 mg/dL (H)). ?Recent Labs  ?Lab 09/26/2021 ?1804 10/20/2021 ?1956 10/01/2021 ?2542 09/25/2021 ?7062 10/17/2021 ?3762 10/14/2021 ?2350 09/29/21 ?8315 09/30/21 ?1761 10/01/21 ?6073 10/02/21 ?7106  ?PROCALCITON  --   --  >150.00  --   --   --   --   --   --   --   ?  WBC 8.0  --  6.2  --   --    < > 5.9 4.7 3.8* 7.4  ?LATICACIDVEN 6.7* 4.9*  --  3.7* 3.9*  --   --   --   --   --   ? < > = values in this interval not displayed.  ? ? ?Liver Function Tests: ?Recent Labs  ?Lab 10/08/2021 ?2350 09/29/21 ?0826 09/30/21 ?0415 10/01/21 ?9935 10/02/21 ?7017  ?AST 108* 94* 67* 59* 69*  ?ALT 51* 48* 41 35 34  ?ALKPHOS 97 94 85 123 144*  ?BILITOT 0.6 0.9 1.0 0.8 0.6  ?PROT 6.0* 5.9* 5.9* 5.5* 5.5*  ?ALBUMIN 2.3* 2.3* 2.4* 2.5* 2.6*  ? ?No results for input(s): LIPASE, AMYLASE in the last 168 hours. ?No results for input(s): AMMONIA in the last 168 hours. ? ?ABG ?   ?Component Value Date/Time  ? PHART 7.25 (L) 09/29/2021 1054  ? PCO2ART 38 09/29/2021 1054  ? PO2ART 73 (L) 09/29/2021 1054  ? HCO3 16.7 (L) 09/29/2021 1054  ? TCO2 20 (L) 10/04/2021 1911  ? ACIDBASEDEF 9.8 (H) 09/29/2021 1054  ? O2SAT 94.5 09/29/2021 1054  ?  ? ?Coagulation Profile: ?Recent Labs  ?Lab 10/06/2021 ?1804 10/16/2021 ?1008  ?INR 1.2 1.4*  ? ? ?Cardiac Enzymes: ?Recent Labs  ?Lab 10/20/2021 ?7939 10/17/2021 ?0342  ?CKTOTAL 400* 247  ? ? ?HbA1C: ?No results found for: HGBA1C ? ?CBG: ?Recent Labs  ?Lab 10/02/21 ?0112  ?GLUCAP 141*  ? ? ?Review of Systems:   ?Patient is encephalopathic and/or intubated.  Therefore history has been obtained from chart review.  ? ? ?Past Medical History:  ?He,  has a past medical history of Arthritis, Cancer ([place]), ED (erectile dysfunction), GERD (gastroesophageal reflux disease), History of h

## 2021-10-02 NOTE — Progress Notes (Signed)
Attending: ?  ? ?Subjective: ?76 y/o male with multiple medical problems admitted on 4/5 with generalized weakness, noted to have sepsis physiology, elevated lactic acid.  Blood culture was noted to be growing Klebsiella.  He was found to have an obstructive right hydronephrosis from right nephrolithiasis.  He underwent stent placement by urology on 4/6.  Since then the patient has been followed by urology, hematology for pancytopenia, palliative medicine.  Palliative medicine has been working with him and his CODE STATUS is listed as a partial code: No intubation or CPR in the event of a cardiac arrest but medications would be okay, BiPAP okay, cardiac shocks okay. ? ?Early in the morning on 4/10 he was noted to be unresponsive, his leads were off.  CPR was initiated, epinephrine given, he received about 10 minutes of CPR.  He was intubated and transferred to the intensive care unit.  Not long after transfer he was noted to be fully awake demanding to have his breathing tube removed.  Pulmonary and critical care medicine was consulted. ? ?On my evaluation we switched him to pressure support ventilation to see if he could be extubated but he was noted to have flail chest and was somewhat tachypneic.  I discussed options with him.  I explained that we could extubate but there is a good chance that he would develop difficulty breathing considering what I was seeing.  I offered sedatives in the form of Precedex and as needed fentanyl with a brief work-up to try to get to the bottom of what is going on.  He said he would prefer this option. ? ?Past Medical History:  ?Diagnosis Date  ? Arthritis   ? Cancer [facility])   ? larynx  ? ED (erectile dysfunction)   ? GERD (gastroesophageal reflux disease)   ? History of hiatal hernia   ? Hyperlipidemia   ? Irregular heart beat   ? Scoliosis   ? ? ? ?Objective: ?Vitals:  ? 10/01/21 1448 10/01/21 2053 10/01/21 2100 10/02/21 0106  ?BP:  (!) 119/99    ?Pulse:  86  (!) 120  ?Resp:  20   20  ?Temp:  98 ?F (36.7 ?C)  (!) 97.5 ?F (36.4 ?C)  ?TempSrc:  Oral  Axillary  ?SpO2: 95% 96% 93% 96%  ?Weight:      ?Height:      ? ?Vent Mode: PRVC ?FiO2 (%):  [48 %-100 %] 100 % ?Set Rate:  [14 bmp-20 bmp] 20 bmp ?Vt Set:  [560 mL] 560 mL ?PEEP:  [5 cmH20] 5 cmH20 ? ?Intake/Output Summary (Last 24 hours) at 10/02/2021 0228 ?Last data filed at 10/01/2021 1500 ?Gross per 24 hour  ?Intake 200 ml  ?Output 850 ml  ?Net -650 ml  ? ? ?General:  In bed on vent, indicating he wants breathing tube out ?HENT: NCAT ETT in place ?PULM: CTA B, vent supported breathing ?CV: RRR, no mgr ?GI: BS+, soft, nontender ?MSK: normal bulk and tone, flail chest noted ?Neuro: awake on vent,  moves all four extremities, follows commands ? ? ? ?CBC ?   ?Component Value Date/Time  ? WBC 7.4 10/02/2021 0051  ? RBC 3.52 (L) 10/02/2021 0051  ? HGB 9.2 (L) 10/02/2021 0143  ? HCT 27.0 (L) 10/02/2021 0143  ? PLT 25 (LL) 10/02/2021 0051  ? MCV 86.9 10/02/2021 0051  ? MCH 29.5 10/02/2021 0051  ? MCHC 34.0 10/02/2021 0051  ? RDW 15.0 10/02/2021 0051  ? LYMPHSABS 2.0 10/02/2021 0051  ? MONOABS 0.4 10/02/2021  0051  ? EOSABS 0.0 10/02/2021 0051  ? BASOSABS 0.0 10/02/2021 0051  ? ? ?BMET ?   ?Component Value Date/Time  ? NA 131 (L) 10/02/2021 0143  ? K 3.3 (L) 10/02/2021 0143  ? CL 99 10/02/2021 0051  ? CO2 18 (L) 10/02/2021 0051  ? GLUCOSE 144 (H) 10/02/2021 0051  ? BUN 111 (H) 10/02/2021 0051  ? CREATININE 3.32 (H) 10/02/2021 0051  ? CALCIUM 9.0 10/02/2021 0051  ? GFRNONAA 18 (L) 10/02/2021 0051  ? GFRAA >60 12/08/2019 0603  ? ? ?CXR images personally reviewed, patchy bilateral airspace disease, widening of mediastinum and posteromedial left diaphragmatic herniation ? ?Impression/Plan: ?Acute respiratory failure with hypoxemia in setting of cardiac arrest: Unclear etiology, has known aspiration pneumonia, continue antibiotics, continue full mechanical ventilatory support for now, pressure support trial in a.m. ?Need for sedation for mechanical ventilation:  Start Precedex, as needed fentanyl per PAD protocol RASS goal 0 to -1 Frequent orientation ?Klebsiella bacteremia in setting of right hydronephrosis and nephrolithiasis status post stenting of ureter by urology: Monitor urine output, continue ceftriaxone as ordered ?Cardiac arrest: Uncertain etiology, question hypoxemia from aspiration pneumonia plus minus medication use (narcotics interfering with respiratory status?)  It is really uncertain what caused his cardiac arrest at this time as he was off the monitor.  He is now fully awake, has a dilated left pupil so will undergo a head CT but I think the anisocoria is related to prior retinal surgery.  See CODE STATUS discussion below. ?Atrial flutter > tele ?Thrombocytopenia > not due to hemolytic or autoimmune process per ID, monitor for bleeding ?AKI with hyperkalemia> due to sepsis, kidney infection, nephrology following, repeat BMET, monitor K, monitor UOP ? ?Goals of care: The patient has multisystem organ failure with a long history of chronic pain, multiple acute medical problems this admission including Klebsiella bacteremia, acute kidney injury, and aspiration pneumonia.  He has been meeting with palliative medicine and has stated that he does not want CPR or mechanical ventilation.  Unfortunately he received both of those in the event of his cardiac arrest this evening.  I have been able to speak to him and communicate with him during which time he nods his head and follows commands.  He is strong, he can make some normal respirations on a spontaneous breathing trial but it somewhat labored and tachypneic and inhibited by flail chest.  I explained to him that I think it is best this evening to try to make him comfortable with sedatives, initiate a work-up with a chest x-ray, telemetry monitoring to try to figure out what caused his cardiac arrest.  I also explained that if I were to remove the breathing tube this minute he may die immediately so after some  thought he said he would be okay with short-term intubation and mechanical ventilation at least overnight.  Then hopefully in the morning palliative medicine can see him and talk to him again and help [place] sort through this situation.  I updated his brother Delfino Lovett by phone to said he wanted whatever Jevin wants.  ? ?Remains limited code for now, will address code status again in AM after brother arrives and palliative medicine can see him again.  ? ?My cc time 34 minutes ? ?Roselie Awkward, MD ?[facility] ?Pager: [phone] ?Cell: [phone] ?After 7pm: [phone] ? ?

## 2021-10-02 NOTE — Procedures (Signed)
Extubation Procedure Note ? ?Patient Details:   ?Name: Aaron Klein ?DOB: 18-Jan-1946 ?MRN: [id number] ?  ?Airway Documentation:  ?  ?Vent end date: 10/02/21 Vent end time: 1427  ? ?Evaluation ? O2 sats: stable throughout ?Complications: No apparent complications ?Patient did tolerate procedure well. ?Bilateral Breath Sounds: Clear, Diminished ?  ?Yes ? ?Esperanza Sheets T ?10/02/2021, 12:49 PM ? ?

## 2021-10-02 NOTE — Progress Notes (Addendum)
RRT placed a NRB 15L overtop of pt's [place]. HF settings increased to 45L and 100%. SATs increased to 98% but WOB is still significantly increased and pt is in respiratory distress. RN made aware and will increase fentanyl drip from 50-75. RRT will continue to monitor and assist with helping pt with comfort.  ?

## 2021-10-02 NOTE — Progress Notes (Signed)
Patient continues to be in discomfort and have increased work of breathing.   Respiratory therapist at bedside and added a non-rebreather to assist in patient's oxygen saturations due to patient breathing through his mouth.  Fentanyl drip increased for patient comfort.   ?

## 2021-10-02 NOTE — Progress Notes (Signed)
Patient experiencing increased work of breathing and patient endorses discomfort in ribs affecting breathing.  Patient's oxygen saturation levels ranging from 82-90%, patient on heated high flow nasal cannula but is breathing in and out of his mouth.  Patient states he is a mouth breather and is unable to consistently breath in through his nose.   Fentanyl drip started for patient comfort.   Respiratory therapist called to assess patient's oxygen delivery route due to patient breathing through his mouth vs his nose.  ?

## 2021-10-02 NOTE — Consult Note (Signed)
Attending: ?  ? ?Subjective: ?76 y/o male with multiple medical problems admitted on 4/5 with generalized weakness, noted to have sepsis physiology, elevated lactic acid.  Blood culture was noted to be growing Klebsiella.  He was found to have an obstructive right hydronephrosis from right nephrolithiasis.  He underwent stent placement by urology on 4/6.  Since then the patient has been followed by urology, hematology for pancytopenia, palliative medicine.  Palliative medicine has been working with him and his CODE STATUS is listed as a partial code: No intubation or CPR in the event of a cardiac arrest but medications would be okay, BiPAP okay, cardiac shocks okay. ? ?Early in the morning on 4/10 he was noted to be unresponsive, his leads were off.  CPR was initiated, epinephrine given, he received about 10 minutes of CPR.  He was intubated and transferred to the intensive care unit.  Not long after transfer he was noted to be fully awake demanding to have his breathing tube removed.  Pulmonary and critical care medicine was consulted. ? ?On my evaluation we switched him to pressure support ventilation to see if he could be extubated but he was noted to have flail chest and was somewhat tachypneic.  I discussed options with him.  I explained that we could extubate but there is a good chance that he would develop difficulty breathing considering what I was seeing.  I offered sedatives in the form of Precedex and as needed fentanyl with a brief work-up to try to get to the bottom of what is going on.  He said he would prefer this option. ? ?Past Medical History:  ?Diagnosis Date  ? Arthritis   ? 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 ED (erectile dysfunction)   ? GERD (gastroesophageal reflux disease)   ? History of hiatal hernia   ? Hyperlipidemia   ? Irregular heart beat   ? Scoliosis   ? ? ? ?Objective: ?Vitals:  ? 10/01/21 1448 10/01/21 2053 10/01/21 2100 10/02/21 0106  ?BP:  (!) 119/99    ?Pulse:  86  (!) 120  ?Resp:  20   20  ?Temp:  98 ?F (36.7 ?C)  (!) 97.5 ?F (36.4 ?C)  ?TempSrc:  Oral  Axillary  ?SpO2: 95% 96% 93% 96%  ?Weight:      ?Height:      ? ?Vent Mode: PRVC ?FiO2 (%):  [48 %-100 %] 100 % ?Set Rate:  [14 bmp-20 bmp] 20 bmp ?Vt Set:  [560 mL] 560 mL ?PEEP:  [5 cmH20] 5 cmH20 ? ?Intake/Output Summary (Last 24 hours) at 10/02/2021 0228 ?Last data filed at 10/01/2021 1500 ?Gross per 24 hour  ?Intake 200 ml  ?Output 850 ml  ?Net -650 ml  ? ? ?General:  In bed on vent, indicating he wants breathing tube out ?HENT: NCAT ETT in place ?PULM: CTA B, vent supported breathing ?CV: RRR, no mgr ?GI: BS+, soft, nontender ?MSK: normal bulk and tone, flail chest noted ?Neuro: awake on vent,  moves all four extremities, follows commands ? ? ? ?CBC ?   ?Component Value Date/Time  ? WBC 7.4 10/02/2021 0051  ? RBC 3.52 (L) 10/02/2021 0051  ? HGB 9.2 (L) 10/02/2021 0143  ? HCT 27.0 (L) 10/02/2021 0143  ? PLT 25 (LL) 10/02/2021 0051  ? MCV 86.9 10/02/2021 0051  ? MCH 29.5 10/02/2021 0051  ? MCHC 34.0 10/02/2021 0051  ? RDW 15.0 10/02/2021 0051  ? LYMPHSABS 2.0 10/02/2021 0051  ? MONOABS 0.4 10/02/2021  0051  ? EOSABS 0.0 10/02/2021 0051  ? BASOSABS 0.0 10/02/2021 0051  ? ? ?BMET ?   ?Component Value Date/Time  ? NA 131 (L) 10/02/2021 0143  ? K 3.3 (L) 10/02/2021 0143  ? CL 99 10/02/2021 0051  ? CO2 18 (L) 10/02/2021 0051  ? GLUCOSE 144 (H) 10/02/2021 0051  ? BUN 111 (H) 10/02/2021 0051  ? CREATININE 3.32 (H) 10/02/2021 0051  ? CALCIUM 9.0 10/02/2021 0051  ? GFRNONAA 18 (L) 10/02/2021 0051  ? GFRAA >60 12/08/2019 0603  ? ? ?CXR images personally reviewed, patchy bilateral airspace disease, widening of mediastinum and posteromedial left diaphragmatic herniation ? ?Impression/Plan: ?Acute respiratory failure with hypoxemia in setting of cardiac arrest: Unclear etiology, has known aspiration pneumonia, continue antibiotics, continue full mechanical ventilatory support for now, pressure support trial in a.m. ?Need for sedation for mechanical ventilation:  Start Precedex, as needed fentanyl per PAD protocol RASS goal 0 to -1 Frequent orientation ?Klebsiella bacteremia in setting of right hydronephrosis and nephrolithiasis status post stenting of ureter by urology: Monitor urine output, continue ceftriaxone as ordered ?Cardiac arrest: Uncertain etiology, question hypoxemia from aspiration pneumonia plus minus medication use (narcotics interfering with respiratory status?)  It is really uncertain what caused his cardiac arrest at this time as he was off the monitor.  He is now fully awake, has a dilated left pupil so will undergo a head CT but I think the anisocoria is related to prior retinal surgery.  See CODE STATUS discussion below. ?Atrial flutter > tele ?Thrombocytopenia > not due to hemolytic or autoimmune process per ID, monitor for bleeding ?AKI with hyperkalemia> due to sepsis, kidney infection, nephrology following, repeat BMET, monitor K, monitor UOP ? ?Goals of care: The patient has multisystem organ failure with a long history of chronic pain, multiple acute medical problems this admission including Klebsiella bacteremia, acute kidney injury, and aspiration pneumonia.  He has been meeting with palliative medicine and has stated that he does not want CPR or mechanical ventilation.  Unfortunately he received both of those in the event of his cardiac arrest this evening.  I have been able to speak to him and communicate with him during which time he nods his head and follows commands.  He is strong, he can make some normal respirations on a spontaneous breathing trial but it somewhat labored and tachypneic and inhibited by flail chest.  I explained to him that I think it is best this evening to try to make him comfortable with sedatives, initiate a work-up with a chest x-ray, telemetry monitoring to try to figure out what caused his cardiac arrest.  I also explained that if I were to remove the breathing tube this minute he may die immediately so after some  thought he said he would be okay with short-term intubation and mechanical ventilation at least overnight.  Then hopefully in the morning palliative medicine can see him and talk to him again and help [place] sort through this situation.  I updated his brother Delfino Lovett by phone to said he wanted whatever Amadi wants.  ? ?Remains limited code for now, will address code status again in AM after brother arrives and palliative medicine can see him again.  ? ?My cc time 34 minutes ? ?Roselie Awkward, MD ?[facility] ?Pager: [phone] ?Cell: [phone] ?After 7pm: [phone] ? ?

## 2021-10-02 NOTE — Progress Notes (Signed)
Nutrition Brief Note ? ?Patient screened for new vent. A goals of care meeting was held with family this AM. Patient was compassionately/terminally extubated a short time ago. He remains NPO since that time. MD note from 1148 indicates plan for comfort care following extubation.  ? ?No further nutrition interventions planned at this time. Please re-consult as needed.  ? ? ? ? ?Jarome Matin, MS, RD, LDN ?Registered Dietitian II ?Inpatient Clinical Nutrition ?RD pager # and on-call/weekend pager # available in [place]  ? ? ?

## 2021-10-02 NOTE — Progress Notes (Signed)
eLink Physician-Brief Progress Note ?Patient Name: Aaron Klein ?DOB: 05/14/1946 ?MRN: [id number] ? ? ?Date of Service ? 10/02/2021  ?HPI/Events of Note ? 75/M with glottic cancer, in remission, who had been admitted on 4/5 with sepsis secondary to apiration pneumonia and complicated UTI as well as AKI. Overnight, a code blue was called as pt was found to be pulseless. CODE BLUE was called and chest compressions immediately started. ROSC was obtained after 5 minutes, during which time pt had received 1 dose of epinephrine. He was intubated for airway protection following code.  ?  ?eICU Interventions ? Plan ?1. S/p cardiac arrest  ?- Etiology of sudden arrest was unclear.  ?   - Maintain on continuous cardiac monitoring.  ?   - Serial EKG, will check troponins.  ?   - No hypoglycemia reported.  ?   - Recheck BMP, will correct electrolyte abnormalities, esp. K ?- Serial neurochecks as per protocol. Plan for head CT ?- Would aim to address fever agressively, if any were to arise.  ?- Maintain vent support as below.  ?- Pt not requiring pressor support at this time.  ?- Monitor I/Os, daily weights ? ?2. Acute respiratory failure ?- Maintain on TV 4-52m/kg PBW, target plateau pressures <30 ?- Titrate FiO2 and PEEP to target SpO2 <90% ?- Serial ABG, will make further vent adjustments as warranted.  ?- Plan on daily sedation vacation to facilitate evaluation of neurologic status ?- Follow up post intubation CXR ? ?3. Sepsis secondary to UTI, aspiration pneumonia ?- Continue antibiotic therapy ?- Source control with ureteral stent placement performed 4/6 ?- Trend WBC, lactate, temperature curve ? ?4. AKI ?- Monitor I/Os, daily weights ?- Follow BMP, would address electrolyte abnormalities as appropriate.  ?- Renally dose all medications ?- Avoid nephrotoxins ? ?5. Prognosis guarded. Would need to readdress code status and goals of care.   ? ? ? ?  ? ?[place]?10/02/2021, 1:48 AM ?

## 2021-10-02 NOTE — Plan of Care (Signed)
?  Interdisciplinary Goals of Care Family Meeting ? ? ?Date carried out:: 10/02/2021 ? ?Location of the meeting: Bedside ? ?Member's involved: Physician, Bedside Registered Nurse, and Family Member or next of kin ? ?Durable Power of Tour manager: son   ? ?Discussion: We discussed goals of care for Aaron Klein .  Family has spoken to Mr. Lashley who wishes to be terminally extubated. The family are in agreement that additional resuscitation is against his wishes. Full DNR. Post-extubation we will do our best to support him, but family understands we are unlikely to be successful and he has high risk of death. Extubation orders in place, have fentanyl gtt and versed PRN in the event he has air hunger or discomfort, for which is there is a high chance. ? ?Code status: Full DNR ? ?Disposition: In-patient comfort care ? ? ?Time spent for the meeting: 30 min. ? ?Julian Hy ?10/02/2021, 11:48 AM ? ?

## 2021-10-02 NOTE — Progress Notes (Signed)
?Aaron Klein ?Progress Note  ? ? ?Assessment/ Plan:   ? AKI: last known Cr 1.01 2021 in [place].  Presenting with AKI in the setting of obstructing stone and sepsis.  Borderline oliguric.  Given overall clinical situation care is mainly supportive.  Trying to avoid use of dialysis if at all possible ?- keep Foley ?- urology following s/p stone removal ?-Pressors and midodrine as needed  for blood pressure support ?- avoid nephrotoxins, IV contrast as able, fleets enemas, mag citrate ?-Continue to monitor creatinine trend and urine output ?  ?2.  Severe sepsis 2/2 Klebsiella bacteremia ?-Antibiotics per primary team ?  ?3.  Hyperkalemia: Has improved with Lokelma ?  ?4.  Hyponatremia: ?- likely d/t impaired free water excretion with AKI ?-Minimal decrease at this time ?  ?5.  Thrombocytopenia: ?- likely d/t sepsis, no hemolytic process identified, heme consulted ?  ?6.  Acute hypoxic RF: ?- aspiration component, O2 requirement increasing after IVFs ?-Antibiotics per primary team ?-Ventilatory management per pulmonology ? ?7.  Cardiac arrest: Unclear cause.  Management per primary ?  ?8.  Dispo: inpt.  He has MSOF and I agree with palliative care c/s.  Greatly appreciate their assistance ? ?Subjective:   ? ?Unfortunately patient had PEA arrest last night and received cardiac resuscitation.  Patient is currently ventilated and team is working up because of arrest.  Patient is on ventilator this morning. ? ?Creatinine fairly stable today at 3.2 with high BUN.  Patient without obvious signs of uremia but difficult to ascertain current situation  ? ?Objective:   ?BP 115/79   Pulse (!) 114   Temp 97.8 ?F (36.6 ?C) (Axillary)   Resp (!) 21   Ht '5\' 9"'  (1.753 m)   Wt 86.4 kg   SpO2 95%   BMI 28.13 kg/m?  ? ?Intake/Output Summary (Last 24 hours) at 10/02/2021 0906 ?Last data filed at 10/02/2021 0800 ?Gross per 24 hour  ?Intake 400.76 ml  ?Output 500 ml  ?Net -99.24 ml  ? ?Weight change:  ? ?Physical  Exam: ?Gen: Lying in bed, ventilated, awake and alert ?CVS: tachycardic, no audible murmur ?Resp: Coarse breath sounds bilaterally, ventilated, no accessory muscle use ?Abd: soft, nondistended ?GU: Foley in place ?Ext: no LE edema, warm and well-perfused ? ?Imaging: ?CT HEAD WO CONTRAST (5MM) ? ?Result Date: 10/02/2021 ?CLINICAL DATA:  Anisocoria following cardiac arrest, initial encounter EXAM: CT HEAD WITHOUT CONTRAST TECHNIQUE: Contiguous axial images were obtained from the base of the skull through the vertex without intravenous contrast. RADIATION DOSE REDUCTION: This exam was performed according to the departmental dose-optimization program which includes automated exposure control, adjustment of the mA and/or kV according to patient size and/or use of iterative reconstruction technique. COMPARISON:  None. FINDINGS: Brain: No evidence of acute infarction, hemorrhage, hydrocephalus, extra-axial collection or mass lesion/mass effect. Mild atrophic changes are noted. Vascular: No hyperdense vessel or unexpected calcification. Skull: Normal. Negative for fracture or focal lesion. Sinuses/Orbits: No acute finding. Other: None. IMPRESSION: Mild atrophic changes.  No acute abnormality noted. Electronically Signed   By: Inez Catalina M.D.   On: 10/02/2021 02:21  ? ?DG Pelvis Portable ? ?Result Date: 09/30/2021 ?CLINICAL DATA:  Low back pain EXAM: PORTABLE PELVIS 1-2 VIEWS COMPARISON:  Abdominal CT from 2 years ago FINDINGS: There is no evidence of pelvic fracture or diastasis. No pelvic bone lesions are seen. Both hips are located and show no significant degenerative change. Partially covered lumbar scoliosis and degeneration. Enteric contrast and right ureteral stone. IMPRESSION:  Negative pelvis and hips. Electronically Signed   By: Jorje Guild M.D.   On: 09/30/2021 09:32  ? ?DG Chest Port 1 View ? ?Result Date: 10/02/2021 ?CLINICAL DATA:  Status post endotracheal tube placement. EXAM: PORTABLE CHEST 1 VIEW  COMPARISON:  September 29, 2021 FINDINGS: An endotracheal tube is seen with its distal tip approximately 5.1 cm from the carina. Decreased lung volumes are seen with elevation of the right hemidiaphragm. Persistent bilateral patchy opacities are again noted. This is mildly increased in severity when compared to the prior study. A small right pleural effusion is noted. No pneumothorax is identified. Gastric herniation within the retrocardiac region of the left lung base is again seen. The cardiac silhouette is mildly enlarged and unchanged in size. Stable widening of the superior mediastinum is noted. The visualized skeletal structures are unremarkable. IMPRESSION: 1. Endotracheal tube positioning, as described above. 2. Patchy bilateral opacities, concerning for multifocal infiltrates, mildly increased in severity when compared to the prior study. 3. Persistent widening of the superior mediastinum. Correlation with chest CT is recommended to exclude the presence of a mediastinal mass. 4. 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 5. Small right pleural effusion. Electronically Signed   By: Virgina Norfolk M.D.   On: 10/02/2021 02:16   ? ?Labs: ?BMET ?Recent Labs  ?Lab 10/13/2021 ?1008 10/21/2021 ?2350 09/29/21 ?0826 09/29/21 ?1633 09/29/21 ?2017 09/30/21 ?0415 10/01/21 ?9702 10/02/21 ?6378 10/02/21 ?0143 10/02/21 ?5885 10/02/21 ?0277  ?NA 131* 131* 132*  --   --  133* 134* 134* 131* 135 134*  ?K 5.5* 6.0* 5.6*   < > 5.4* 5.3* 4.2 3.9 3.3* 3.9 4.0  ?CL 98 100 102  --   --  101 102 99  --  102  --   ?CO2 18* 18* 16*  --   --  19* 19* 18*  --  19*  --   ?GLUCOSE 65* 59* 77  --   --  79 89 144*  --  121*  --   ?BUN 63* 74* 81*  --   --  98* 105* 111*  --  115*  --   ?CREATININE 4.08* 3.95* 3.86*  --   --  3.83* 3.41* 3.32*  --  3.23*  --   ?CALCIUM 9.3 8.9 9.0  --   --  9.0 8.9 9.0  --  8.8*  --   ? < > = values in this interval not displayed.  ? ?CBC ?Recent Labs  ?Lab 10/09/2021 ?2350  09/29/21 ?0826 09/30/21 ?0415 10/01/21 ?4128 10/02/21 ?7867 10/02/21 ?0143 10/02/21 ?6720 10/02/21 ?9470  ?WBC 5.5   < > 4.7 3.8* 7.4  --  5.0  --   ?NEUTROABS 5.1  --  3.7 2.9 4.1  --   --   --   ?HGB 11.5*   < > 10.5* 10.1* 10.4* 9.2* 8.7* 9.5*  ?HCT 32.7*   < > 30.2* 29.4* 30.6* 27.0* 25.8* 28.0*  ?MCV 84.3   < > 85.6 85.5 86.9  --  85.7  --   ?PLT 26*   < > 20* 20* 25*  --  20*  --   ? < > = values in this interval not displayed.  ? ? ?Medications:   ? ? acetaminophen  1,000 mg Oral TID  ? chlorhexidine gluconate (MEDLINE KIT)  15 mL Mouth Rinse BID  ? Chlorhexidine Gluconate Cloth  6 each Topical Daily  ? diclofenac Sodium  4 g Topical TID PC & HS  ?  docusate  100 mg Per Tube BID  ? feeding supplement (NEPRO CARB STEADY)  237 mL Oral TID WC  ? finasteride  5 mg Oral Daily  ? ipratropium  0.5 mg Nebulization TID  ? lidocaine  1 patch Transdermal Q24H  ? mouth rinse  15 mL Mouth Rinse 10 times per day  ? metroNIDAZOLE  500 mg Oral BID  ? midodrine  10 mg Oral TID WC  ? pantoprazole  40 mg Oral Daily  ? polyethylene glycol  17 g Per Tube Daily  ? sodium bicarbonate  1,300 mg Oral BID  ? sodium zirconium cyclosilicate  10 g Oral Daily  ? tamsulosin  0.4 mg Oral Daily  ? ? ? ? ?Reesa Chew ? ?10/02/2021, 9:06 AM   ?

## 2021-10-02 NOTE — Procedures (Signed)
Intubation Procedure Note ? ?TATUM MASSMAN  ?[id number]  ?05/20/1946 ? ?Date:10/02/21  ?Time:1:06 AM  ? ?Provider Performing:Sameka Bagent, Teena Irani  ? ? ?Procedure: Intubation (85885) ? ?Indication(s) ?Respiratory Failure ? ?Consent ?Unable to obtain consent due to emergent nature of procedure. ? ? ?Anesthesia ? ? ? ?Time Out ?Verified patient identification, verified procedure, site/side was marked, verified correct patient position, special equipment/implants available, medications/allergies/relevant history reviewed, required imaging and test results available. ? ? ?Sterile Technique ?Usual hand hygeine, masks, and gloves were used ? ? ?Procedure Description ?Patient positioned in bed supine.  Sedation given as noted above.  Patient was intubated with endotracheal tube using  Miller 3 laryngoscope .  View was Grade 1 full glottis .  Number of attempts was 1.  Colorimetric CO2 detector was consistent with tracheal placement. ? ? ?Complications/Tolerance ?None; patient tolerated the procedure well. ?Chest X-ray is ordered to verify placement. ? ? ?EBL ?Minimal ? ? ?Specimen(s) ?None ? ?

## 2021-10-02 NOTE — Progress Notes (Signed)
[facility] ?PROGRESS NOTE ? ?Patient: Aaron Klein TNB:[id number]   ?PCP: Aletha Halim., PA-C DOB: 09-28-1945   ?DOA: 10/21/2021   DOS: 10/02/2021   ? ?Events from last night noted. Pt currently intubated on vent after brief CPR for PEA arrest. Highly appreciate ICU team's assistance with pt's care.  ?TR[facility] currently will be signing off. Please call us back once the pt is ready to come out of ICU. Thank you. ? ?Author: ?Berle Mull, MD ?[facility] ?10/02/2021 8:04 AM   ?If 7PM-7AM, please contact night-coverage at [email or web address]  ?

## 2021-10-03 LAB — CALCIUM, IONIZED: Calcium, Ionized, Serum: 4.9 mg/dL (ref 4.5–5.6)

## 2021-10-23 MED FILL — Medication: Qty: 1 | Status: AC

## 2021-10-23 NOTE — Death Summary Note (Signed)
?DEATH SUMMARY  ? ?Patient Details  ?Name: Aaron Klein ?MRN: [id number] ?DOB: 1946-03-20 ? ?Admission/Discharge Information  ? ?Admit Date:  10/26/21  ?Date of Death: Date of Death: 11-01-2021  ?Time of Death: Time of Death: 30  ?Length of Stay: 5  ?Referring Physician: Aletha Halim., PA-C  ? ?Reason(s) for Hospitalization  ?Sepsis due to UTI and bacteremia ? ?Diagnoses  ?Preliminary cause of death:  ?Secondary Diagnoses (including complications and co-morbidities):  ?Principal Problem: ?  Severe sepsis ([place]) ?Active Problems: ?  Malignant neoplasm of glottis ([place]) ?  AKI (acute kidney injury) ([place]) ?  Thrombocytopenia ([place]) ?  Elevated LFTs ?  Aspiration pneumonia ([place]) ?  Typical atrial flutter ([place]) ?  Bacteremia due to Klebsiella pneumoniae ?  Acute respiratory failure with hypoxia ([place]) ?  Hyperkalemia ?  Hyponatremia ?  Low back pain ?  Metabolic acidosis ?  Goals of care, counseling/discussion ?  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  Right kidney stone ?  Hydronephrosis of right kidney ?  Cardiac arrest, cause unspecified ([place]) ?Severe sepsis ? ? ?Brief Hospital Course (including significant findings, care, treatment, and services provided and events leading to death)  ?Aaron Klein is a 76 y.o. year old male w/ pertinent PMH of laryngeal cancer in remission, GERD, chronic low back pain, HLD, present in [facility] ED on Oct 27, 2022 w/ weakness. ?  ?Patient weakness noticed for a few days prior to admission. Went to [facility] med center 2022/10/27. Noted to have fever 99 F. Tachycardic. Soft BP. CXR showing possible pneumonia. Covid/flu negative. Labs showing AKI, hyponatremia, hyperkalemia, AG acidosis, hypercalcemia, elevated LFTs, elevated bnp, severe thrombocytopenia. Started on abx for possible pneumonia. LA intiially 6.7 which improved with IV fluids. ?  ?Further workup showed patient to have sepsis due to Klebsiella bacteremia likely from UTI. Patient also developed hypoxic respiratory failure  requiring 9L [place] from aspiration pneumonia. EKG w/ a flutter. Palliative care following and was made partial code: dni/no compressions. ?  ?On Nov 01, 2022, Nurse went to check on patient because lead's were off. Found patient unresponsive and pulseless. Code blue called at [place]. CPR started and received 1 epi. Rosc 1240. Post- arrest rhythm appeared to be afib with rate 130s. Sbp 140s. Transported to [facility] ICU. He and his family elected to prioritize extubation, and he quickly developed respiratory distress and was transitioned to comfort care. He passed away on 11/01/2022. ? ? ?Pertinent Labs and Studies  ?Significant Diagnostic Studies ?CT HEAD WO CONTRAST (5MM) ? ?Result Date: Oct 31, 2021 ?CLINICAL DATA:  Anisocoria following cardiac arrest, initial encounter EXAM: CT HEAD WITHOUT CONTRAST TECHNIQUE: Contiguous axial images were obtained from the base of the skull through the vertex without intravenous contrast. RADIATION DOSE REDUCTION: This exam was performed according to the departmental dose-optimization program which includes automated exposure control, adjustment of the mA and/or kV according to patient size and/or use of iterative reconstruction technique. COMPARISON:  None. FINDINGS: Brain: No evidence of acute infarction, hemorrhage, hydrocephalus, extra-axial collection or mass lesion/mass effect. Mild atrophic changes are noted. Vascular: No hyperdense vessel or unexpected calcification. Skull: Normal. Negative for fracture or focal lesion. Sinuses/Orbits: No acute finding. Other: None. IMPRESSION: Mild atrophic changes.  No acute abnormality noted. Electronically Signed   By: Inez Catalina M.D.   On: 10/31/21 02:21  ? ?DG Pelvis Portable ? ?Result Date: 09/30/2021 ?CLINICAL DATA:  Low back pain EXAM: PORTABLE PELVIS 1-2 VIEWS COMPARISON:  Abdominal CT from 2 years ago FINDINGS: There is no  evidence of pelvic fracture or diastasis. No pelvic bone lesions are seen. Both hips are located and show no significant degenerative  change. Partially covered lumbar scoliosis and degeneration. Enteric contrast and right ureteral stone. IMPRESSION: Negative pelvis and hips. Electronically Signed   By: Jorje Guild M.D.   On: 09/30/2021 09:32  ? ?DG Chest Port 1 View ? ?Result Date: 10/02/2021 ?CLINICAL DATA:  Status post endotracheal tube placement. EXAM: PORTABLE CHEST 1 VIEW COMPARISON:  September 29, 2021 FINDINGS: An endotracheal tube is seen with its distal tip approximately 5.1 cm from the carina. Decreased lung volumes are seen with elevation of the right hemidiaphragm. Persistent bilateral patchy opacities are again noted. This is mildly increased in severity when compared to the prior study. A small right pleural effusion is noted. No pneumothorax is identified. Gastric herniation within the retrocardiac region of the left lung base is again seen. The cardiac silhouette is mildly enlarged and unchanged in size. Stable widening of the superior mediastinum is noted. The visualized skeletal structures are unremarkable. IMPRESSION: 1. Endotracheal tube positioning, as described above. 2. Patchy bilateral opacities, concerning for multifocal infiltrates, mildly increased in severity when compared to the prior study. 3. Persistent widening of the superior mediastinum. Correlation with chest CT is recommended to exclude the presence of a mediastinal mass. 4. 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 5. Small right pleural effusion. Electronically Signed   By: Virgina Norfolk M.D.   On: 10/02/2021 02:16  ? ?DG CHEST PORT 1 VIEW ? ?Result Date: 09/29/2021 ?CLINICAL DATA:  76 year old male with a history of shortness of breath EXAM: PORTABLE CHEST 1 VIEW COMPARISON:  10/06/2021 FINDINGS: Cardiomediastinal silhouette unchanged. Double density projecting over the lower mediastinum is unchanged. Low lung volumes persist. Increased patchy opacities in the right upper lung right lower lung, left mid lung overlying  the left heart border. No pneumothorax. No large pleural effusion. IMPRESSION: Increasing patchy airspace opacities, concerning for multifocal pneumonia. Followup PA and lateral chest X-ray is recommended in 3-4 weeks following trial of therapy to assure resolution. Hiatal hernia Electronically Signed   By: Corrie Mckusick D.O.   On: 09/29/2021 12:18  ? ?DG Chest Portable 1 View ? ?Result Date: 10/17/2021 ?CLINICAL DATA:  Weakness.  Fever yesterday. EXAM: PORTABLE CHEST 1 VIEW COMPARISON:  Radiograph 12/06/2019 FINDINGS: Lung volumes are low limiting assessment. Ill-defined and streaky opacity in the right upper lung zone. Heart size grossly stable. There is a large hiatal hernia that is suboptimally assessed on this portable exam. No pleural effusion, pneumothorax, or pulmonary edema. Scoliosis. IMPRESSION: 1. Low lung volumes with streaky opacity in the right upper lung zone, suspicious for pneumonia. 2. Large hiatal hernia, suboptimally assessed on this portable exam. Electronically Signed   By: Keith Rake M.D.   On: 10/07/2021 18:25  ? ?DG C-Arm 1-60 Min-No Report ? ?Result Date: 10/17/2021 ?Fluoroscopy was utilized by the requesting physician.  No radiographic interpretation.  ? ?ECHOCARDIOGRAM COMPLETE ? ?Result Date: 09/29/2021 ?   ECHOCARDIOGRAM REPORT   Patient Name:   YUEPHENG SCHALLER [place] Date of Exam: 09/29/2021 Medical Rec #:  [id number]      Height:       69.0 in Accession #:    [id number]     Weight:       173.2 lb Date of Birth:  Nov 26, 1945     BSA:          1.943 m? Patient Age:    9 years  BP:           104/84 mmHg Patient Gender: M              HR:           102 bpm. Exam Location:  Inpatient Procedure: 2D Echo, Cardiac Doppler, Color Doppler and Intracardiac            Opacification Agent Indications:    R06.02 SOB  History:        Patient has no prior history of Echocardiogram examinations.                 Abnormal ECG, Arrythmias:Atrial Fibrillation,                 Signs/Symptoms:Bacteremia; Risk  Factors:Hypertension and Sleep                 Apnea. Cancer.  Sonographer:    Roseanna Rainbow RDCS Referring Phys: [id number] [place]  Sonographer Comments: Technically difficult study due to poor echo w

## 2021-10-23 DEATH — deceased

## 2022-01-19 ENCOUNTER — Ambulatory Visit: Payer: Self-pay | Admitting: Radiation Oncology

## 2023-06-09 IMAGING — CT CT HEAD W/O CM
4 series · 16 of 47 positions shown, 18 images · non-contrast
Comparison: None.

CLINICAL DATA: Anisocoria following cardiac arrest, initial
encounter



[Series 3: head without · axial · non-contrast · 0.43mm/px · z∈[-120,-5]mm · 7 of 31 slices shown, 9 images]
[im 4/31  brain]
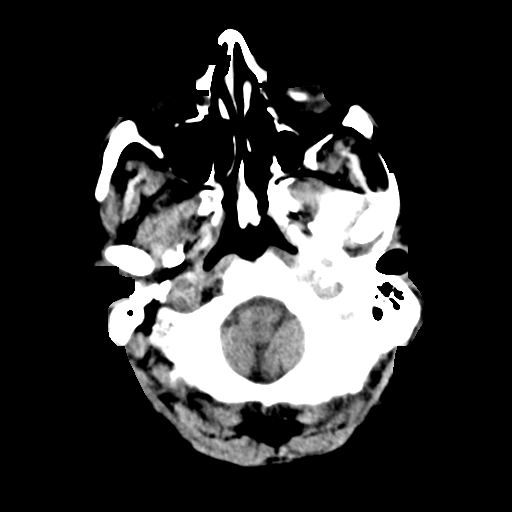
[im 4/31  bone]
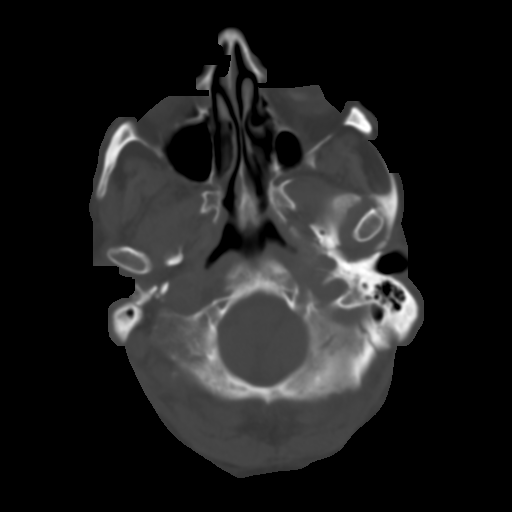
[im 8/31  brain]
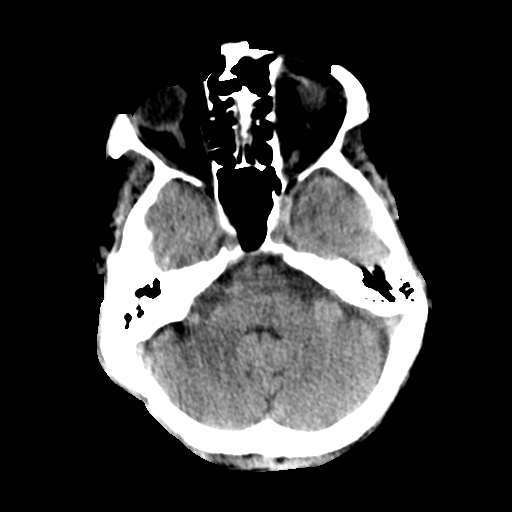
[im 12/31  brain]
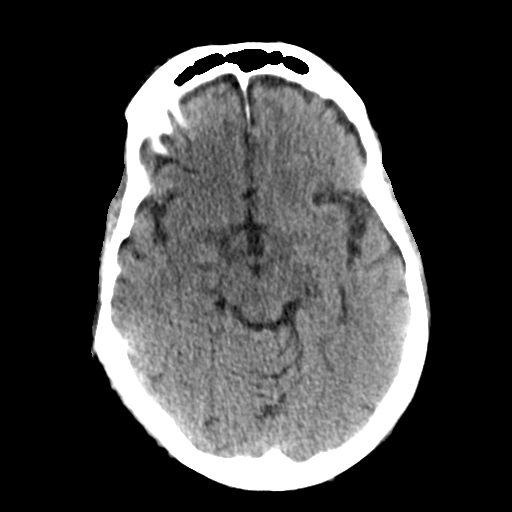
[im 16/31  brain]
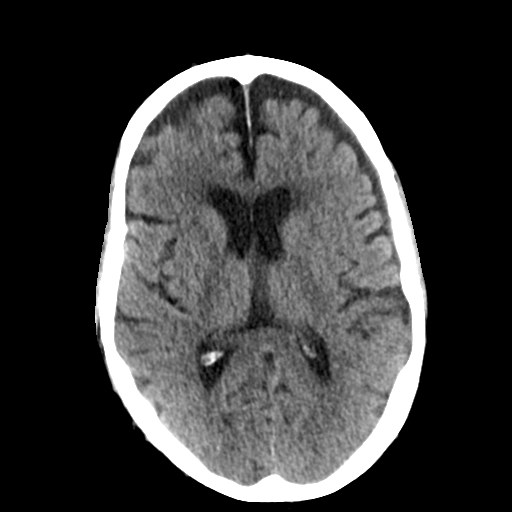
[im 19/31  brain]
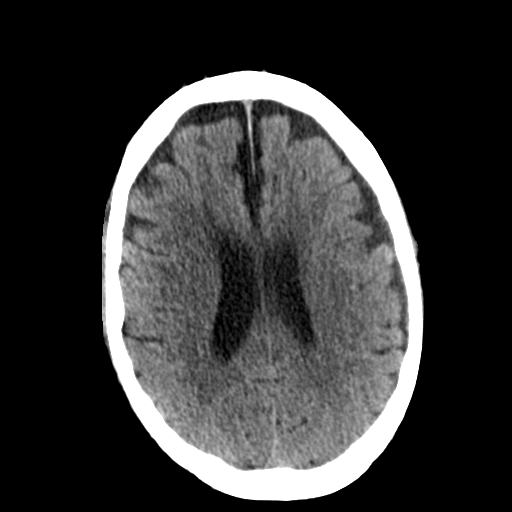
[im 19/31  bone]
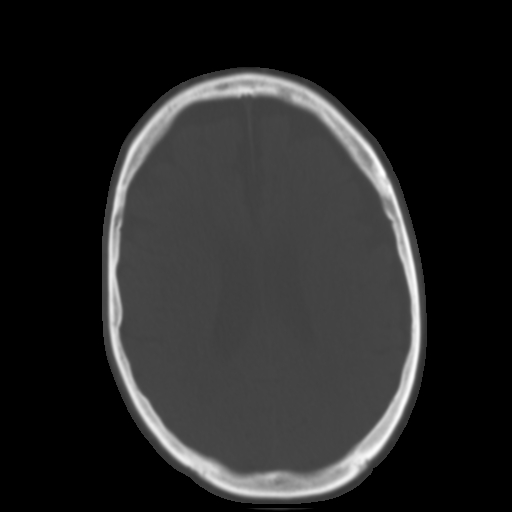
[im 23/31  brain]
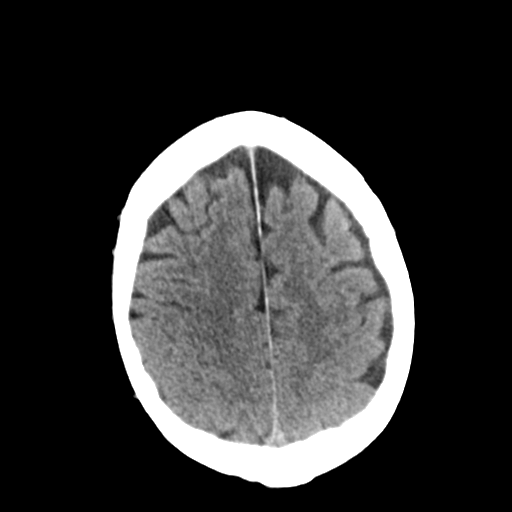
[im 27/31  brain]
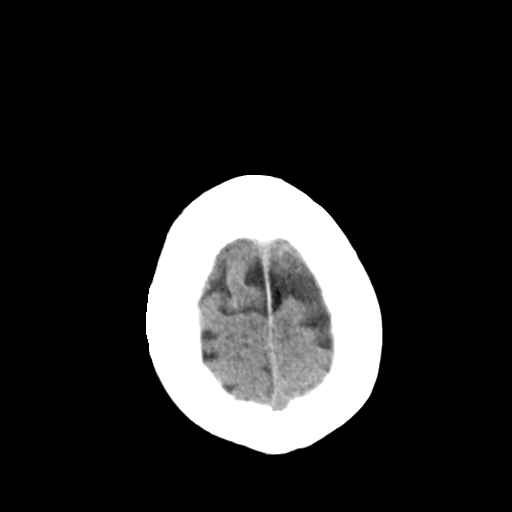

[Series 4: head bone · axial · 0.43mm/px · z∈[-121,-89]mm · 3 of 78 slices shown]
[im 8/78  bone]
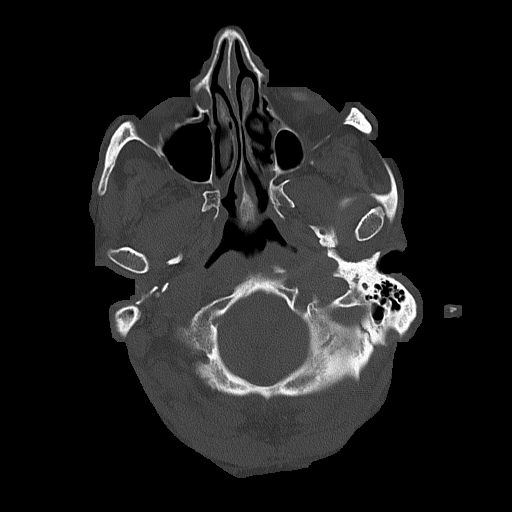
[im 16/78  bone]
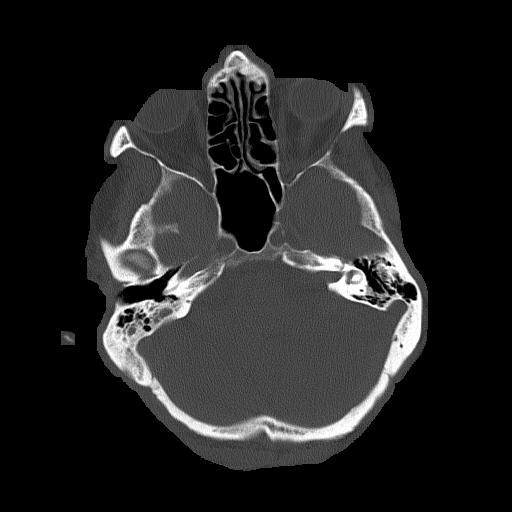
[im 24/78  bone]
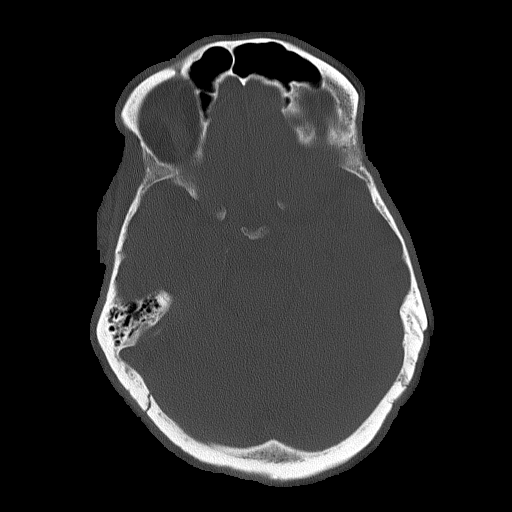

[Series 5: head without cor · coronal · non-contrast · 0.34mm/px · 3 of 71 slices shown]
[im 25/71  brain]
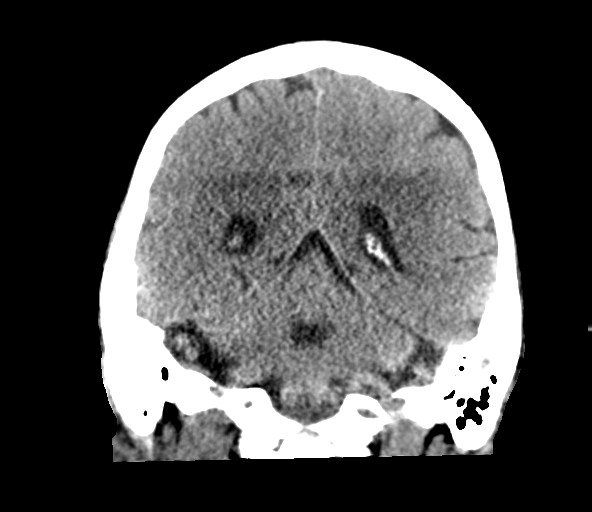
[im 32/71  brain]
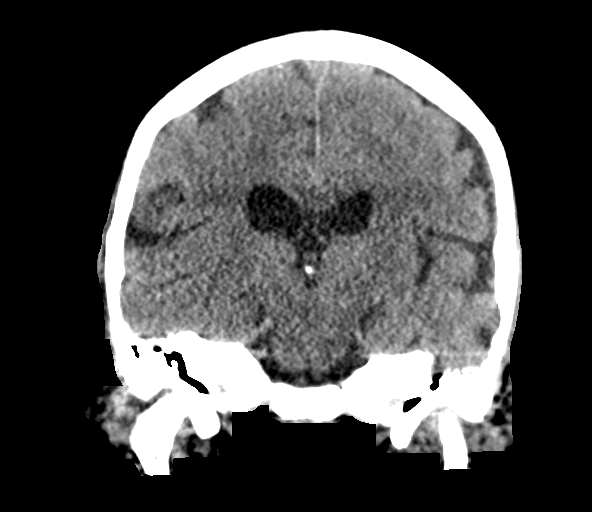
[im 39/71  brain]
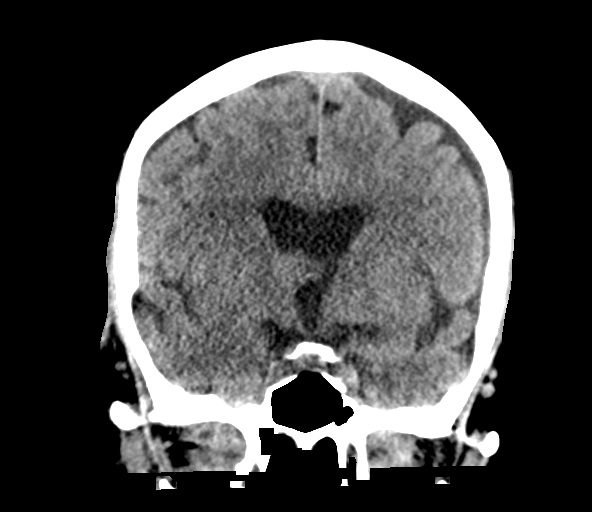

[Series 6: head without sag · sagittal · non-contrast · 0.35mm/px · 3 of 63 slices shown]
[im 21/63  brain]
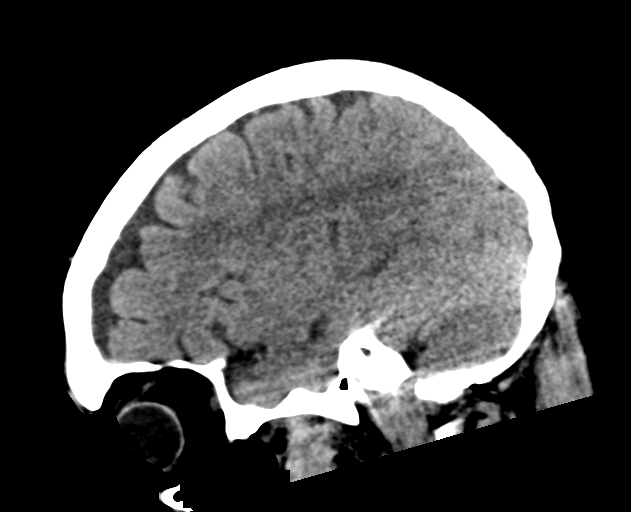
[im 32/63  brain]
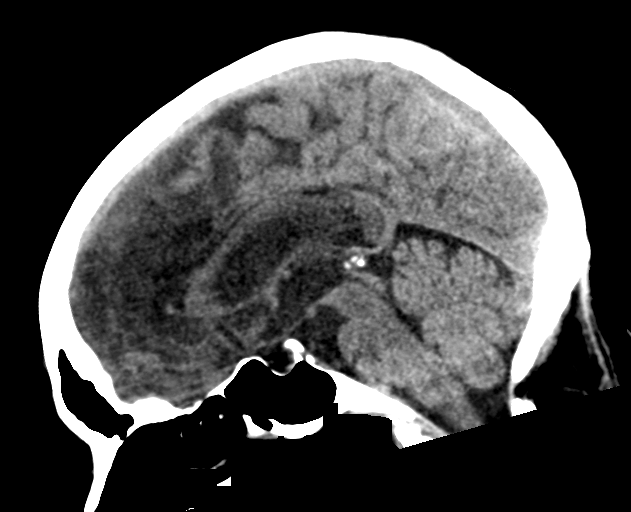
[im 42/63  brain]
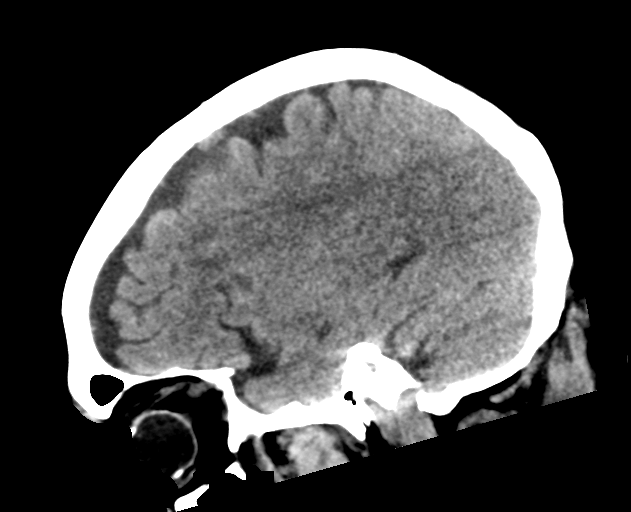

[16 of 47 positions shown; findings below may reference images not displayed]

FINDINGS: Brain: No evidence of acute infarction, hemorrhage, hydrocephalus,
extra-axial collection or mass lesion/mass effect. Mild atrophic
changes are noted.

Vascular: No hyperdense vessel or unexpected calcification.

Skull: Normal. Negative for fracture or focal lesion.

Sinuses/Orbits: No acute finding.

Other: None.
IMPRESSION: Mild atrophic changes.  No acute abnormality noted.

## 2023-06-09 IMAGING — DX DG CHEST 1V PORT
1 series · 2 of 2 positions shown · non-contrast
Comparison: September 29, 2021

CLINICAL DATA: Status post endotracheal tube placement.

EXAM:
PORTABLE CHEST 1 VIEW

[Series 1: chest · 0.14mm/px · 2 of 2 slices shown]
[im 1/2]
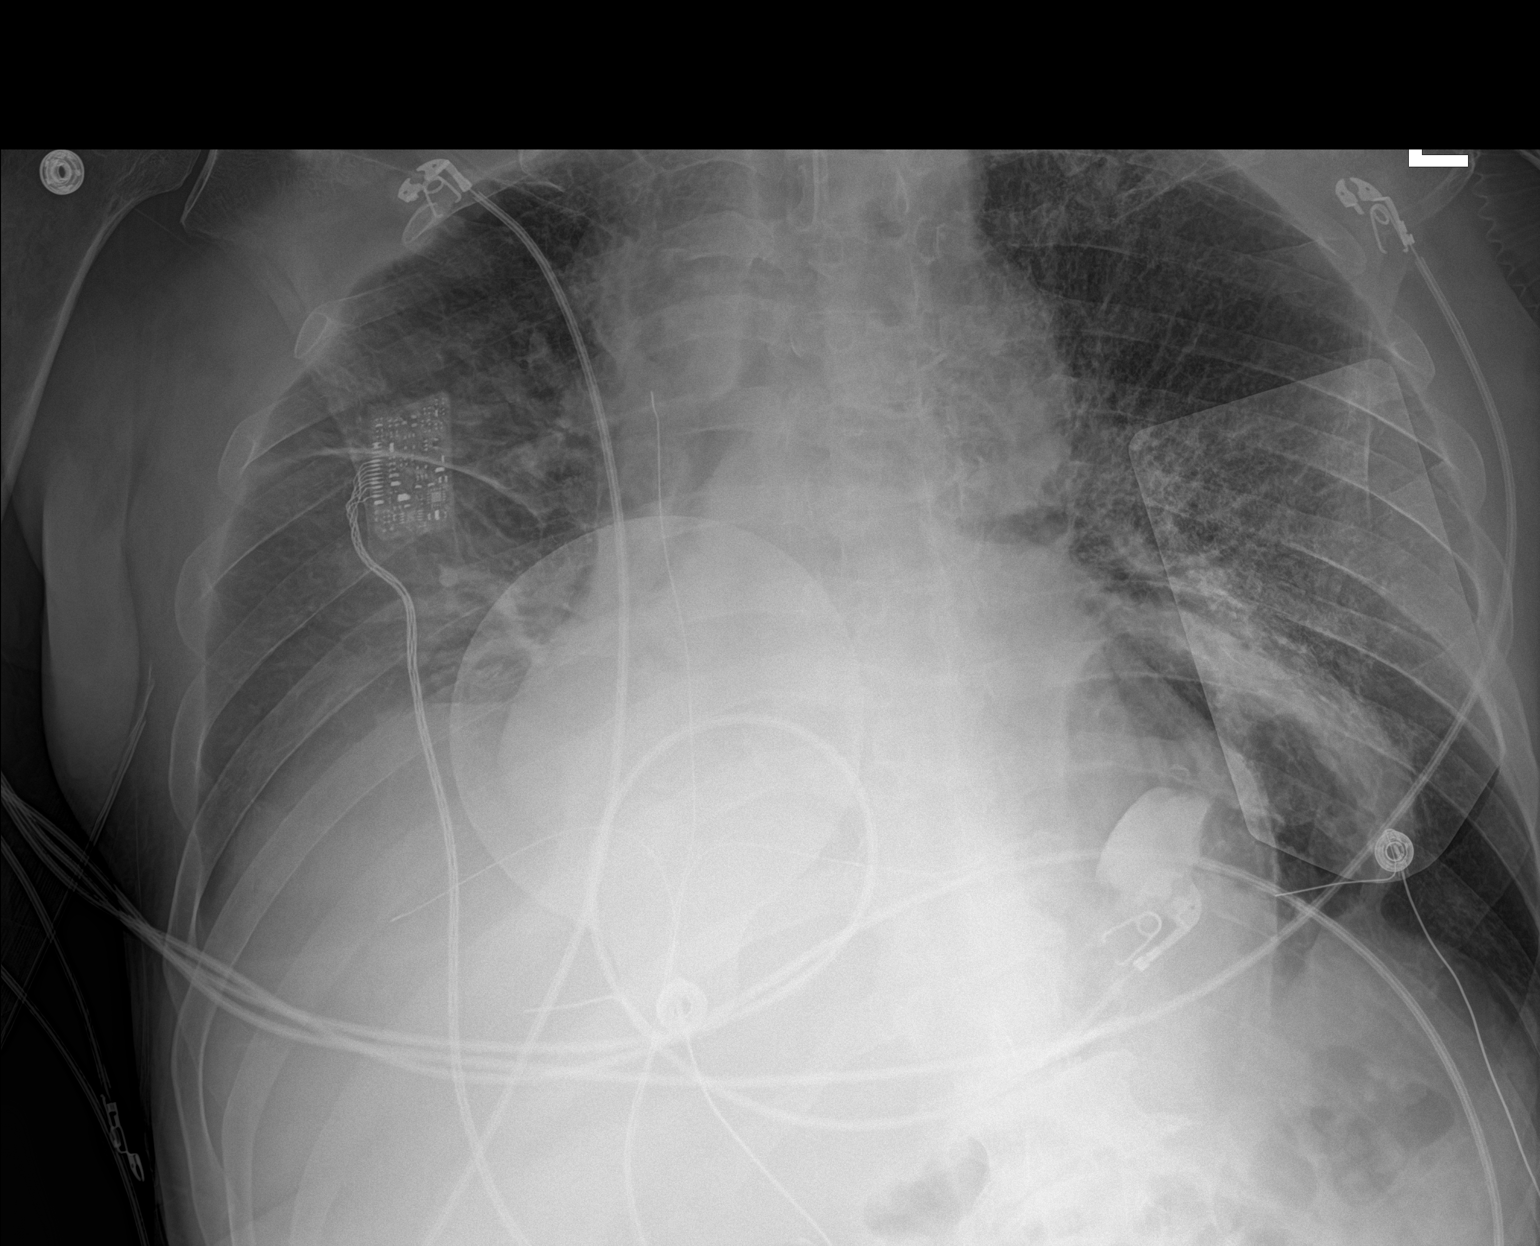
[im 2/2]
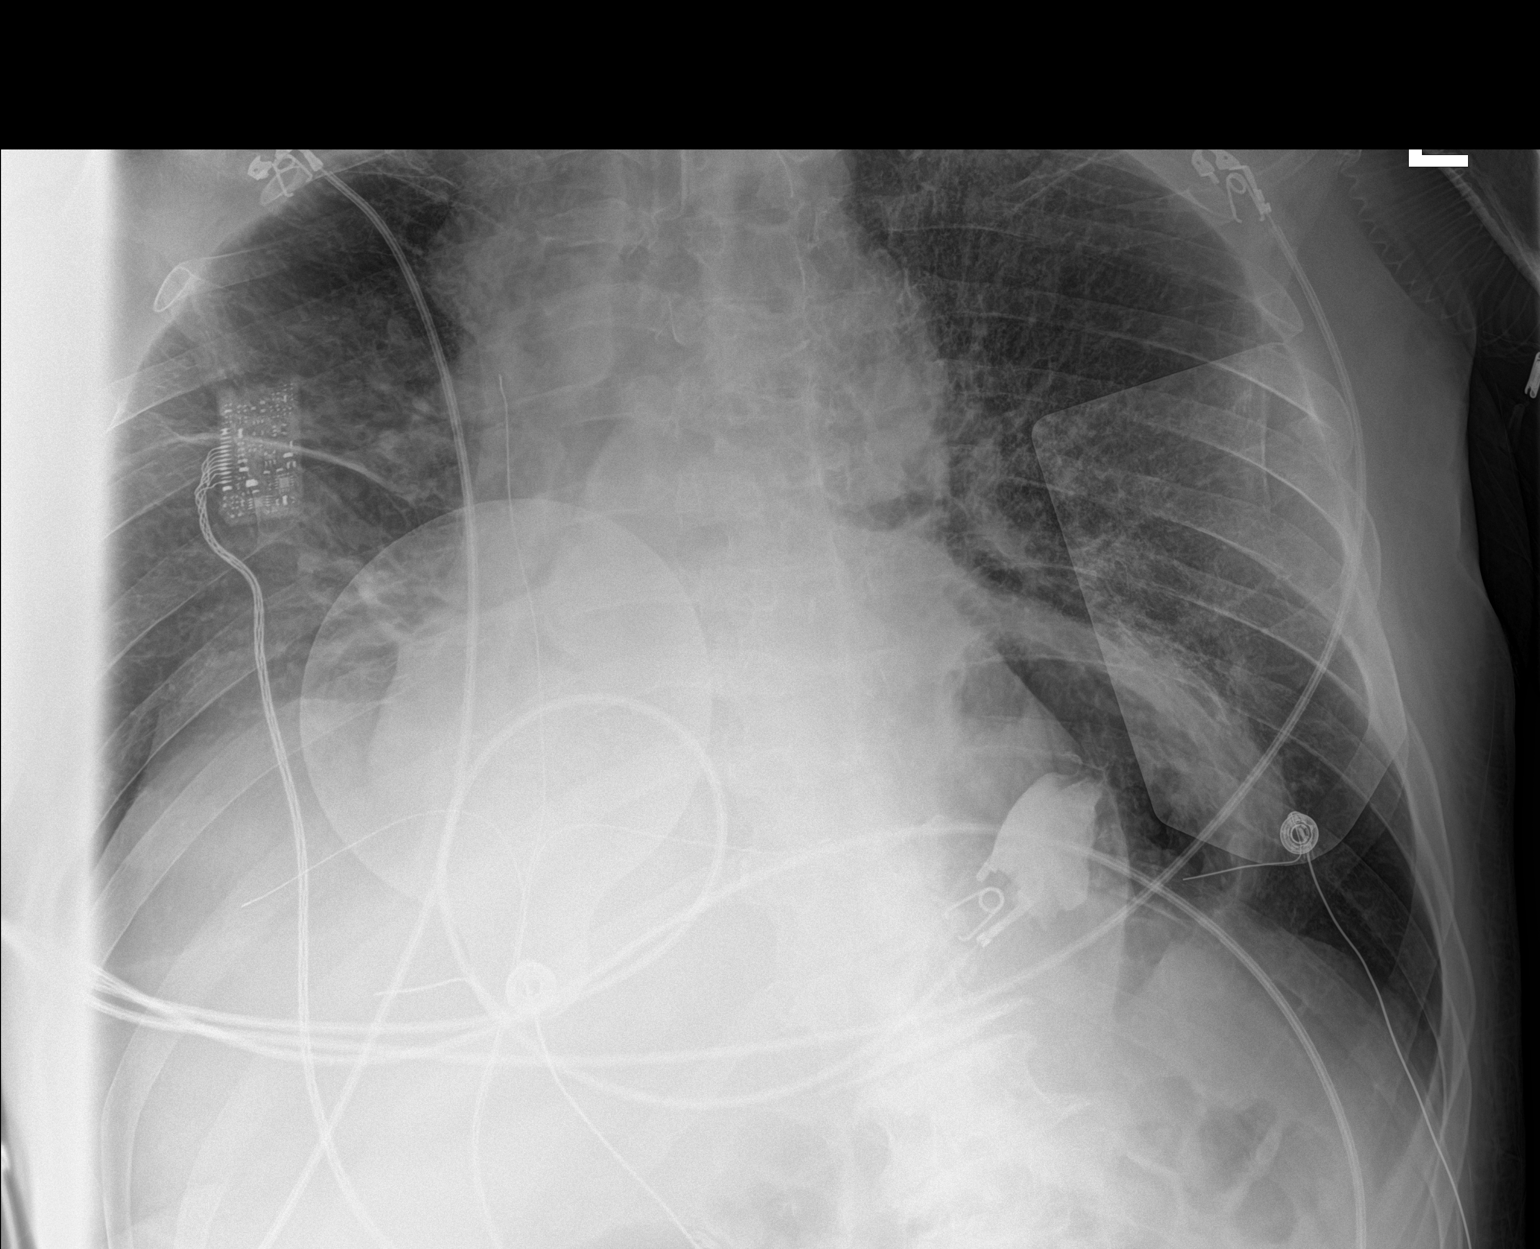

[2 of 2 positions shown; findings below may reference images not displayed]

FINDINGS: An endotracheal tube is seen with its distal tip approximately
cm from the carina. Decreased lung volumes are seen with elevation
of the right hemidiaphragm. Persistent bilateral patchy opacities
are again noted. This is mildly increased in severity when compared
to the prior study. A small right pleural effusion is noted. No
pneumothorax is identified. Gastric herniation within the
retrocardiac region of the left lung base is again seen. The cardiac
silhouette is mildly enlarged and unchanged in size. Stable widening
of the superior mediastinum is noted. The visualized skeletal
structures are unremarkable.
IMPRESSION: 1. Endotracheal tube positioning, as described above.
2. Patchy bilateral opacities, concerning for multifocal
infiltrates, mildly increased in severity when compared to the prior
study.
3. Persistent widening of the superior mediastinum. Correlation with
chest CT is recommended to exclude the presence of a mediastinal
mass.
4. Findings consistent with history of posteromedial left
diaphragmatic herniation.
5. Small right pleural effusion.
